# Patient Record
Sex: Female | Born: 1985 | Race: Black or African American | Hispanic: No | Marital: Single | Smoking: Current every day smoker
Health system: Southern US, Community
[De-identification: ages and names within clinical notes are randomized; demographics above are authoritative.]

## PROBLEM LIST (undated history)

## (undated) VITALS — BP 90/50 | HR 80 | Temp 98.0°F | Resp 18 | Ht 68.0 in | Wt 178.0 lb

## (undated) DIAGNOSIS — F419 Anxiety disorder, unspecified: Secondary | ICD-10-CM

## (undated) DIAGNOSIS — M25579 Pain in unspecified ankle and joints of unspecified foot: Secondary | ICD-10-CM

## (undated) DIAGNOSIS — F32A Depression, unspecified: Secondary | ICD-10-CM

## (undated) DIAGNOSIS — K838 Other specified diseases of biliary tract: Secondary | ICD-10-CM

## (undated) DIAGNOSIS — F4310 Post-traumatic stress disorder, unspecified: Secondary | ICD-10-CM

## (undated) DIAGNOSIS — F418 Other specified anxiety disorders: Secondary | ICD-10-CM

## (undated) DIAGNOSIS — F329 Major depressive disorder, single episode, unspecified: Secondary | ICD-10-CM

## (undated) DIAGNOSIS — R768 Other specified abnormal immunological findings in serum: Secondary | ICD-10-CM

## (undated) DIAGNOSIS — F1910 Other psychoactive substance abuse, uncomplicated: Secondary | ICD-10-CM

## (undated) DIAGNOSIS — G8929 Other chronic pain: Secondary | ICD-10-CM

## (undated) HISTORY — PX: APPENDECTOMY: SHX54

## (undated) HISTORY — PX: TUBAL LIGATION: SHX77

## (undated) HISTORY — PX: ANKLE SURGERY: SHX546

---
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
## 2004-01-05 ENCOUNTER — Inpatient Hospital Stay (HOSPITAL_COMMUNITY): Admission: AD | Admit: 2004-01-05 | Discharge: 2004-01-05 | Payer: Self-pay | Admitting: Obstetrics

## 2004-02-18 ENCOUNTER — Inpatient Hospital Stay (HOSPITAL_COMMUNITY): Admission: AD | Admit: 2004-02-18 | Discharge: 2004-02-19 | Payer: Self-pay | Admitting: Obstetrics

## 2004-03-14 ENCOUNTER — Inpatient Hospital Stay (HOSPITAL_COMMUNITY): Admission: AD | Admit: 2004-03-14 | Discharge: 2004-03-16 | Payer: Self-pay | Admitting: Obstetrics

## 2004-05-18 ENCOUNTER — Emergency Department (HOSPITAL_COMMUNITY): Admission: EM | Admit: 2004-05-18 | Discharge: 2004-05-18 | Payer: Self-pay | Admitting: Emergency Medicine

## 2004-07-01 ENCOUNTER — Encounter (INDEPENDENT_AMBULATORY_CARE_PROVIDER_SITE_OTHER): Payer: Self-pay | Admitting: *Deleted

## 2004-07-11 ENCOUNTER — Encounter: Admission: RE | Admit: 2004-07-11 | Discharge: 2004-07-11 | Payer: Self-pay | Admitting: Family Medicine

## 2004-07-22 ENCOUNTER — Encounter: Admission: RE | Admit: 2004-07-22 | Discharge: 2004-07-22 | Payer: Self-pay | Admitting: Family Medicine

## 2004-07-22 ENCOUNTER — Other Ambulatory Visit: Admission: RE | Admit: 2004-07-22 | Discharge: 2004-07-22 | Payer: Self-pay | Admitting: Family Medicine

## 2004-07-31 ENCOUNTER — Encounter: Admission: RE | Admit: 2004-07-31 | Discharge: 2004-07-31 | Payer: Self-pay | Admitting: Family Medicine

## 2004-08-12 ENCOUNTER — Ambulatory Visit: Payer: Self-pay | Admitting: Family Medicine

## 2004-09-03 ENCOUNTER — Ambulatory Visit: Payer: Self-pay | Admitting: Family Medicine

## 2004-10-21 ENCOUNTER — Ambulatory Visit: Payer: Self-pay | Admitting: Family Medicine

## 2004-10-23 ENCOUNTER — Emergency Department (HOSPITAL_COMMUNITY): Admission: EM | Admit: 2004-10-23 | Discharge: 2004-10-23 | Payer: Self-pay | Admitting: Emergency Medicine

## 2004-11-13 ENCOUNTER — Ambulatory Visit: Payer: Self-pay | Admitting: Family Medicine

## 2007-01-28 DIAGNOSIS — F329 Major depressive disorder, single episode, unspecified: Secondary | ICD-10-CM

## 2007-01-28 DIAGNOSIS — F17200 Nicotine dependence, unspecified, uncomplicated: Secondary | ICD-10-CM

## 2007-01-29 ENCOUNTER — Encounter (INDEPENDENT_AMBULATORY_CARE_PROVIDER_SITE_OTHER): Payer: Self-pay | Admitting: *Deleted

## 2007-05-12 ENCOUNTER — Inpatient Hospital Stay (HOSPITAL_COMMUNITY): Admission: AD | Admit: 2007-05-12 | Discharge: 2007-05-12 | Payer: Self-pay | Admitting: Obstetrics and Gynecology

## 2010-11-08 ENCOUNTER — Emergency Department (HOSPITAL_COMMUNITY)
Admission: EM | Admit: 2010-11-08 | Discharge: 2010-11-08 | Payer: Self-pay | Source: Home / Self Care | Admitting: Emergency Medicine

## 2011-01-17 ENCOUNTER — Emergency Department (HOSPITAL_COMMUNITY)
Admission: EM | Admit: 2011-01-17 | Discharge: 2011-01-17 | Disposition: A | Discharge status: Home / Self Care | Payer: Self-pay | Attending: Emergency Medicine | Admitting: Emergency Medicine

## 2011-01-17 DIAGNOSIS — M7989 Other specified soft tissue disorders: Secondary | ICD-10-CM | POA: Insufficient documentation

## 2011-01-17 DIAGNOSIS — IMO0002 Reserved for concepts with insufficient information to code with codable children: Secondary | ICD-10-CM | POA: Insufficient documentation

## 2011-01-17 DIAGNOSIS — L299 Pruritus, unspecified: Secondary | ICD-10-CM | POA: Insufficient documentation

## 2011-01-17 DIAGNOSIS — M79609 Pain in unspecified limb: Secondary | ICD-10-CM | POA: Insufficient documentation

## 2011-01-20 ENCOUNTER — Inpatient Hospital Stay (HOSPITAL_COMMUNITY)
Admission: EM | Admit: 2011-01-20 | Discharge: 2011-01-24 | DRG: 603 | Disposition: A | Discharge status: Home / Self Care | Payer: Non-veteran care | Attending: Internal Medicine | Admitting: Internal Medicine

## 2011-01-20 ENCOUNTER — Emergency Department (HOSPITAL_COMMUNITY)
Admission: EM | Admit: 2011-01-20 | Discharge: 2011-01-20 | Discharge status: Against Medical Advice | Payer: Non-veteran care | Attending: Emergency Medicine | Admitting: Emergency Medicine

## 2011-01-20 DIAGNOSIS — F17200 Nicotine dependence, unspecified, uncomplicated: Secondary | ICD-10-CM | POA: Diagnosis present

## 2011-01-20 DIAGNOSIS — E876 Hypokalemia: Secondary | ICD-10-CM | POA: Diagnosis present

## 2011-01-20 DIAGNOSIS — D649 Anemia, unspecified: Secondary | ICD-10-CM | POA: Diagnosis present

## 2011-01-20 DIAGNOSIS — D473 Essential (hemorrhagic) thrombocythemia: Secondary | ICD-10-CM | POA: Diagnosis present

## 2011-01-20 DIAGNOSIS — Z880 Allergy status to penicillin: Secondary | ICD-10-CM

## 2011-01-20 DIAGNOSIS — F1010 Alcohol abuse, uncomplicated: Secondary | ICD-10-CM | POA: Diagnosis present

## 2011-01-20 DIAGNOSIS — IMO0002 Reserved for concepts with insufficient information to code with codable children: Principal | ICD-10-CM | POA: Diagnosis present

## 2011-01-20 DIAGNOSIS — F1410 Cocaine abuse, uncomplicated: Secondary | ICD-10-CM | POA: Diagnosis present

## 2011-01-21 ENCOUNTER — Encounter (HOSPITAL_COMMUNITY): Payer: Self-pay | Admitting: Radiology

## 2011-01-21 ENCOUNTER — Emergency Department (HOSPITAL_COMMUNITY): Payer: Non-veteran care

## 2011-01-21 LAB — MRSA PCR SCREENING: MRSA by PCR: NEGATIVE

## 2011-01-21 LAB — CBC
HCT: 38.8 % (ref 36.0–46.0)
Hemoglobin: 13.6 g/dL (ref 12.0–15.0)
MCH: 30.7 pg (ref 26.0–34.0)
MCHC: 35.1 g/dL (ref 30.0–36.0)
MCV: 87.6 fL (ref 78.0–100.0)
RDW: 12.4 % (ref 11.5–15.5)

## 2011-01-21 LAB — POCT I-STAT, CHEM 8
Calcium, Ion: 1.1 mmol/L — ABNORMAL LOW (ref 1.12–1.32)
Creatinine, Ser: 1 mg/dL (ref 0.4–1.2)
Glucose, Bld: 113 mg/dL — ABNORMAL HIGH (ref 70–99)
HCT: 40 % (ref 36.0–46.0)
Hemoglobin: 13.6 g/dL (ref 12.0–15.0)
Potassium: 2.7 mEq/L — CL (ref 3.5–5.1)
TCO2: 21 mmol/L (ref 0–100)

## 2011-01-21 LAB — RAPID URINE DRUG SCREEN, HOSP PERFORMED
Amphetamines: NOT DETECTED
Tetrahydrocannabinol: NOT DETECTED

## 2011-01-21 LAB — DIFFERENTIAL
Basophils Absolute: 0.1 10*3/uL (ref 0.0–0.1)
Eosinophils Relative: 0 % (ref 0–5)
Lymphocytes Relative: 21 % (ref 12–46)
Monocytes Absolute: 0.9 10*3/uL (ref 0.1–1.0)
Monocytes Relative: 5 % (ref 3–12)

## 2011-01-21 MED ORDER — IOHEXOL 300 MG/ML  SOLN
100.0000 mL | Freq: Once | INTRAMUSCULAR | Status: AC | PRN
  Administered 2011-01-21: 100 mL via INTRAVENOUS

## 2011-01-22 ENCOUNTER — Inpatient Hospital Stay (HOSPITAL_COMMUNITY): Payer: Non-veteran care

## 2011-01-22 DIAGNOSIS — IMO0002 Reserved for concepts with insufficient information to code with codable children: Secondary | ICD-10-CM

## 2011-01-22 LAB — CBC
HCT: 31.6 % — ABNORMAL LOW (ref 36.0–46.0)
Hemoglobin: 11.1 g/dL — ABNORMAL LOW (ref 12.0–15.0)
Hemoglobin: 10.6 g/dL — ABNORMAL LOW (ref 12.0–15.0)
MCH: 28.9 pg (ref 26.0–34.0)
MCV: 88.8 fL (ref 78.0–100.0)
Platelets: 420 10*3/uL — ABNORMAL HIGH (ref 150–400)
RBC: 3.84 MIL/uL — ABNORMAL LOW (ref 3.87–5.11)
WBC: 17 10*3/uL — ABNORMAL HIGH (ref 4.0–10.5)
WBC: 11.8 10*3/uL — ABNORMAL HIGH (ref 4.0–10.5)

## 2011-01-22 LAB — BASIC METABOLIC PANEL
CO2: 29 mEq/L (ref 19–32)
Chloride: 103 mEq/L (ref 96–112)
Creatinine, Ser: 0.95 mg/dL (ref 0.4–1.2)
GFR calc Af Amer: >60 mL/min (ref >60)
Sodium: 143 mEq/L (ref 135–145)

## 2011-01-22 LAB — HIV ANTIBODY (ROUTINE TESTING W REFLEX): HIV: NONREACTIVE

## 2011-01-23 LAB — CBC
HCT: 30.5 % — ABNORMAL LOW (ref 36.0–46.0)
Hemoglobin: 10.1 g/dL — ABNORMAL LOW (ref 12.0–15.0)
MCH: 29.4 pg (ref 26.0–34.0)
MCHC: 33.1 g/dL (ref 30.0–36.0)
MCV: 88.9 fL (ref 78.0–100.0)
RBC: 3.43 MIL/uL — ABNORMAL LOW (ref 3.87–5.11)

## 2011-01-23 LAB — COMPREHENSIVE METABOLIC PANEL
AST: 24 U/L (ref 0–37)
BUN: 5 mg/dL — ABNORMAL LOW (ref 6–23)
CO2: 29 mEq/L (ref 19–32)
Chloride: 104 mEq/L (ref 96–112)
Creatinine, Ser: 0.79 mg/dL (ref 0.4–1.2)
GFR calc Af Amer: >60 mL/min (ref >60)
GFR calc non Af Amer: >60 mL/min (ref >60)
Glucose, Bld: 129 mg/dL — ABNORMAL HIGH (ref 70–99)
Total Bilirubin: 0.4 mg/dL (ref 0.3–1.2)

## 2011-01-23 LAB — MAGNESIUM: Magnesium: 2.3 mg/dL (ref 1.5–2.5)

## 2011-01-24 LAB — COMPREHENSIVE METABOLIC PANEL
AST: 26 U/L (ref 0–37)
Albumin: 3.1 g/dL — ABNORMAL LOW (ref 3.5–5.2)
Calcium: 9.1 mg/dL (ref 8.4–10.5)
Chloride: 102 mEq/L (ref 96–112)
Creatinine, Ser: 0.75 mg/dL (ref 0.4–1.2)
GFR calc Af Amer: >60 mL/min (ref >60)
Total Protein: 7.1 g/dL (ref 6.0–8.3)

## 2011-01-24 LAB — CBC
MCH: 29.5 pg (ref 26.0–34.0)
MCHC: 33.6 g/dL (ref 30.0–36.0)
Platelets: 481 10*3/uL — ABNORMAL HIGH (ref 150–400)
RBC: 3.76 MIL/uL — ABNORMAL LOW (ref 3.87–5.11)

## 2011-01-24 LAB — DIFFERENTIAL
Basophils Absolute: 0 10*3/uL (ref 0.0–0.1)
Basophils Relative: 0 % (ref 0–1)
Eosinophils Absolute: 0 10*3/uL (ref 0.0–0.7)
Monocytes Relative: 4 % (ref 3–12)
Neutrophils Relative %: 79 % — ABNORMAL HIGH (ref 43–77)

## 2011-01-24 NOTE — Op Note (Signed)
[name], [name]                  ACCOUNT NO.:  [id number]  MEDICAL RECORD NO.:  [id number]           PATIENT TYPE:  I  LOCATION:  5019                         FACILITY:  [facility]  PHYSICIAN:  Vanita Panda. Magnus Ivan, M.D.DATE OF BIRTH:  Mar 23, 1986  DATE OF PROCEDURE:  01/21/2011 DATE OF DISCHARGE:                              OPERATIVE REPORT   PREOPERATIVE DIAGNOSIS:  Right arm antecubital fossa abscess.  POSTOPERATIVE DIAGNOSIS:  Right arm antecubital fossa abscess.  PROCEDURE:  Irrigation and debridement of right arm antecubital fossa abscess with exploration of surrounding structures.  SURGEON:  Vanita Panda. Magnus Ivan, MD  ANESTHESIA:  General.  ESTIMATED BLOOD LOSS:  Less than 50 mL.  TOURNIQUET TIME:  30 minutes.  COMPLICATIONS:  None.  INDICATIONS:  [name] is a 25 year old female with the history of polysubstance abuse.  She had been injecting narcotics in to her right arm at the antecubital fossa for a while now and has developed an abscess in this area.  A CT scan showed large soft tissue abscess as well.  She had severe pain and white blood cell count of 18,000 peripherally.  It was recommended that she undergo urgent irrigation and debridement of the antecubital fossa.  I explained the risks and benefits of this to her in detail, and she understood the reason behind proceeding with surgery and understood this was from infection from her IV drugs.  She is on the hospitalist service and already receiving IV antibiotics.  PROCEDURE DESCRIPTION:  After informed consent was obtained, appropriate right arm was marked.  She was brought to the operating room, placed supine on the operating table.  General anesthesia was then obtained. The right arm was placed on the arm table.  Her right arm was prepped and draped with DuraPrep and sterile drapes.  A sterile tourniquet was also applied.  A time-out was called and she identified the correct patient and correct  right arm.  I had the tourniquet elevated to 250 mmHg.  I then made incision directly over the antecubital fossa in the transverse direction and carried this approximately medial and distal lateral.  A large soft tissue abscess was encountered and cultures were obtained.  I then further divided the tissue to expose the biceps tendon, which was intact.  I did not find any necrotic muscle yet and did find a large area of abscess.  The median nerve was intact as well as the brachial artery.  Using pulsatile lavage, I then thoroughly lavaged the entire antecubital fossa with the pulsatile lavage including the skin superficial and deep tissues.  I then used further irrigation with bulb syringe so at least 4 L of solution was irrigated through this area.  I then loosely reapproximated the skin.  Xeroform followed well- padded sterile dressing was applied.  Of note, she did show any evidence of compartment syndrome preoperatively.  She was awakened, extubated, and taken to the recovery room in stable condition.  Postoperatively, she remained on IV antibiotics with close observation and potentially return trip to the OR in 24-48 hours if this is worsening in any way.  Vanita Panda. Magnus Ivan, M.D.     CYB/MEDQ  D:  01/21/2011  T:  01/22/2011  Job:  [id number]  Electronically Signed by Doneen Poisson M.D. on 01/24/2011 08:15:54 PM

## 2011-01-24 NOTE — Op Note (Signed)
  [name], [name]                  ACCOUNT NO.:  [id number]  MEDICAL RECORD NO.:  [id number]           PATIENT TYPE:  I  LOCATION:  5019                         FACILITY:  [facility]  PHYSICIAN:  Vanita Panda. Magnus Ivan, M.D.DATE OF BIRTH:  [birth year]  DATE OF PROCEDURE:  01/23/2011 DATE OF DISCHARGE:                              OPERATIVE REPORT   PREOPERATIVE DIAGNOSIS:  Right arm antecubital fossa abscess, status post irrigation and debridement x1.  POSTOPERATIVE DIAGNOSIS:  Right arm antecubital fossa abscess, status post irrigation and debridement x1.  PROCEDURE:  Repeat irrigation and debridement of right arm wound and abscess in the antecubital fossa.  FINDINGS:  Minimal amount of purulent material in antecubital fossa, much improved from previous I and Ds.  SURGEON:  Vanita Panda. Magnus Ivan, MD  ANESTHESIA:  General.  ESTIMATED BLOOD LOSS:  Minimal.  COMPLICATIONS:  None.  INDICATIONS:  [name] is a 25 year old patient who taken to the operating room on January 21, 2011, after being found to have abscess in antecubital fossa secondary to IV drug abuse.  She has now been in the hospital for few days on IV antibiotics as this did grew out gram positive cocci.  We returned to the OR today and her white blood cell count is improved dramatically.  She is still having pain this area and we need to look at this one more time due to the gross infection that we found the first time.  PROCEDURE DESCRIPTION:  After informed consent was obtained, appropriate right arm was marked.  She was brought to the operating room, placed supine on the operating table.  The right arm was placed on the arm table.  General anesthesia was then obtained.  Her right arm was prepped and draped with DuraPrep and sterile drapes.  A time-out was called and she identified the correct patient and correct right arm.  I removed the sutures in the antecubital fossa and opened up wound  extensively. Again, I did not find significant necrosis, but I still found some slight milky material suggesting continued infection.  Using pulsatile lavage, I lavaged 3 L of normal saline solution through the wound followed by bulb syringe irrigation of 500 mL of bacitracin solution.  I then loosely reapproximated the skin again with interrupted 2-0 nylon suture. Xeroform followed well-padded sterile dressing was applied.  The patient was awakened, extubated, and taken to the recovery room in stable condition.  All final counts were correct and there were no complications noted.     Vanita Panda. Magnus Ivan, M.D.     CYB/MEDQ  D:  01/23/2011  T:  01/24/2011  Job:  [id number]  Electronically Signed by Doneen Poisson M.D. on 01/24/2011 08:15:56 PM

## 2011-01-25 LAB — CULTURE, ROUTINE-ABSCESS

## 2011-01-25 NOTE — Discharge Summary (Signed)
NAMEJARED, [name]                  ACCOUNT NO.:  [id number]  MEDICAL RECORD NO.:  [id number]           PATIENT TYPE:  I  LOCATION:  5019                         FACILITY:  [facility]  PHYSICIAN:  Talmage Nap, MD  DATE OF BIRTH:  Mar 15, 1986  DATE OF ADMISSION:  01/20/2011 DATE OF DISCHARGE:  01/24/2011                        DISCHARGE SUMMARY - REFERRING   PRIMARY CARE PHYSICIAN:  Unassigned.  CONSULTANT:  Involving the case is Orthopedic Surgery, Vanita Panda. [name], M.D.  DISCHARGE DIAGNOSES: 1. Right antecubital fossa abscess and cellulitis, status post     irrigation and debridement with exploration of surrounding tissue     x2. 2. Polysubstance abuse. 3. History of posttraumatic stress disorder. 4. Anemia. 5. Thrombocytosis most likely secondary to anemia.  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  This was said to be associated with fever and chills.  Symptoms were said to be getting progressively worse, hence the patient presented to the emergency room to be evaluated.  Her preadmission med include doxycycline.  ALLERGIES:  PENICILLIN.  PAST SURGICAL HISTORY:  Right ankle surgery.  SOCIAL HISTORY:  The patient smokes about a pack of cigarettes per day and does street drugs, which includes cocaine.  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  FAMILY HISTORY:  Negative for any systemic illness.  REVIEW OF SYSTEMS:  Essentially documented in the initial history and physical.  PHYSICAL EXAMINATION:  At time the patient was seen by the admitting physician, VITAL SIGNS:  Temperature was 99.1, blood pressure 148/82, pulse rate 106, respiratory rate 18, and saturating 98% on room air. HEENT:  Pupils were reactive to light and extraocular muscles intact and she had mild  pallor. NECK:  No jugular venous distention.  No carotid bruit.  No lymphadenopathy. CHEST:  Clear to auscultation. HEART:  Sounds are one and two. ABDOMEN:  Soft, nontender.  Liver, spleen, and kidney not palpable. Bowel sounds are positive. EXTREMITIES:  No pedal edema. NEUROLOGIC:  Nonfocal. MUSCULOSKELETAL:  Swelling in the right antecubital fossa with a lot of induration and tenderness with decreased range of movement. NEUROPSYCHIATRIC:  Unremarkable.  LABORATORY DATA:  Initial complete blood count and differential showed WBC of 18.2, hemoglobin of 13.6, hematocrit of 38.8, MCV 87.6 with a platelet count of 457.  Chem-8 stat showed sodium of 138, potassium of 2.7, chloride of 101, glucose is 113, BUN is 3, and creatinine is 1.0. Urine drug screen was positive for cocaine and opiates.  Routine MRSA screening negative.  Complete blood count with differential done on January 22, 2011, showed WBC of 17.0, hemoglobin of 11.1, hematocrit of 33.7, MCV 87.8 with a platelet count of 420 and a basic metabolic panel showed sodium of 143, potassium of 3.8, chloride of 103 with a bicarb of 29, glucose is 99, BUN is 3, creatinine is 0.95.  Blood culture no growth x2.  Culture from the right elbow abscess grew gram-positive cocci in pairs.  No anaerobe seen.  HIV test, nonreactive.  RPR nonreactive.  A repeat complete blood count with differential done on January 24, 2011, showed WBC of 13.4, hemoglobin 11.1, hematocrit 33.0, MCV 87.8 with a platelet count of 481.  A comprehensive metabolic panel showed sodium of 138, potassium of 4.1, chloride of 102 with a bicarb of 27, glucose is 147, BUN is 7, creatinine 0.75, and magnesium level is 2.2.  IMAGING STUDIES:  Done include CT of the right elbow, which showed abscess involving the subcutaneous tissue as well as the brachioradialis muscles and the cephalic vein with extensive adjacent cellulitis.  Chest x-ray showed PICC-line over cavoatrial  junction.  HOSPITAL COURSE:  The patient was admitted to a general medical floor. She was started on IV antibiotics, IV vancomycin, cefepime as well as clindamycin and dosing was done by pharmacy.  Pain control was done with morphine 2-4 mg IV q.4 h. p.r.n.  Orthopedic surgeon was consulted, Dr. Doneen Poisson evaluated the patient and subsequently the patient had incision and drainage done x2.  Postoperatively, the patient had thin bandage applied to the left arm and this was elevated.  She was followed and evaluated on daily basis and makes remarkable progress i.e. swelling was reduced and WBC count showed down trend.  So far, the patient has remained clinically stable.  She was reevaluated by the orthopedic surgeon who recommended that the patient should be discharged.  The patient was also seen by me today.  Denies any complaint.  Swelling in the right elbow is reduced.  Vital signs, stable i.e. temperature is 98.9, pulse is 113, respiratory 18, blood pressure is 153/74 repeat was 138/74, medically stable.  Plan is for the patient to be discharged home today on activity as tolerated.  Low-sodium, low- cholesterol diet.  Alcohol, smoking and street drug use cessation. She will be followed up by Dr. Doneen Poisson of [facility] on January 09, 2011.  She will call for appointment, phone number is [phone].  Dressing would be removed in 2 days and then a large bandage with gauze will be placed over the incision.  DISCHARGE MEDICATIONS:  Medication to be taken at home will include, 1. Doxycycline 100 mg one p.o. b.i.d. given by orthopedic surgeon. 2. Percocet 5/325 one to two tablets p.o. q.4-6 h. p.r.n. given by     orthopedic surgeon. 3. Motrin 800 mg one p.o. b.i.d./t.i.d. with meals also given by     orthopedic surgery. 4. However, the patient will also be given Bactrim double strength 1     p.o. b.i.d. for the next 14 days.  A copy of this discharge  summary will be made available to the patient's primary care physician.    Talmage Nap, MD    CN/MEDQ  D:  01/24/2011  T:  01/24/2011  Job:  [id number]  Electronically Signed by Talmage Nap  on 01/25/2011 06:32:41 AM

## 2011-01-26 LAB — ANAEROBIC CULTURE

## 2011-01-27 LAB — CULTURE, BLOOD (ROUTINE X 2)
Culture  Setup Time: 201202211056
Culture: NO GROWTH

## 2011-02-06 NOTE — H&P (Signed)
[name], [name]                  ACCOUNT NO.:  [id number]  MEDICAL RECORD NO.:  [id number]           PATIENT TYPE:  LOCATION:                                 FACILITY:  PHYSICIAN:  Lonia Blood, M.D.      DATE OF BIRTH:  [birth year]  DATE OF ADMISSION:  01/21/2011 DATE OF DISCHARGE:                             HISTORY & PHYSICAL   PRIMARY CARE PHYSICIAN:  She is unassigned to [place], she goes to [place].  PRESENTING COMPLAINT:  Right elbow pain and swelling.  HISTORY OF PRESENT ILLNESS:  The patient is a 25 year old veteran with history of polysubstance abuse that presented with swelling, tenderness, and fever.  The swelling and tenderness involve her right elbow.  She has agreed to using a cane.  Also other IV drugs.  She noted the swelling started over 2 days ago, has progressively gotten worse, pain is 10/10, worsen with any movement, not relieved by anything.  She has also had fevers and chills.  She denied any prior history of similar findings.  PAST MEDICAL HISTORY:  Significant for: 1. Post-traumatic stress disorder. 2. History of right ankle surgery.  ALLERGIES:  PENICILLIN.  CURRENT MEDICATIONS:  She was started on doxycycline as an outpatient which does not seem to be helping her.  SOCIAL HISTORY:  The patient is an [place] [employer], clinical.  She smokes about a half pack per day, uses cocaine as well as other alcohol.  She also uses IV drugs.  She is an occasional alcohol drinker.  FAMILY HISTORY:  Denied any family history for substance abuse.  REVIEW OF SYSTEMS:  All systems reviewed are currently negative except per HPI.  PHYSICAL EXAMINATION:  VITAL SIGNS:  Temperature is 99.1, blood pressure 148/82, pulse 116, respiratory rate 18, her sats 98% on room air. GENERAL:  She is awake, alert, in obvious distress due to pain. HEENT:  PERRL, EOMI.  No pallor.  No jaundice.  No rhinorrhea. NECK:  Supple.  No JVD.  No lymphadenopathy. RESPIRATORY:  She has good air entry  bilaterally.  No wheezes, no rales, no crackles. CARDIOVASCULAR SYSTEM:  She has S1, S2.  No murmur. ABDOMEN:  Soft full, nontender with positive bowel sounds. EXTREMITIES:  No edema, cyanosis, or clubbing.  Her right elbow is markedly swollen, tender with an indurated area anterolaterally, tender to touch, red and warm.  Not easily fluctuant. SKIN:  Otherwise no rashes or ulcers.  LABORATORY DATA:  Her white count is 18.2 with a left shift ANC of 13.4, hemoglobin 13.6, platelet 457.  Calcium is 1.1.  Hemoglobin 13.6. Sodium 138, potassium 2.7, chloride 101, CO2 of 21, glucose 113, BUN 3, and creatinine 1.0.  Urine drug screen is positive for cocaine and opiates.  CT of the right elbow now showed 20 x 28-mm peripheral enhancing fluid collection within the antecubital fossa consistent with early abscess, also evidence of cellulitis.  ASSESSMENT:  This a 25 year old female with history of drug abuse, presenting with right elbow cellulitis with abscess as well as hypokalemia while positive for cocaine and opiates.  PLAN: 1. Right elbow cellulitis abscess.  We  will consult Orthopedics for     possible I and D.  In the meantime, I will start her on vancomycin     and also add cefepime since she is PENICILLIN allergic.  Pain     control, we will elevate the arm and probably warm compress. 2. Hypokalemia.  We will replete her potassium appropriately. 3. History of PTSD.  She seems stable not on any medication at this     point. 4. Polysubstance abuse.  The patient in tears and says that she will     require help and she will need help, she will quit use.  We will     continue with cessation counseling.     Lonia Blood, M.D.     Verlin Grills  D:  01/21/2011  T:  01/21/2011  Job:  [id number]  Electronically Signed by Lonia Blood M.D. on 02/06/2011 06:30:37 AM

## 2011-02-11 LAB — URINALYSIS, ROUTINE W REFLEX MICROSCOPIC
Glucose, UA: NEGATIVE mg/dL
Hgb urine dipstick: NEGATIVE
Specific Gravity, Urine: 1.016 (ref 1.005–1.030)
pH: 6 (ref 5.0–8.0)

## 2011-02-11 LAB — URINE MICROSCOPIC-ADD ON

## 2011-02-11 LAB — WET PREP, GENITAL: Yeast Wet Prep HPF POC: NONE SEEN

## 2011-02-11 LAB — GC/CHLAMYDIA PROBE AMP, GENITAL: Chlamydia, DNA Probe: POSITIVE — AB

## 2011-04-18 NOTE — Op Note (Signed)
[place]  Patient:    [name], [name]                    MRN: [id number] Proc. Date: 09/11/00 Adm. Date:  [id number] Disc. Date: [id number] Attending:  [place] Pho Damodar CC:         Dr. Tresa Garter  Dr. Judeen Hammans at [facility]  Dr. Rudene Re at Radiology Department   Operative Report  PREOPERATIVE DIAGNOSIS:  Acute abdomen, possible appendicitis.  POSTOPERATIVE DIAGNOSIS: 1.  Early appendicitis of distal end of appendix. 2.  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 3.  Few ccs of free blood in the pelvic cavity.  OPERATION PERFORMED:  Laparoscopic appendectomy.  SURGEON:  Dr. Levie Heritage.  ASSISTANT:  Dr. Leeanne Mannan.  ANESTHESIA:  General endotracheal.  OPERATIVE INDICATION:  This 25 year old girl was admitted with about 12 hours history of persistent bilious vomiting, abdominal pain, and few fainting spells.  There was no history of URI, no dysuria, no vaginal discharge, and no abdominal trauma.  Her last period started today.  Abdominal examination showed lower abdominal tenderness without any guarding or rigidity.  Her white count was 19,400 with shift to the left.  Urinalysis showed a few WBCs and RBCs.  CT scan of the abdomen revealed possible appendicitis with fluid in the pelvic cavity.  Diagnosis of possible early appendicitis was made and the patient was taken to the operating room.  OPERATIVE FINDINGS:  Laparoscopic exploration reveals small quantity of bloody fluid in the pelvic cavity.  The uterus and the tubes appear normal.  The right ovary was normal.  The left ovary showed a small indentation in the capsule with reddish floored abscess consistent with recently ruptured graafian follicle.  There was no evidence of gross inflammation in relation to the tubes and ovaries.  Examination of the appendix area showed distal one inch of the appendix edematous, somewhat turbid and congested consistent with early  appendicitis of the distal end.  The remainder of the appendix, cecum, distal ileum were unremarkable.  Examination of the distal ileum showed no evidence of Meckels diverticulum.  OPERATIVE PROCEDURE:  Under satisfactory general endotracheal anesthesia, the patient was in the supine position, abdomen was prepped and draped in the usual manner.  Insular umbilical horizontal incision was made and carried through the left of abdominal wall and peritoneal cavity was entered with some difficulty.  Hasson laparoscopic port was introduced through this incision and telescope was inserted.  Careful examination showed the findings as described above.  At this time, a 5 mm port was placed through the right midabdominal area through which a grasper was passed and the appendix was held under tension and a 10 mm port was placed through the left lower quadrant area through which another dissector was passed and appendical mesentery was dissected to free the base of the appendix from the mesentery.  There were a few other adhesions in connection with the appendix and the parietal peritoneum which were lysed.  Now the appendical base was clamped and cut with stapler.  The appendix was held once again under traction and the appendical mesentery likewise was cut with stapler; hemostasis was satisfactory.  The old blood the pelvic cavity was aspirated and collected for culture examination.  The area was irrigated and checked for hemostasis.  Examination of the distal ileum as well as once again the pelvic cavity was carried out and no other pathological lesions were noted.  All of the free  fluid was aspirated and the ports were removed.  All the wounds were closed in layers, the deep layers with 2-0 Vicryl and interrupted sutures, subcutaneous tissues closed with 3-0 Vicryl and the skin was closed with staples, and appropriate dressings were applied.  Throughout the procedure, the patients vital  signs remained stable.  The patient tolerated the procedure well and was transferred to the recovery room in satisfactory general condition. DD:  09/11/00 TD:  09/13/00 Job: [id number]

## 2011-09-18 LAB — URINALYSIS, ROUTINE W REFLEX MICROSCOPIC
Glucose, UA: NEGATIVE
Ketones, ur: NEGATIVE
Nitrite: NEGATIVE
Protein, ur: NEGATIVE
pH: 5.5

## 2011-12-15 ENCOUNTER — Encounter (HOSPITAL_COMMUNITY): Payer: Self-pay | Admitting: *Deleted

## 2011-12-15 ENCOUNTER — Emergency Department (HOSPITAL_COMMUNITY)
Admission: EM | Admit: 2011-12-15 | Discharge: 2011-12-16 | Disposition: A | Discharge status: Home / Self Care | Payer: Non-veteran care | Attending: Emergency Medicine | Admitting: Emergency Medicine

## 2011-12-15 DIAGNOSIS — Z202 Contact with and (suspected) exposure to infections with a predominantly sexual mode of transmission: Secondary | ICD-10-CM

## 2011-12-15 DIAGNOSIS — Z9189 Other specified personal risk factors, not elsewhere classified: Secondary | ICD-10-CM | POA: Insufficient documentation

## 2011-12-15 DIAGNOSIS — A499 Bacterial infection, unspecified: Secondary | ICD-10-CM | POA: Insufficient documentation

## 2011-12-15 DIAGNOSIS — L02414 Cutaneous abscess of left upper limb: Secondary | ICD-10-CM

## 2011-12-15 DIAGNOSIS — IMO0002 Reserved for concepts with insufficient information to code with codable children: Secondary | ICD-10-CM | POA: Insufficient documentation

## 2011-12-15 DIAGNOSIS — B9689 Other specified bacterial agents as the cause of diseases classified elsewhere: Secondary | ICD-10-CM | POA: Insufficient documentation

## 2011-12-15 DIAGNOSIS — N760 Acute vaginitis: Secondary | ICD-10-CM | POA: Insufficient documentation

## 2011-12-15 DIAGNOSIS — M7989 Other specified soft tissue disorders: Secondary | ICD-10-CM | POA: Insufficient documentation

## 2011-12-15 LAB — URINALYSIS, ROUTINE W REFLEX MICROSCOPIC
Ketones, ur: 15 mg/dL — AB
Nitrite: NEGATIVE
Specific Gravity, Urine: 1.021 (ref 1.005–1.030)
Urobilinogen, UA: 1 mg/dL (ref 0.0–1.0)
pH: 5.5 (ref 5.0–8.0)

## 2011-12-15 LAB — URINE MICROSCOPIC-ADD ON

## 2011-12-15 NOTE — ED Notes (Signed)
The pt has multiple symptoms.  She has an infection in her lt forearm red draining..  C/o chest pain  Migraine headache and she thinsk she may hjave a vaginal infection.  Symptoms for 2 weeks intermittently

## 2011-12-16 LAB — WET PREP, GENITAL: Yeast Wet Prep HPF POC: NONE SEEN

## 2011-12-16 LAB — URINE CULTURE
Colony Count: 80000
Culture  Setup Time: 201301150449

## 2011-12-16 MED ORDER — DOXYCYCLINE HYCLATE 100 MG PO CAPS: 100.0000 mg | ORAL_CAPSULE | Freq: Two times a day (BID) | ORAL | Status: AC

## 2011-12-16 MED ORDER — LIDOCAINE HCL 2 % IJ SOLN
10.0000 mL | Freq: Once | INTRAMUSCULAR | Status: AC
  Administered 2011-12-16: 200 mg via INTRADERMAL
  Filled 2011-12-16: qty 1

## 2011-12-16 MED ORDER — OXYCODONE-ACETAMINOPHEN 5-325 MG PO TABS
1.0000 | ORAL_TABLET | Freq: Once | ORAL | Status: AC
  Administered 2011-12-16: 1 via ORAL
  Filled 2011-12-16: qty 1

## 2011-12-16 MED ORDER — OXYCODONE-ACETAMINOPHEN 5-325 MG PO TABS: 2.0000 | ORAL_TABLET | ORAL | Status: AC | PRN

## 2011-12-16 MED ORDER — CEFTRIAXONE SODIUM 250 MG IJ SOLR
250.0000 mg | Freq: Once | INTRAMUSCULAR | Status: AC
  Administered 2011-12-16: 250 mg via INTRAMUSCULAR
  Filled 2011-12-16: qty 250

## 2011-12-16 MED ORDER — DOXYCYCLINE HYCLATE 100 MG PO TABS
100.0000 mg | ORAL_TABLET | Freq: Once | ORAL | Status: AC
  Administered 2011-12-16: 100 mg via ORAL
  Filled 2011-12-16: qty 1

## 2011-12-16 MED ORDER — OXYCODONE-ACETAMINOPHEN 5-325 MG PO TABS
2.0000 | ORAL_TABLET | Freq: Once | ORAL | Status: AC
  Administered 2011-12-16: 2 via ORAL
  Filled 2011-12-16: qty 2

## 2011-12-16 MED ORDER — METRONIDAZOLE 500 MG PO TABS: 500.0000 mg | ORAL_TABLET | Freq: Two times a day (BID) | ORAL | Status: AC

## 2011-12-16 MED ORDER — LIDOCAINE HCL (PF) 1 % IJ SOLN
2.0000 mL | Freq: Once | INTRAMUSCULAR | Status: AC
  Administered 2011-12-16: 2 mL
  Filled 2011-12-16: qty 5
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
## 2011-12-16 NOTE — ED Notes (Signed)
Received pt. From triage pt. Alert and oriented, NAD noted

## 2011-12-16 NOTE — ED Notes (Signed)
Pt. Discharged to home, pt. Alert and oriented, NAD noted 

## 2012-02-29 ENCOUNTER — Encounter (HOSPITAL_COMMUNITY): Payer: Self-pay

## 2012-02-29 ENCOUNTER — Emergency Department (HOSPITAL_COMMUNITY)
Admission: EM | Admit: 2012-02-29 | Discharge: 2012-02-29 | Disposition: A | Discharge status: Home / Self Care | Payer: Non-veteran care | Attending: Emergency Medicine | Admitting: Emergency Medicine

## 2012-02-29 DIAGNOSIS — S0083XA Contusion of other part of head, initial encounter: Secondary | ICD-10-CM

## 2012-02-29 DIAGNOSIS — R51 Headache: Secondary | ICD-10-CM | POA: Insufficient documentation

## 2012-02-29 DIAGNOSIS — S1093XA Contusion of unspecified part of neck, initial encounter: Secondary | ICD-10-CM | POA: Insufficient documentation

## 2012-02-29 DIAGNOSIS — F17200 Nicotine dependence, unspecified, uncomplicated: Secondary | ICD-10-CM | POA: Insufficient documentation

## 2012-02-29 DIAGNOSIS — S0003XA Contusion of scalp, initial encounter: Secondary | ICD-10-CM | POA: Insufficient documentation

## 2012-02-29 MED ORDER — HYDROCODONE-ACETAMINOPHEN 5-325 MG PO TABS: ORAL_TABLET | ORAL | Status: DC

## 2012-02-29 MED ORDER — HYDROCODONE-ACETAMINOPHEN 5-325 MG PO TABS
1.0000 | ORAL_TABLET | Freq: Once | ORAL | Status: AC
  Administered 2012-02-29: 1 via ORAL
  Filled 2012-02-29: qty 1
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
## 2012-03-14 ENCOUNTER — Emergency Department (HOSPITAL_COMMUNITY)
Admission: EM | Admit: 2012-03-14 | Discharge: 2012-03-14 | Disposition: A | Discharge status: Home / Self Care | Payer: Non-veteran care | Attending: Emergency Medicine | Admitting: Emergency Medicine

## 2012-03-14 ENCOUNTER — Emergency Department (HOSPITAL_COMMUNITY): Payer: Non-veteran care

## 2012-03-14 ENCOUNTER — Encounter (HOSPITAL_COMMUNITY): Payer: Self-pay | Admitting: *Deleted

## 2012-03-14 DIAGNOSIS — S0083XA Contusion of other part of head, initial encounter: Secondary | ICD-10-CM | POA: Insufficient documentation

## 2012-03-14 DIAGNOSIS — S0003XA Contusion of scalp, initial encounter: Secondary | ICD-10-CM | POA: Insufficient documentation

## 2012-03-14 DIAGNOSIS — F341 Dysthymic disorder: Secondary | ICD-10-CM | POA: Insufficient documentation

## 2012-03-14 DIAGNOSIS — R110 Nausea: Secondary | ICD-10-CM | POA: Insufficient documentation

## 2012-03-14 HISTORY — DX: Major depressive disorder, single episode, unspecified: F32.9

## 2012-03-14 HISTORY — DX: Anxiety disorder, unspecified: F41.9

## 2012-03-14 HISTORY — DX: Depression, unspecified: F32.A

## 2012-03-14 HISTORY — DX: Post-traumatic stress disorder, unspecified: F43.10

## 2012-03-14 HISTORY — DX: Other chronic pain: G89.29

## 2012-03-14 HISTORY — DX: Pain in unspecified ankle and joints of unspecified foot: M25.579

## 2012-03-14 MED ORDER — IBUPROFEN 800 MG PO TABS: 800.0000 mg | ORAL_TABLET | Freq: Three times a day (TID) | ORAL | Status: AC | PRN

## 2012-03-14 MED ORDER — HYDROCODONE-ACETAMINOPHEN 5-325 MG PO TABS
1.0000 | ORAL_TABLET | Freq: Once | ORAL | Status: AC
  Administered 2012-03-14: 1 via ORAL
  Filled 2012-03-14: qty 1

## 2012-03-14 NOTE — Discharge Instructions (Signed)
Continue to use cold compresses on your face while you heal.  Take the prescribed antiinflammatories as needed for pain.  You may return to the ER at any time for worsening condition or any new symptoms that concern you.  Contusion A contusion is a deep bruise. Contusions happen when an injury causes bleeding under the skin. Signs of bruising include pain, puffiness (swelling), and discolored skin. The contusion may turn blue, purple, or yellow. HOME CARE   Put ice on the injured area.   Put ice in a plastic bag.   Place a towel between your skin and the bag.   Leave the ice on for 15 to 20 minutes, 3 to 4 times a day.   Only take medicine as told by your doctor.   Rest the injured area.   If possible, raise (elevate) the injured area to lessen puffiness.  GET HELP RIGHT AWAY IF:   You have more bruising or puffiness.   You have pain that is getting worse.   Your puffiness or pain is not helped by medicine.  MAKE SURE YOU:   Understand these instructions.   Will watch your condition.   Will get help right away if you are not doing well or get worse.  Document Released: 05/05/2008 Document Revised: 11/06/2011 Document Reviewed: 09/22/2011 [facility] Patient Information 2012 [place], [place].
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
## 2012-03-16 NOTE — ED Provider Notes (Signed)
Medical screening examination/treatment/procedure(s) were performed by non-physician practitioner and as supervising physician I was immediately available for consultation/collaboration.  Kalissa Grays, MD 03/16/12 0847 

## 2012-03-19 ENCOUNTER — Emergency Department (HOSPITAL_COMMUNITY)
Admission: EM | Admit: 2012-03-19 | Discharge: 2012-03-19 | Disposition: A | Discharge status: Home / Self Care | Payer: Non-veteran care | Attending: Emergency Medicine | Admitting: Emergency Medicine

## 2012-03-19 ENCOUNTER — Encounter (HOSPITAL_COMMUNITY): Payer: Self-pay | Admitting: Emergency Medicine

## 2012-03-19 DIAGNOSIS — H60399 Other infective otitis externa, unspecified ear: Secondary | ICD-10-CM | POA: Insufficient documentation

## 2012-03-19 DIAGNOSIS — H9210 Otorrhea, unspecified ear: Secondary | ICD-10-CM | POA: Insufficient documentation

## 2012-03-19 DIAGNOSIS — F341 Dysthymic disorder: Secondary | ICD-10-CM | POA: Insufficient documentation

## 2012-03-19 DIAGNOSIS — R51 Headache: Secondary | ICD-10-CM | POA: Insufficient documentation

## 2012-03-19 DIAGNOSIS — R221 Localized swelling, mass and lump, neck: Secondary | ICD-10-CM | POA: Insufficient documentation

## 2012-03-19 DIAGNOSIS — H6010 Cellulitis of external ear, unspecified ear: Secondary | ICD-10-CM

## 2012-03-19 DIAGNOSIS — F17200 Nicotine dependence, unspecified, uncomplicated: Secondary | ICD-10-CM | POA: Insufficient documentation

## 2012-03-19 DIAGNOSIS — R110 Nausea: Secondary | ICD-10-CM | POA: Insufficient documentation

## 2012-03-19 DIAGNOSIS — R220 Localized swelling, mass and lump, head: Secondary | ICD-10-CM | POA: Insufficient documentation

## 2012-03-19 DIAGNOSIS — H9209 Otalgia, unspecified ear: Secondary | ICD-10-CM | POA: Insufficient documentation

## 2012-03-19 MED ORDER — HYDROCODONE-ACETAMINOPHEN 5-325 MG PO TABS: 1.0000 | ORAL_TABLET | ORAL | Status: AC | PRN

## 2012-03-19 MED ORDER — CEPHALEXIN 500 MG PO CAPS: 500.0000 mg | ORAL_CAPSULE | Freq: Four times a day (QID) | ORAL | Status: AC

## 2012-03-19 MED ORDER — DIPHENHYDRAMINE HCL 50 MG/ML IJ SOLN
25.0000 mg | Freq: Once | INTRAMUSCULAR | Status: AC
  Administered 2012-03-19: 25 mg via INTRAVENOUS
  Filled 2012-03-19: qty 1

## 2012-03-19 MED ORDER — SODIUM CHLORIDE 0.9 % IV BOLUS (SEPSIS)
1000.0000 mL | Freq: Once | INTRAVENOUS | Status: AC
  Administered 2012-03-19: 1000 mL via INTRAVENOUS

## 2012-03-19 MED ORDER — METOCLOPRAMIDE HCL 5 MG/ML IJ SOLN
10.0000 mg | Freq: Once | INTRAMUSCULAR | Status: AC
  Administered 2012-03-19: 10 mg via INTRAVENOUS
  Filled 2012-03-19: qty 2

## 2012-03-19 MED ORDER — PROMETHAZINE HCL 25 MG PO TABS: 25.0000 mg | ORAL_TABLET | Freq: Four times a day (QID) | ORAL | Status: DC | PRN

## 2012-03-19 MED ORDER — DEXAMETHASONE SODIUM PHOSPHATE 10 MG/ML IJ SOLN
10.0000 mg | Freq: Once | INTRAMUSCULAR | Status: AC
  Administered 2012-03-19: 10 mg via INTRAVENOUS
  Filled 2012-03-19: qty 1

## 2012-03-19 MED ORDER — KETOROLAC TROMETHAMINE 30 MG/ML IJ SOLN
30.0000 mg | Freq: Once | INTRAMUSCULAR | Status: AC
  Administered 2012-03-19: 30 mg via INTRAVENOUS
  Filled 2012-03-19: qty 1

## 2012-03-19 MED ORDER — CEPHALEXIN 500 MG PO CAPS
500.0000 mg | ORAL_CAPSULE | Freq: Once | ORAL | Status: AC
  Administered 2012-03-19: 500 mg via ORAL
  Filled 2012-03-19: qty 1

## 2012-03-19 NOTE — ED Notes (Signed)
Pt presented to the ER with c/o right ear pain,statesnoted bloody and puss drainage from the same, pt also reports HA- migraine, 9/10 at this time. Pt reports that sx started about 2-3 days and noted drainage today.
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
## 2012-03-21 NOTE — ED Provider Notes (Signed)
Medical screening examination/treatment/procedure(s) were performed by non-physician practitioner and as supervising physician I was immediately available for consultation/collaboration.  Dionis Autry M Adeola Dennen, MD 03/21/12 2136 

## 2012-04-16 ENCOUNTER — Encounter (HOSPITAL_COMMUNITY): Payer: Self-pay | Admitting: Emergency Medicine

## 2012-04-16 ENCOUNTER — Emergency Department (HOSPITAL_COMMUNITY)
Admission: EM | Admit: 2012-04-16 | Discharge: 2012-04-16 | Disposition: A | Discharge status: Home / Self Care | Payer: Non-veteran care | Attending: Emergency Medicine | Admitting: Emergency Medicine

## 2012-04-16 DIAGNOSIS — H00019 Hordeolum externum unspecified eye, unspecified eyelid: Secondary | ICD-10-CM | POA: Insufficient documentation

## 2012-04-16 DIAGNOSIS — R112 Nausea with vomiting, unspecified: Secondary | ICD-10-CM | POA: Insufficient documentation

## 2012-04-16 DIAGNOSIS — H5710 Ocular pain, unspecified eye: Secondary | ICD-10-CM | POA: Insufficient documentation

## 2012-04-16 DIAGNOSIS — R51 Headache: Secondary | ICD-10-CM | POA: Insufficient documentation

## 2012-04-16 DIAGNOSIS — F17200 Nicotine dependence, unspecified, uncomplicated: Secondary | ICD-10-CM | POA: Insufficient documentation

## 2012-04-16 DIAGNOSIS — F341 Dysthymic disorder: Secondary | ICD-10-CM | POA: Insufficient documentation

## 2012-04-16 DIAGNOSIS — H5789 Other specified disorders of eye and adnexa: Secondary | ICD-10-CM | POA: Insufficient documentation

## 2012-04-16 DIAGNOSIS — Z79899 Other long term (current) drug therapy: Secondary | ICD-10-CM | POA: Insufficient documentation

## 2012-04-16 DIAGNOSIS — R6883 Chills (without fever): Secondary | ICD-10-CM | POA: Insufficient documentation

## 2012-04-16 DIAGNOSIS — H00016 Hordeolum externum left eye, unspecified eyelid: Secondary | ICD-10-CM

## 2012-04-16 MED ORDER — SULFAMETHOXAZOLE-TRIMETHOPRIM 800-160 MG PO TABS: 1.0000 | ORAL_TABLET | Freq: Two times a day (BID) | ORAL | Status: AC

## 2012-04-16 MED ORDER — HYDROCODONE-ACETAMINOPHEN 5-325 MG PO TABS: 1.0000 | ORAL_TABLET | ORAL | Status: AC | PRN

## 2012-04-16 MED ORDER — HYDROCODONE-ACETAMINOPHEN 5-325 MG PO TABS
1.0000 | ORAL_TABLET | Freq: Once | ORAL | Status: AC
  Administered 2012-04-16: 1 via ORAL
  Filled 2012-04-16: qty 1

## 2012-04-16 NOTE — ED Provider Notes (Signed)
History  This chart was scribed for [name], MD by Bennett Scrape. This patient was seen in room STRE4/STRE4 and the patient's care was started at 6:08PM.  CSN: [id number]  Arrival date & time 04/16/12  1624   First MD Initiated Contact with Patient 04/16/12 1808      Chief Complaint  Patient presents with  . Eye Injury    The history is provided by the patient. No language interpreter was used.    [name] is a 26 y.o. female who presents to the Emergency Department complaining of one week of a sudden onset, gradually worsening, constant possible spider bite under her left eye with associated emesis, chills, nausea and HA. She states that 2 days ago the area under the left lower lid became red and swollen. She states that she has had a small amount of pus drain since the onset of the swelling. She denies blurred vision. She reports that she has been using warm compresses with mild improvement. She has not taken any OTC medications to improve the pain. She denies dysuria, abdominal pain, chest pain and diarrhea as associated symptoms. She has a h/o PTSD, depression and chronic ankle pain. She is a current everyday smoker and occasional alcohol user.   Past Medical History  Diagnosis Date  . PTSD (post-traumatic stress disorder)   . Depression   . Anxiety   . Ankle pain, chronic     Past Surgical History  Procedure Date  . Appendectomy   . Tubal ligation     2009  . Ankle surgery     History reviewed. No pertinent family history.  History  Substance Use Topics  . Smoking status: Current Everyday Smoker -- 0.5 packs/day  . Smokeless tobacco: Not on file  . Alcohol Use: Yes     occasionally     Review of Systems  A complete 10 system review of systems was obtained and all systems are negative except as noted in the HPI and PMH.   Allergies  Penicillins cross reactors  Home Medications   Current Outpatient Rx  Name Route Sig Dispense Refill  .  ACETAMINOPHEN 500 MG PO TABS Oral Take 1,000 mg by mouth every 6 (six) hours as needed. pain    . PRENATAL 27-0.8 MG PO TABS Oral Take 1 tablet by mouth daily.    Marland Kitchen HYDROCODONE-ACETAMINOPHEN 5-325 MG PO TABS Oral Take 1 tablet by mouth every 4 (four) hours as needed for pain. 15 tablet 0  . PROMETHAZINE HCL 25 MG PO TABS Oral Take 1 tablet (25 mg total) by mouth every 6 (six) hours as needed for nausea. 10 tablet 0  . SULFAMETHOXAZOLE-TRIMETHOPRIM 800-160 MG PO TABS Oral Take 1 tablet by mouth 2 (two) times daily. One po bid x 7 days 14 tablet 0    Triage Vitals: BP 140/78  Pulse 91  Temp(Src) 98.3 F (36.8 C) (Oral)  Resp 16  SpO2 100%  LMP 03/14/2012  Physical Exam  Nursing note and vitals reviewed. Constitutional: She is oriented to person, place, and time. She appears well-developed and well-nourished.  HENT:  Head: Normocephalic and atraumatic.  Eyes: Conjunctivae and EOM are normal. Pupils are equal, round, and reactive to light.       Tenderness, redness and swelling off the lower left lid, the is pointing on the lid   Neck: Normal range of motion and phonation normal. Neck supple.  Cardiovascular: Normal rate, regular rhythm and intact distal pulses.   Pulmonary/Chest: Effort  normal and breath sounds normal. She exhibits no tenderness.  Abdominal: Soft. She exhibits no distension. There is no tenderness. There is no guarding.  Musculoskeletal: Normal range of motion.  Neurological: She is alert and oriented to person, place, and time. She has normal strength. She exhibits normal muscle tone.  Skin: Skin is warm and dry.  Psychiatric: She has a normal mood and affect. Her behavior is normal. Judgment and thought content normal.    ED Course  Procedures (including critical care time)  DIAGNOSTIC STUDIES: Oxygen Saturation is 100% on room air, normal by my interpretation.    COORDINATION OF CARE: 6:27PM-Discussed antibiotic and pain medications with pt and pt agreed. Pt  requested pain medications before discharge. I will oblige because she states that she is not driving herself home. Advised pt to use warm compresses every hour to help the area drain.   Labs Reviewed - No data to display No results found.   1. Stye, left       MDM  Lower Eye-Lid infection, likely, stye. No evidence for conjunctivitis, facial abscess or cellulitis. She is stable for discharge      I personally performed the services described in this documentation, which was scribed in my presence. The recorded information has been reviewed and considered.    Plan: Home Medications- Norco, Septra; Home Treatments- Heat Applications; Recommended follow up- PCP prn   [name], MD 04/16/12 2037

## 2012-04-16 NOTE — ED Notes (Addendum)
Reports thinks got bite under L eye- started a week ago, fevers, chills, n/v, migraines since; pt has swelling to L eye, with some pus from swelling; denies blurred vision

## 2012-04-16 NOTE — Discharge Instructions (Signed)
  Use a warm, moist compress on the sore area 3 or 4 times a day. See the Dr. of your choice if not better in 3 or 4 days.       Sty A sty (hordeolum) is an infection of a gland in the eyelid located at the base of the eyelash. A sty may develop a white or yellow head of pus. It can be puffy (swollen). Usually, the sty will burst and pus will come out on its own. They do not leave lumps in the eyelid once they drain. A sty is often confused with another form of cyst of the eyelid called a chalazion. Chalazions occur within the eyelid and not on the edge where the bases of the eyelashes are. They often are red, sore and then form firm lumps in the eyelid. CAUSES   Germs (bacteria).   Lasting (chronic) eyelid inflammation.  SYMPTOMS   Tenderness, redness and swelling along the edge of the eyelid at the base of the eyelashes.   Sometimes, there is a white or yellow head of pus. It may or may not drain.  DIAGNOSIS  An ophthalmologist will be able to distinguish between a sty and a chalazion and treat the condition appropriately.  TREATMENT   Styes are typically treated with warm packs (compresses) until drainage occurs.   In rare cases, medicines that kill germs (antibiotics) may be prescribed. These antibiotics may be in the form of drops, cream or pills.   If a hard lump has formed, it is generally necessary to do a small incision and remove the hardened contents of the cyst in a minor surgical procedure done in the office.   In suspicious cases, your caregiver may send the contents of the cyst to the lab to be certain that it is not a rare, but dangerous form of cancer of the glands of the eyelid.  HOME CARE INSTRUCTIONS   Wash your hands often and dry them with a clean towel. Avoid touching your eyelid. This may spread the infection to other parts of the eye.   Apply heat to your eyelid for 10 to 20 minutes, several times a day, to ease pain and help to heal it faster.   Do not  squeeze the sty. Allow it to drain on its own. Wash your eyelid carefully 3 to 4 times per day to remove any pus.  SEEK IMMEDIATE MEDICAL CARE IF:   Your eye becomes painful or puffy (swollen).   Your vision changes.   Your sty does not drain by itself within 3 days.   Your sty comes back within a short period of time, even with treatment.   You have redness (inflammation) around the eye.   You have a fever.  Document Released: 08/27/2005 Document Revised: 11/06/2011 Document Reviewed: 05/01/2009 [facility] Patient Information 2012 [place], [place].

## 2012-04-27 ENCOUNTER — Encounter: Payer: Non-veteran care | Admitting: *Deleted

## 2012-04-27 ENCOUNTER — Encounter: Payer: Self-pay | Admitting: *Deleted

## 2012-04-27 NOTE — Progress Notes (Signed)
Patient left AMA without been seen. This encounter was created in error - please disregard. Patient left before being seen and against medical advice.

## 2012-04-30 ENCOUNTER — Encounter: Payer: Self-pay | Admitting: *Deleted

## 2012-06-20 ENCOUNTER — Emergency Department (HOSPITAL_COMMUNITY)
Admission: EM | Admit: 2012-06-20 | Discharge: 2012-06-22 | Disposition: A | Discharge status: Other Acute Inpatient Hospital | Payer: Non-veteran care | Attending: Emergency Medicine | Admitting: Emergency Medicine

## 2012-06-20 DIAGNOSIS — F1110 Opioid abuse, uncomplicated: Secondary | ICD-10-CM | POA: Insufficient documentation

## 2012-06-21 DIAGNOSIS — F1994 Other psychoactive substance use, unspecified with psychoactive substance-induced mood disorder: Secondary | ICD-10-CM

## 2012-06-21 DIAGNOSIS — F1120 Opioid dependence, uncomplicated: Secondary | ICD-10-CM

## 2012-06-21 DIAGNOSIS — F1420 Cocaine dependence, uncomplicated: Secondary | ICD-10-CM

## 2012-06-21 LAB — DIFFERENTIAL
Basophils Relative: 1 % (ref 0–1)
Lymphs Abs: 2.7 10*3/uL (ref 0.7–4.0)
Monocytes Absolute: 0.7 10*3/uL (ref 0.1–1.0)
Monocytes Relative: 9 % (ref 3–12)
Neutro Abs: 3.9 10*3/uL (ref 1.7–7.7)
Neutrophils Relative %: 52 % (ref 43–77)

## 2012-06-21 LAB — RAPID URINE DRUG SCREEN, HOSP PERFORMED
Amphetamines: NOT DETECTED
Barbiturates: NOT DETECTED
Benzodiazepines: NOT DETECTED
Cocaine: POSITIVE — AB
Tetrahydrocannabinol: NOT DETECTED

## 2012-06-21 LAB — CBC
Hemoglobin: 11.8 g/dL — ABNORMAL LOW (ref 12.0–15.0)
MCHC: 34 g/dL (ref 30.0–36.0)
RBC: 4.02 MIL/uL (ref 3.87–5.11)
WBC: 7.5 10*3/uL (ref 4.0–10.5)

## 2012-06-21 LAB — BASIC METABOLIC PANEL
CO2: 23 mEq/L (ref 19–32)
Calcium: 9.2 mg/dL (ref 8.4–10.5)
GFR calc non Af Amer: >90 mL/min (ref >90)
Glucose, Bld: 109 mg/dL — ABNORMAL HIGH (ref 70–99)
Potassium: 4 mEq/L (ref 3.5–5.1)
Sodium: 137 mEq/L (ref 135–145)

## 2012-06-21 LAB — ETHANOL: Alcohol, Ethyl (B): <11 mg/dL (ref 0–11)

## 2012-06-21 LAB — PREGNANCY, URINE: Preg Test, Ur: NEGATIVE

## 2012-06-21 MED ORDER — IBUPROFEN 600 MG PO TABS
600.0000 mg | ORAL_TABLET | Freq: Three times a day (TID) | ORAL | Status: DC | PRN
  Administered 2012-06-21: 600 mg via ORAL
  Filled 2012-06-21: qty 1

## 2012-06-21 MED ORDER — ACETAMINOPHEN 325 MG PO TABS: 650.0000 mg | ORAL_TABLET | ORAL | Status: DC | PRN

## 2012-06-21 MED ORDER — NICOTINE 21 MG/24HR TD PT24
21.0000 mg | MEDICATED_PATCH | Freq: Every day | TRANSDERMAL | Status: DC
  Administered 2012-06-21: 21 mg via TRANSDERMAL
  Filled 2012-06-21: qty 1

## 2012-06-21 MED ORDER — ONDANSETRON HCL 4 MG PO TABS
4.0000 mg | ORAL_TABLET | Freq: Three times a day (TID) | ORAL | Status: DC | PRN
  Administered 2012-06-21: 4 mg via ORAL
  Filled 2012-06-21: qty 1

## 2012-06-21 MED ORDER — LORAZEPAM 1 MG PO TABS
1.0000 mg | ORAL_TABLET | Freq: Three times a day (TID) | ORAL | Status: DC | PRN
  Administered 2012-06-21: 1 mg via ORAL
  Filled 2012-06-21: qty 1

## 2012-06-21 NOTE — Consult Note (Signed)
Reason for Consult: Patient requesting detox treatment for heroin and cocaine Referring Physician: Dr. Nelida Meuse is an 26 y.o. female.  HPI: Patient was seen and chart reviewed. This is a first emergency department visit for requesting detox treatment for heroin and cocaine. Patient was brought in by her cousin and requesting treatment voluntarily Patient reports she started using ecstasy, which later drawn into cocaine and heroin. The heroin abuse started and regular basis since age 61. She uses 2 bags of heroin per day; last use was 06/20/2012. She injects heroin in both arms. Patient also using cocaine 1-2x's per week since age 52. She last used 06/20/12. She reports several withdrawal symptoms including: nausea, hot/cold flashes, sweating, and body cramps but none loose motions. She has received outpatient services from the [employer] for posttraumatic stress disorder before 2011. Reportedly she was discharged general from [employer]. Patient reports previously serving in the [employer] from 2005 - 2011; she has been out of their system since 2011. She went to the [facility] in [place] last week for a intake appointment and hoping to obtain primary care physician soon. She states frustrated about receiving services and benefits from the [place] system. Patient claims being emotionally and physically stressed since discharged from [employer]. She has been depressed stating that her marriage has ended and her kids were taken away. Patient wants to be clean and get her kids back. She also reported her grandmother who raised her was passed away years ago. She has no contact with her biological parents.   Patient has been tearful, dysphoric, but denies being suicidal or homicidal ideations, intentions and plans She has no previous history of self harm. Patient has no auditory or visual hallucinations, delusions, or paranoia.   Past Medical History  Diagnosis Date  . PTSD (post-traumatic stress disorder)   .  Depression   . Anxiety   . Ankle pain, chronic     Past Surgical History  Procedure Date  . Appendectomy   . Tubal ligation     2009  . Ankle surgery     No family history on file.  Social History:  reports that she has been smoking.  She does not have any smokeless tobacco history on file. She reports that she drinks alcohol. She reports that she does not use illicit drugs.  Allergies:  Allergies  Allergen Reactions  . Penicillins Cross Reactors Hives    Medications: I have reviewed the patient's current medications.  No results found for this or any previous visit (from the past 48 hour(s)).  No results found.  No psychosis and Positive for anxiety, bad mood, depression, illegal drug usage, tobacco use and cannabis abuse. Blood pressure 115/71, pulse 75, temperature 98 F (36.7 C), temperature source Oral, resp. rate 18, SpO2 99.00%.   Assessment/Plan: Opiate dependence and opiate withdrawal. Cocaine dependence. Substance-induced mood disorder.  Recommendation: Patient needed acute psychiatric hospitalization for detox treatment and the possibility of rehabilitation services after discharge.  Larwence Tu,JANARDHAHA R. 06/21/2012, 5:08 PM

## 2012-06-21 NOTE — ED Notes (Signed)
Report received-airway intact-no s/s's of distress-resting quietly, will continue to monitor 

## 2012-06-21 NOTE — BH Assessment (Signed)
Assessment Note   [name] is an 26 y.o. female. Pt requesting detox from heroin, also states suicidal thoughts, no plan. Patient reports heroin use daily since age 65. She uses 2 bags of heroin per day; last use was 06/20/2012. She injects heroin in both arms. Patient also using cocaine 1-2x's per week since age 45. She last used 06/20/12.  She reports several withdrawal symptoms including: nausea, hot/cold flashes, sweating, and body cramps. She has received outpatient services from the [employer]. Patient reports previously serving in [employer]; she has been out of their system since 2011. She went to the [facility] in [place] last week for a intake appointment. Sts that she has felt depressed stating that her marriage has ended and her kids were taken away. Patient would not provide further details about this matter. She also has suicidal ideations; no specific plan or intent. During the assessment she no longer has suicidal thoughts and feels that she is able to contract for safety. She has no previous history of self harm.  Patient asked if she has homicidal ideations and she replies, "Well not now". Patient admits to having homicidal ideations upon arrival, however; will not disclose details about this subject.   Pt referred to [facility] for a inpatient admission. Patient stating that she is no longer suicidal. However, continues to request detox and substance abuse treatment.    Axis I: Depressive Disorder NOS; Opioid Dependence; Cocaine Dependence Axis II: Diagnosis Deferred Axis III:  Past Medical History  Diagnosis Date  . PTSD (post-traumatic stress disorder)   . Depression   . Anxiety   . Ankle pain, chronic   Axis IV: limited primary support and psychosocial stressors Axis V: 38   Past Medical History:  Past Medical History  Diagnosis Date  . PTSD (post-traumatic stress disorder)   . Depression   . Anxiety   . Ankle pain, chronic     Past Surgical History  Procedure  Date  . Appendectomy   . Tubal ligation     2009  . Ankle surgery     Family History: No family history on file.  Social History:  reports that she has been smoking.  She does not have any smokeless tobacco history on file. She reports that she drinks alcohol. She reports that she does not use illicit drugs.  Additional Social History:  Alcohol / Drug Use Pain Medications: SEE MAR Prescriptions: SEE MAR Over the Counter: SEE MAR History of alcohol / drug use?: Yes Substance #1 Name of Substance 1: Heroin 1 - Age of First Use: 26 yrs old 1 - Amount (size/oz): 2 bags per day 1 - Frequency: daily 1 - Duration: daily since age 28 1 - Last Use / Amount: 06/20/2012; pt reports using 2 bags of heroin Substance #2 Name of Substance 2: Cocaine 2 - Age of First Use: 22 2 - Amount (size/oz): "20-40" 2 - Frequency: 1-2x's per week 2 - Duration: on-going 1-2x's per week 2 - Last Use / Amount: 06/20/2012; $40 worth  CIWA: CIWA-Ar BP: 115/71 mmHg Pulse Rate: 75  COWS:    Allergies:  Allergies  Allergen Reactions  . Penicillins Cross Reactors Hives    Home Medications:  (Not in a hospital admission)  OB/GYN Status:  No LMP recorded.  General Assessment Data Location of Assessment: [facility] ED Living Arrangements: Other (Comment) (pt lives with her granfather) Can pt return to current living arrangement?: Yes Admission Status: Voluntary Is patient capable of signing voluntary  admission?: Yes Transfer from: Acute Hospital Referral Source: Self/Family/Friend     Risk to self Suicidal Ideation: Yes-Currently Present Suicidal Intent: No Is patient at risk for suicide?: No Suicidal Plan?: No Access to Means: No What has been your use of drugs/alcohol within the last 12 months?:  (Heroin and Crack Cocaine) Previous Attempts/Gestures: No How many times?:  (0) Other Self Harm Risks:  (n/a) Triggers for Past Attempts:  (no previous triggers or past attempts) Intentional Self  Injurious Behavior: None Family Suicide History: No Recent stressful life event(s): Other (Comment) (seperation/divorce from spouse; children not in custody) Persecutory voices/beliefs?: No Depression: Yes Depression Symptoms: Feeling worthless/self pity;Loss of interest in usual pleasures;Isolating;Feeling angry/irritable Substance abuse history and/or treatment for substance abuse?: Yes (pt reports receiving substance abuse treatment in the [employer]) Suicide prevention information given to non-admitted patients: Not applicable  Risk to Others Homicidal Ideation: No Thoughts of Harm to Others: No Current Homicidal Intent: No Current Homicidal Plan: No Access to Homicidal Means: No Identified Victim:  (n/a) History of harm to others?: No Assessment of Violence: None Noted Violent Behavior Description:  (patient has remained calm and cooperative) Does patient have access to weapons?: No Criminal Charges Pending?: No Does patient have a court date: No  Psychosis Hallucinations: None noted Delusions: None noted  Mental Status Report Appear/Hygiene: Disheveled Eye Contact: Fair Motor Activity: Freedom of movement Speech: Logical/coherent Level of Consciousness: Alert Mood: Depressed Affect: Appropriate to circumstance Anxiety Level: None Thought Processes: Coherent Judgement: Unimpaired Orientation: Person;Place;Situation;Time Obsessive Compulsive Thoughts/Behaviors: None  Cognitive Functioning Concentration: Decreased Memory: Recent Intact;Remote Intact IQ: Average Insight: Fair Impulse Control: Fair Appetite: Fair Weight Loss:  (no wt. loss reported) Weight Gain:  (no wt. gain reported) Sleep: No Change Total Hours of Sleep:  (varies; pt would not give a specific number) Vegetative Symptoms: None  ADLScreening [facility] Services) Patient's cognitive ability adequate to safely complete daily activities?: Yes Patient able to express need for assistance with  ADLs?: Yes Independently performs ADLs?: Yes  Abuse/Neglect [facility]) Physical Abuse: Denies Verbal Abuse: Denies Sexual Abuse: Denies  Prior Inpatient Therapy Prior Inpatient Therapy: No Prior Therapy Dates:  (n/a) Prior Therapy Facilty/Provider(s):  (n/a) Reason for Treatment:  (n/a)  Prior Outpatient Therapy Prior Outpatient Therapy: Yes Prior Therapy Dates:  (visited the [facility] in [place] for a intake appt only)  ADL Screening (condition at time of admission) Patient's cognitive ability adequate to safely complete daily activities?: Yes Patient able to express need for assistance with ADLs?: Yes Independently performs ADLs?: Yes Weakness of Legs: None Weakness of Arms/Hands: None  Home Assistive Devices/Equipment Home Assistive Devices/Equipment: None    Abuse/Neglect Assessment (Assessment to be complete while patient is alone) Physical Abuse: Denies Verbal Abuse: Denies Sexual Abuse: Denies Exploitation of patient/patient's resources: Denies Self-Neglect: Denies Values / Beliefs Cultural Requests During Hospitalization: None Spiritual Requests During Hospitalization: None   Advance Directives (For Healthcare) Advance Directive: Patient does not have advance directive Nutrition Screen Diet: Regular Unintentional weight loss greater than 10lbs within the last month: No Problems chewing or swallowing foods and/or liquids: No Home Tube Feeding or Total Parenteral Nutrition (TPN): No Patient appears severely malnourished: No Pregnant or Lactating: No  Additional Information 1:1 In Past 12 Months?: No CIRT Risk: No Elopement Risk: No Does patient have medical clearance?: Yes     Disposition:  Disposition Disposition of Patient: Inpatient treatment program;Referred to [facility] (please run for a inpatient admission)) Type of inpatient treatment program: Adult Patient referred to: Other (Comment) (  [facility])  On Site Evaluation by:   Reviewed with Physician:      Melynda Ripple [facility] 06/21/2012 4:02 PM

## 2012-06-21 NOTE — ED Provider Notes (Signed)
Pt alert. Content. Nad. Awaiting act eval/placement.  Suzi Roots, MD 06/21/12 [phone]

## 2012-06-21 NOTE — ED Provider Notes (Signed)
History     CSN: [id number]  Arrival date & time 06/20/12  1912   First MD Initiated Contact with Patient 06/21/12 [phone]      No chief complaint on file.   (Consider location/radiation/quality/duration/timing/severity/associated sxs/prior treatment) HPI Hx per PT. Requesting detox from heroin, also states suicidal thoughts, no plan. No self injury. Injects in both arms, no F/C. No n/v/d. No withdrawal symptoms at this time. Mod in severity. Past Medical History  Diagnosis Date  . PTSD (post-traumatic stress disorder)   . Depression   . Anxiety   . Ankle pain, chronic     Past Surgical History  Procedure Date  . Appendectomy   . Tubal ligation     2009  . Ankle surgery     No family history on file.  History  Substance Use Topics  . Smoking status: Current Everyday Smoker -- 0.5 packs/day  . Smokeless tobacco: Not on file  . Alcohol Use: Yes     occasionally    OB History    Grav Para Term Preterm Abortions TAB SAB Ect Mult Living                  Review of Systems  Constitutional: Negative for fever and chills.  HENT: Negative for neck pain and neck stiffness.   Eyes: Negative for pain.  Respiratory: Negative for shortness of breath.   Cardiovascular: Negative for chest pain.  Gastrointestinal: Negative for abdominal pain.  Genitourinary: Negative for dysuria.  Musculoskeletal: Negative for back pain.  Skin: Negative for rash.  Neurological: Negative for headaches.  All other systems reviewed and are negative.    Allergies  Penicillins cross reactors  Home Medications   Current Outpatient Rx  Name Route Sig Dispense Refill  . ACETAMINOPHEN 500 MG PO TABS Oral Take 1,000 mg by mouth every 6 (six) hours as needed. pain    . PRENATAL 27-0.8 MG PO TABS Oral Take 1 tablet by mouth daily.    Marland Kitchen PROMETHAZINE HCL 25 MG PO TABS Oral Take 1 tablet (25 mg total) by mouth every 6 (six) hours as needed for nausea. 10 tablet 0    There were no vitals taken  for this visit.  Physical Exam  Constitutional: She is oriented to person, place, and time. She appears well-developed and well-nourished.  HENT:  Head: Normocephalic and atraumatic.  Eyes: Conjunctivae and EOM are normal. Pupils are equal, round, and reactive to light.  Neck: Trachea normal. Neck supple. No thyromegaly present.  Cardiovascular: Normal rate, regular rhythm, S1 normal, S2 normal and normal pulses.     No systolic murmur is present   No diastolic murmur is present  Pulses:      Radial pulses are 2+ on the right side, and 2+ on the left side.  Pulmonary/Chest: Effort normal and breath sounds normal. She has no wheezes. She has no rhonchi. She has no rales. She exhibits no tenderness.  Abdominal: Soft. Normal appearance and bowel sounds are normal. There is no tenderness. There is no CVA tenderness and negative Murphy's sign.  Musculoskeletal:       BLE:s Calves nontender, no cords or erythema, negative Homans sign  Neurological: She is alert and oriented to person, place, and time. She has normal strength. No cranial nerve deficit or sensory deficit. GCS eye subscore is 4. GCS verbal subscore is 5. GCS motor subscore is 6.  Skin: Skin is warm and dry. No rash noted. She is not diaphoretic.  Psychiatric: Her speech is  normal.       Cooperative and appropriate    ED Course  Procedures (including critical care time)  Psych holding orders and psych labs ordered.   ACT consult and eval.  No active SI or indication for IVC at this time.   Plan: ACT to evaluate and if no detox available, OK for discharge home with outpatient referrals  MDM   Nursing notes and VS reviewed.        Sunnie Nielsen, MD 06/21/12 [phone]

## 2012-06-21 NOTE — ED Notes (Signed)
Gave patient a stick of cheese and a cup of sprite

## 2012-06-21 NOTE — ED Notes (Signed)
Patient was upset with what MD had told her-stated he told her that we were unable to help her and she would end up using again-patient tearful, stated it was a big step for to seek help, wants to get clean and sober-encouraged patient to continue to seek help-offered reassurance-will continue to monitor

## 2012-06-22 ENCOUNTER — Inpatient Hospital Stay (HOSPITAL_COMMUNITY)
Admission: EM | Admit: 2012-06-22 | Discharge: 2012-06-24 | DRG: 897 | Disposition: A | Discharge status: Home / Self Care | Payer: Federal, State, Local not specified - Other | Source: Other Acute Inpatient Hospital | Attending: Psychiatry | Admitting: Psychiatry

## 2012-06-22 ENCOUNTER — Encounter (HOSPITAL_COMMUNITY): Payer: Self-pay | Admitting: *Deleted

## 2012-06-22 DIAGNOSIS — F19939 Other psychoactive substance use, unspecified with withdrawal, unspecified: Principal | ICD-10-CM

## 2012-06-22 DIAGNOSIS — F329 Major depressive disorder, single episode, unspecified: Secondary | ICD-10-CM | POA: Diagnosis present

## 2012-06-22 DIAGNOSIS — M25579 Pain in unspecified ankle and joints of unspecified foot: Secondary | ICD-10-CM | POA: Diagnosis present

## 2012-06-22 DIAGNOSIS — G8929 Other chronic pain: Secondary | ICD-10-CM | POA: Diagnosis present

## 2012-06-22 DIAGNOSIS — F1920 Other psychoactive substance dependence, uncomplicated: Secondary | ICD-10-CM

## 2012-06-22 DIAGNOSIS — F3289 Other specified depressive episodes: Secondary | ICD-10-CM

## 2012-06-22 DIAGNOSIS — F1120 Opioid dependence, uncomplicated: Secondary | ICD-10-CM

## 2012-06-22 DIAGNOSIS — F1994 Other psychoactive substance use, unspecified with psychoactive substance-induced mood disorder: Secondary | ICD-10-CM

## 2012-06-22 DIAGNOSIS — F4310 Post-traumatic stress disorder, unspecified: Secondary | ICD-10-CM

## 2012-06-22 DIAGNOSIS — F17200 Nicotine dependence, unspecified, uncomplicated: Secondary | ICD-10-CM

## 2012-06-22 MED ORDER — DICYCLOMINE HCL 20 MG PO TABS: 20.0000 mg | ORAL_TABLET | Freq: Four times a day (QID) | ORAL | Status: DC | PRN

## 2012-06-22 MED ORDER — PRAZOSIN HCL 1 MG PO CAPS
1.0000 mg | ORAL_CAPSULE | Freq: Every evening | ORAL | Status: DC | PRN
  Administered 2012-06-22 – 2012-06-23 (×3): 1 mg via ORAL
  Filled 2012-06-22 (×6): qty 1

## 2012-06-22 MED ORDER — LOPERAMIDE HCL 2 MG PO CAPS
2.0000 mg | ORAL_CAPSULE | ORAL | Status: DC | PRN
  Administered 2012-06-22 – 2012-06-23 (×2): 4 mg via ORAL

## 2012-06-22 MED ORDER — TRAZODONE HCL 100 MG PO TABS
100.0000 mg | ORAL_TABLET | Freq: Every evening | ORAL | Status: DC | PRN
  Filled 2012-06-22: qty 28

## 2012-06-22 MED ORDER — NAPROXEN 500 MG PO TABS
500.0000 mg | ORAL_TABLET | Freq: Two times a day (BID) | ORAL | Status: DC | PRN
  Administered 2012-06-22: 500 mg via ORAL
  Filled 2012-06-22: qty 1

## 2012-06-22 MED ORDER — CLONIDINE HCL 0.1 MG PO TABS
0.1000 mg | ORAL_TABLET | Freq: Four times a day (QID) | ORAL | Status: DC
  Administered 2012-06-22 (×2): 0.1 mg via ORAL
  Filled 2012-06-22 (×9): qty 1

## 2012-06-22 MED ORDER — GRX ANALGESIC BALM EX OINT
1.0000 "application " | TOPICAL_OINTMENT | Freq: Four times a day (QID) | CUTANEOUS | Status: DC | PRN
  Filled 2012-06-22: qty 28

## 2012-06-22 MED ORDER — NICOTINE 21 MG/24HR TD PT24
21.0000 mg | MEDICATED_PATCH | Freq: Every day | TRANSDERMAL | Status: DC
  Administered 2012-06-22 – 2012-06-23 (×2): 21 mg via TRANSDERMAL
  Filled 2012-06-22 (×3): qty 1

## 2012-06-22 MED ORDER — HYDROXYZINE HCL 25 MG PO TABS
25.0000 mg | ORAL_TABLET | Freq: Four times a day (QID) | ORAL | Status: DC | PRN
  Administered 2012-06-22 – 2012-06-23 (×2): 25 mg via ORAL

## 2012-06-22 MED ORDER — ACETAMINOPHEN 325 MG PO TABS: 650.0000 mg | ORAL_TABLET | Freq: Four times a day (QID) | ORAL | Status: DC | PRN

## 2012-06-22 MED ORDER — ONDANSETRON 4 MG PO TBDP
4.0000 mg | ORAL_TABLET | Freq: Four times a day (QID) | ORAL | Status: DC | PRN
  Administered 2012-06-22: 4 mg via ORAL

## 2012-06-22 MED ORDER — CLONIDINE HCL 0.1 MG PO TABS: 0.1000 mg | ORAL_TABLET | ORAL | Status: DC

## 2012-06-22 MED ORDER — CLONIDINE HCL 0.2 MG/24HR TD PTWK
0.2000 mg | MEDICATED_PATCH | TRANSDERMAL | Status: DC
Start: 2012-06-22 — End: 2012-06-24
  Administered 2012-06-22: 0.2 mg via TRANSDERMAL
  Filled 2012-06-22: qty 1

## 2012-06-22 MED ORDER — PANTOPRAZOLE SODIUM 20 MG PO TBEC
20.0000 mg | DELAYED_RELEASE_TABLET | Freq: Two times a day (BID) | ORAL | Status: DC
  Administered 2012-06-22 – 2012-06-24 (×4): 20 mg via ORAL
  Filled 2012-06-22 (×6): qty 1

## 2012-06-22 MED ORDER — MAGNESIUM HYDROXIDE 400 MG/5ML PO SUSP: 30.0000 mL | Freq: Every day | ORAL | Status: DC | PRN

## 2012-06-22 MED ORDER — AMITRIPTYLINE HCL 25 MG PO TABS
25.0000 mg | ORAL_TABLET | Freq: Every day | ORAL | Status: DC
  Administered 2012-06-22 – 2012-06-23 (×2): 25 mg via ORAL
  Filled 2012-06-22 (×3): qty 1

## 2012-06-22 MED ORDER — MELOXICAM 7.5 MG PO TABS
7.5000 mg | ORAL_TABLET | Freq: Every day | ORAL | Status: DC
  Administered 2012-06-22 – 2012-06-23 (×2): 7.5 mg via ORAL
  Filled 2012-06-22: qty 14
  Filled 2012-06-22 (×4): qty 1

## 2012-06-22 MED ORDER — ALUM & MAG HYDROXIDE-SIMETH 200-200-20 MG/5ML PO SUSP: 30.0000 mL | ORAL | Status: DC | PRN

## 2012-06-22 MED ORDER — METHOCARBAMOL 500 MG PO TABS: 500.0000 mg | ORAL_TABLET | Freq: Three times a day (TID) | ORAL | Status: DC | PRN

## 2012-06-22 MED ORDER — CLONIDINE HCL 0.1 MG PO TABS: 0.1000 mg | ORAL_TABLET | Freq: Every day | ORAL | Status: DC

## 2012-06-22 NOTE — Progress Notes (Signed)
Pt did not attend d/c planning group on this date.  SW found pt resting all day and hard to wake to talk to.  SW met with pt when dr was seeing pt.  Pt states that she has been using heroin and cocaine for the past 2 years.  Pt states that this is the first time she came to get help.  Pt states that she was in the [employer] and has pain in her foot where she has metal rods in her foot.  Pt is open to long term treatment.  SW will make appropriate referrals.  No further needs voiced by pt at this time.    [name], [place] 06/22/2012  2:37 PM

## 2012-06-22 NOTE — Progress Notes (Signed)
D:  Patient stayed in her room most of the day.  States she was not able to attend groups this morning as she was too tired.  Tolerating medications well.   A:  Medications given as ordered.  Encouraged fluids.  Encouraged patient to be up and about during the day.  R:  Pleasant and cooperative with staff and peers.
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
## 2012-06-22 NOTE — Progress Notes (Signed)
[facility] Group Notes:  (Counselor/Nursing/MHT/Case Management/Adjunct)  06/22/2012 5:52 PM  Type of Therapy:  Group Therapy from 1:15 to 2:30  Participation Level:  Did Not Attend  Clide Dales 06/22/2012 5:52 PM

## 2012-06-22 NOTE — Tx Team (Signed)
Initial Interdisciplinary Treatment Plan  PATIENT STRENGTHS: (choose at least two) Ability for insight Average or above average intelligence Communication skills General fund of knowledge Religious Affiliation Supportive family/friends  PATIENT STRESSORS: Substance abuse   PROBLEM LIST: Problem List/Patient Goals Date to be addressed Date deferred Reason deferred Estimated date of resolution  Substance Abuse 06-22-12     Depression 06-22-12                                                DISCHARGE CRITERIA:  Improved stabilization in mood, thinking, and/or behavior Verbal commitment to aftercare and medication compliance Withdrawal symptoms are absent or subacute and managed without 24-hour nursing intervention  PRELIMINARY DISCHARGE PLAN: Attend 12-step recovery group Outpatient therapy Participate in family therapy  PATIENT/FAMIILY INVOLVEMENT: This treatment plan has been presented to and reviewed with the patient, [name], and/or family member.  The patient and family have been given the opportunity to ask questions and make suggestions.  [name] 06/22/2012, 3:54 AM
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
## 2012-06-22 NOTE — Progress Notes (Signed)
[facility] Group Notes:  (Counselor/Nursing/MHT/Case Management/Adjunct)  06/22/2012 12:10 PM  Type of Therapy:  Psychoeducational Skills  Participation Level:  Did Not Attend   Summary of Progress/Problems:  [name] did not attend Psychoeducational group that focused on using quality time with support systems/individuals to engage in health coping skills d/t feeling ill.   Wandra Scot 06/22/2012, 12:10 PM

## 2012-06-22 NOTE — Progress Notes (Signed)
D: Pt in bed resting with eyes closed. Respirations even and unlabored. Pt appears to be in no signs of distress at this time. A: Q15min checks remains for this pt. R: Pt remains safe at this time.   

## 2012-06-22 NOTE — Progress Notes (Signed)
Psychoeducational Group Note  Date:  06/22/2012 Time:  1100  Group Topic/Focus:  Recovery Goals:   The focus of this group is to identify appropriate goals for recovery and establish a plan to achieve them.  Participation Level: Did Not Attend  Participation Quality:  Not Applicable  Affect:  Not Applicable  Cognitive:  Not Applicable  Insight:  Not Applicable  Engagement in Group: Not Applicable  Additional Comments:  Pt stated that she did not feel well enough to attend the Group.  Christ Kick 06/22/2012, 3:31 PM
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
## 2012-06-22 NOTE — Progress Notes (Signed)
[facility] MD Progress Note  06/22/2012 2:45 PM  Diagnosis:   Axis I: Substance Induced Mood Disorder and Polysubstance Dependence Axis II: Deferred Axis III:  Past Medical History  Diagnosis Date  . PTSD (post-traumatic stress disorder)   . Depression   . Anxiety   . Ankle pain, chronic    Axis IV: other psychosocial or environmental problems and problems related to social environment Axis V: 31-40 impairment in reality testing  ADL's:  Impaired  Sleep: Poor  Appetite:  Poor  Suicidal Ideation:  Plan:  No Intent:  No Means:  No Homicidal Ideation:  Plan:  No Intent:  No Means:  No  AEB (as evidenced by):  Mental Status Examination/Evaluation: Objective:  Appearance: Disheveled  Eye Contact::  Fair  Speech:  Clear and Coherent  Volume:  Normal  Mood:  Anxious, Depressed, Hopeless, Irritable and Worthless  Affect:  Congruent  Thought Process:  Coherent  Orientation:  Other:  To place, but had to be told the date  Thought Content:  WDL  Suicidal Thoughts:  No  Homicidal Thoughts:  No  Memory:  Immediate;   Fair Recent;   Fair Remote;   Fair  Judgement:  Impaired  Insight:  Fair  Psychomotor Activity:  Normal  Concentration:  Fair  Recall:  Fair  Akathisia:  No  Handed:  Right  AIMS (if indicated):     Assets:  Communication Skills Desire for Improvement  Sleep:      Vital Signs:Blood pressure 107/74, pulse 114, temperature 98.1 F (36.7 C), temperature source Oral, resp. rate 16, height 5\' 8"  (1.727 m), weight 80.74 kg (178 lb), last menstrual period 06/07/2012. Current Medications: Current Facility-Administered Medications  Medication Dose Route Frequency Provider Last Rate Last Dose  . acetaminophen (TYLENOL) tablet 650 mg  650 mg Oral Q6H PRN Sanjuana Kava, NP      . alum & mag hydroxide-simeth (MAALOX/MYLANTA) 200-200-20 MG/5ML suspension 30 mL  30 mL Oral Q4H PRN Sanjuana Kava, NP      . amitriptyline (ELAVIL) tablet 25 mg  25 mg Oral QHS Mike Craze, MD      . cloNIDine (CATAPRES - Dosed in mg/24 hr) patch 0.2 mg  0.2 mg Transdermal Weekly Mike Craze, MD      . dicyclomine (BENTYL) tablet 20 mg  20 mg Oral Q6H PRN Sanjuana Kava, NP      . GRX ANALGESIC BALM OINT 1 application  1 application Apply externally Q6H PRN Mike Craze, MD      . hydrOXYzine (ATARAX/VISTARIL) tablet 25 mg  25 mg Oral Q6H PRN Sanjuana Kava, NP   25 mg at 06/22/12 0405  . loperamide (IMODIUM) capsule 2-4 mg  2-4 mg Oral PRN Sanjuana Kava, NP   4 mg at 06/22/12 1207  . magnesium hydroxide (MILK OF MAGNESIA) suspension 30 mL  30 mL Oral Daily PRN Sanjuana Kava, NP      . meloxicam (MOBIC) tablet 7.5 mg  7.5 mg Oral Daily Mike Craze, MD      . methocarbamol (ROBAXIN) tablet 500 mg  500 mg Oral Q8H PRN Sanjuana Kava, NP      . naproxen (NAPROSYN) tablet 500 mg  500 mg Oral BID PRN Sanjuana Kava, NP   500 mg at 06/22/12 0405  . ondansetron (ZOFRAN-ODT) disintegrating tablet 4 mg  4 mg Oral Q6H PRN Sanjuana Kava, NP   4 mg at 06/22/12 0405  . pantoprazole (  PROTONIX) EC tablet 20 mg  20 mg Oral BID AC Mike Craze, MD      . prazosin (MINIPRESS) capsule 1 mg  1 mg Oral QHS,MR X 1 Mike Craze, MD      . traZODone (DESYREL) tablet 100 mg  100 mg Oral QHS PRN,MR X 1 Sanjuana Kava, NP      . DISCONTD: cloNIDine (CATAPRES) tablet 0.1 mg  0.1 mg Oral QID Sanjuana Kava, NP   0.1 mg at 06/22/12 1207  . DISCONTD: cloNIDine (CATAPRES) tablet 0.1 mg  0.1 mg Oral BH-qamhs Sanjuana Kava, NP      . DISCONTD: cloNIDine (CATAPRES) tablet 0.1 mg  0.1 mg Oral QAC breakfast Sanjuana Kava, NP       Facility-Administered Medications Ordered in Other Encounters  Medication Dose Route Frequency Provider Last Rate Last Dose  . DISCONTD: acetaminophen (TYLENOL) tablet 650 mg  650 mg Oral Q4H PRN Suzi Roots, MD      . DISCONTD: ibuprofen (ADVIL,MOTRIN) tablet 600 mg  600 mg Oral Q8H PRN Suzi Roots, MD   600 mg at 06/21/12 2015  . DISCONTD: LORazepam (ATIVAN) tablet 1  mg  1 mg Oral Q8H PRN Suzi Roots, MD   1 mg at 06/21/12 1731  . DISCONTD: nicotine (NICODERM CQ - dosed in mg/24 hours) patch 21 mg  21 mg Transdermal Daily Suzi Roots, MD   21 mg at 06/21/12 1731  . DISCONTD: ondansetron (ZOFRAN) tablet 4 mg  4 mg Oral Q8H PRN Suzi Roots, MD   4 mg at 06/21/12 1556    Lab Results:  Results for orders placed during the hospital encounter of 06/21/12 (from the past 48 hour(s))  ETHANOL     Status: Normal   Collection Time   06/20/12  8:15 PM      Component Value Range Comment   Alcohol, Ethyl (B) <11  0 - 11 mg/dL   BASIC METABOLIC PANEL     Status: Abnormal   Collection Time   06/20/12  8:15 PM      Component Value Range Comment   Sodium 137  135 - 145 mEq/L    Potassium 4.0  3.5 - 5.1 mEq/L    Chloride 101  96 - 112 mEq/L    CO2 23  19 - 32 mEq/L    Glucose, Bld 109 (*) 70 - 99 mg/dL    BUN 7  6 - 23 mg/dL    Creatinine, Ser 1.61  0.50 - 1.10 mg/dL    Calcium 9.2  8.4 - 09.6 mg/dL    GFR calc non Af Amer >90  >90 mL/min    GFR calc Af Amer >90  >90 mL/min   CBC     Status: Abnormal   Collection Time   06/20/12  8:15 PM      Component Value Range Comment   WBC 7.5  4.0 - 10.5 K/uL    RBC 4.02  3.87 - 5.11 MIL/uL    Hemoglobin 11.8 (*) 12.0 - 15.0 g/dL    HCT 04.5 (*) 40.9 - 46.0 %    MCV 86.3  78.0 - 100.0 fL    MCHC 34.0  30.0 - 36.0 g/dL    RDW 81.1  91.4 - 78.2 %    Platelets 371  150 - 400 K/uL   DIFFERENTIAL     Status: Normal   Collection Time   06/20/12  8:15 PM  Component Value Range Comment   Neutrophils Relative 52  43 - 77 %    Neutro Abs 3.9  1.7 - 7.7 K/uL    Lymphocytes Relative 36  12 - 46 %    Lymphs Abs 2.7  0.7 - 4.0 K/uL    Monocytes Relative 9  3 - 12 %    Monocytes Absolute 0.7  0.1 - 1.0 K/uL    Eosinophils Relative 3  0 - 5 %    Eosinophils Absolute 0.2  0.0 - 0.7 K/uL    Basophils Relative 1  0 - 1 %    Basophils Absolute 0  0.0 - 0.1 K/uL   URINE RAPID DRUG SCREEN (HOSP PERFORMED)     Status:  Abnormal   Collection Time   06/21/12  8:01 PM      Component Value Range Comment   Opiates POSITIVE (*) NONE DETECTED    Cocaine POSITIVE (*) NONE DETECTED    Benzodiazepines NONE DETECTED  NONE DETECTED    Amphetamines NONE DETECTED  NONE DETECTED    Tetrahydrocannabinol NONE DETECTED  NONE DETECTED    Barbiturates NONE DETECTED  NONE DETECTED   PREGNANCY, URINE     Status: Normal   Collection Time   06/21/12  8:01 PM      Component Value Range Comment   Preg Test, Ur NEGATIVE  NEGATIVE     Physical Findings: AIMS: Facial and Oral Movements Muscles of Facial Expression: None, normal Lips and Perioral Area: None, normal Jaw: None, normal Tongue: None, normal,Extremity Movements Lower (legs, knees, ankles, toes): None, normal, Trunk Movements Neck, shoulders, hips: None, normal, Overall Severity Severity of abnormal movements (highest score from questions above): None, normal Incapacitation due to abnormal movements: None, normal Patient's awareness of abnormal movements (rate only patient's report): No Awareness, Dental Status Current problems with teeth and/or dentures?: No Does patient usually wear dentures?: No  CIWA:  CIWA-Ar Total: 1  COWS:  COWS Total Score: 1   Treatment Plan Summary: Daily contact with patient to assess and evaluate symptoms and progress in treatment Medication management No signs/symptoms of withdrawal from abusable substances.  Plan: Admit, switch to clonidine patch, use opiate protocol, use Elavil, Mobic, (Protonix), and Analgesic balm for pain management, as well as Minipress for nightmares.  Discussed the risks, benefits, and probable clinical course with and without treatment.  Pt is agreeable to the current course of treatment.  Raaga Maeder 06/22/2012, 2:45 PM

## 2012-06-22 NOTE — Progress Notes (Signed)
06/22/2012      Time: 1500      Group Topic/Focus: The focus of this group is on discussing various styles of communication and communicating assertively using 'I' (feeling) statements.  Participation Level: Did not attend  Participation Quality: Not Applicable  Affect: Not Applicable  Cognitive: Not Applicable   Additional Comments: Patient not feeling well.    Michell Giuliano 06/22/2012 3:53 PM

## 2012-06-23 NOTE — Progress Notes (Signed)
[facility] Group Notes:  (Counselor/Nursing/MHT/Case Management/Adjunct)  06/23/2012 4:03 PM  Type of Therapy:  Group Therapy at 1:15 to 2:30  Participation Level:  Active  Participation Quality:  Appropriate  Affect:  Appropriate  Cognitive:  Alert, Appropriate and Oriented  Insight:  Good  Engagement in Group:  Good  Engagement in Therapy:  Good  Modes of Intervention:  Clarification, Socialization and Support  Summary of Progress/Problems: The focus of this group session was to process how we deal with difficult emotions and share with others the patterns that play out when we are reacting to the emotion verses the situation. [name] shared that one of the most difficult emotions she deals with is regret that I ever started to use, but that can be a trap for me to use again like "oh well, you already messed up everything you may as well go on and use yourself up girl." [name] also shared how guilt can do the same "but we can rise above."  Patient was helpful in deciphering what other patients were sharing due to their use of slang or [place] accent   [name] 06/23/2012, 4:10 PM

## 2012-06-23 NOTE — Progress Notes (Signed)
D: Pt mood is depressed and affect is flat. Pt interaction with peers is minimal tonight. Pt seems withdrawn and eye contact is minimal. Pt rates her depression is a 5/10.  A: Support given. Verbalization encouraged. Encouraged pt to continue to attend groups and increase interaction.  R: Pt is receptive. No complaints of pain or discomfort at this time. Will continue to monitor.

## 2012-06-23 NOTE — Progress Notes (Signed)
Pt did not attend d/c planning group on this date.  SW met with pt individually at this time.  Pt was resting in bed.  Pt states that she is not doing well today.  Pt states that she has been up all night sick, with diarrhea.  Pt states that she continues to have withdrawal symptoms.  Pt states that she is interested in [facility] but is also considering moving out of state after d/c.  SW provided pt with [facility] information.  SW will continue to monitor appropriate referrals as pt becomes more stable.  No further needs voiced by pt at this time.    Reyes Ivan, LCSWA 06/23/2012  9:18 AM

## 2012-06-23 NOTE — Tx Team (Signed)
Interdisciplinary Treatment Plan Update (Adult)  Date:  06/23/2012  Time Reviewed:  9:55 AM   Progress in Treatment: Attending groups: Yes Participating in groups:  Yes Taking medication as prescribed: Yes Tolerating medication:  Yes Family/Significant othe contact made:  Counselor assessing for appropriate contact Patient understands diagnosis:  Yes Discussing patient identified problems/goals with staff:  Yes Medical problems stabilized or resolved:  Yes Denies suicidal/homicidal ideation: Yes Issues/concerns per patient self-inventory:  None identified Other: N/A  New problem(s) identified: None Identified  Reason for Continuation of Hospitalization: Anxiety Delusions  Medication stabilization Withdrawal symptoms  Interventions implemented related to continuation of hospitalization: mood stabilization, medication monitoring and adjustment, group therapy and psycho education, safety checks q 15 mins  Additional comments: N/A  Estimated length of stay: 3-5 days  Discharge Plan: Pt open to long term treatment.  SW will assess for appropriate referrals.    New goal(s): N/A  Review of initial/current patient goals per problem list:    1.  Goal(s): Address substance use  Met:  No  Target date: by discharge  As evidenced by: completing detox protocol and refer to appropriate treatment  2.  Goal (s): Reduce depressive symptoms  Met:  No  Target date: by discharge  As evidenced by: Reducing depression from a 10 to a 3 as reported by pt.     Attendees: Patient:     Family:     Physician:  Lupe Carney, DO 06/23/2012 9:55 AM   Nursing: Chinita Greenland, RN 06/23/2012 9:55 AM   Case Manager:  Reyes Ivan, LCSWA 06/23/2012  9:55 AM   Counselor:  Ronda Fairly, LCSWA 06/23/2012  9:55 AM   Other:  Richelle Ito, LCSW 06/23/2012 9:55 AM   Other:  Roswell Miners, RN 06/23/2012 9:57 AM   Other:  Felix Pacini D intern 06/23/2012 9:57 AM   Other:      Scribe for  Treatment Team:   Reyes Ivan 06/23/2012 9:55 AM

## 2012-06-23 NOTE — Progress Notes (Signed)
Patient ID: [name], female   DOB: 1986/08/15, 26 y.o.   MRN: [id number] [name]  25 y.o.  [id number] 09/01/1986  06/23/2012   Diagnosis: Opiate dependence  Subjective: Met with the patient and discussed her symptoms. She did report some coryza, yawning, and loose stools.  Otherwise [name] felt that she was ready for discharge as she had minimal symptoms.  The patient tells me she was planning to move out of [place] on the first of the month. She notes that she slept ok, appetite was ok, no depression, SI/HI, denies AH/VH.  Vital Signs:Blood pressure 123/80, pulse 86, temperature 98.6 F (37 C), temperature source Oral, resp. rate 16, height 5\' 8"  (1.727 m), weight 80.74 kg (178 lb), last menstrual period 06/07/2012, SpO2 100.00%.   Objective: Pt. Is alert and oriented x3. Speech is clear and goal directed. Mood is euthymic and affect is congruent.  Sx as above.  No evidence of acute withdrawal. Poor insight, poor judgement.  Assessment: Opiate withdrawal.  Medications Scheduled:  . amitriptyline  25 mg Oral QHS  . cloNIDine  0.2 mg Transdermal Weekly  . meloxicam  7.5 mg Oral Daily  . nicotine  21 mg Transdermal Daily  . pantoprazole  20 mg Oral BID AC  . prazosin  1 mg Oral QHS,MR X 1   PRN Meds acetaminophen, alum & mag hydroxide-simeth, dicyclomine, GRX ANALGESIC BALM, hydrOXYzine, loperamide, magnesium hydroxide, methocarbamol, naproxen, ondansetron, traZODone  Plan: Continue current plan of care with no changes at this time. Patient is encouraged to take more time and avoid a "flight into health." Encourage a strong plan for treatment upon discharge including residential treatment as MD suggested on admission. If patient continues to do well could discharge Friday or Saturday.  [name] 06/23/2012 11:59 PM

## 2012-06-23 NOTE — Progress Notes (Signed)
Patient ID: [name], female   DOB: 1986-07-18, 26 y.o.   MRN: [id number]   D: Patient lying in bed on approach. Stated that she didn't sleep well last night even after the minipress and the elavil. Reports this am having some diarrhea and "the sniffles" as withdrawal symptoms. Reports depression and hopelessness feelings at a "5" on scales. Currently denies any SI or HI. No psychosis noted since admission. Clonidine patch ordered for opiate withdrawals. A: Staff will monitor and encourage group attendance. Given imodium for diarrhea  R: Patient cooperative with staff at this time. Lying down some prior to groups.

## 2012-06-24 LAB — HEPATITIS PANEL, ACUTE
HCV Ab: REACTIVE — AB
Hep A IgM: NEGATIVE
Hep B C IgM: NEGATIVE
Hepatitis B Surface Ag: NEGATIVE

## 2012-06-24 LAB — HIV ANTIBODY (ROUTINE TESTING W REFLEX): HIV: NONREACTIVE

## 2012-06-24 LAB — RPR: RPR Ser Ql: NONREACTIVE

## 2012-06-24 MED ORDER — MELOXICAM 7.5 MG PO TABS: 7.5000 mg | ORAL_TABLET | Freq: Every day | ORAL | Status: DC

## 2012-06-24 MED ORDER — AMITRIPTYLINE HCL 25 MG PO TABS: 25.0000 mg | ORAL_TABLET | Freq: Every day | ORAL | Status: DC

## 2012-06-24 MED ORDER — PRAZOSIN HCL 1 MG PO CAPS: 1.0000 mg | ORAL_CAPSULE | Freq: Every evening | ORAL | Status: DC | PRN

## 2012-06-24 MED ORDER — TRAZODONE HCL 100 MG PO TABS: 100.0000 mg | ORAL_TABLET | Freq: Every evening | ORAL | Status: DC | PRN

## 2012-06-24 NOTE — Tx Team (Signed)
Date: 06/24/2012  Time Reviewed: 9:55 AM  Progress in Treatment:  Attending groups: Yes  Participating in groups: Yes  Taking medication as prescribed: Yes  Tolerating medication: Yes  Family/Significant othe contact made: Counselor to call friend prior to discharge Patient understands diagnosis: Yes  Discussing patient identified problems/goals with staff: Yes  Medical problems stabilized or resolved: Yes  Denies suicidal/homicidal ideation: Yes  Issues/concerns per patient self-inventory: None identified  Other: N/A  New problem(s) identified: None Identified    Additional comments: N/A  Estimated length of stay: Discharge Today Discharge Plan: Pt open to long term treatment. SW will assess for appropriate referrals.  New goal(s): N/A  Review of initial/current patient goals per problem list:  1. Goal(s): Address substance use  Met: Yes Target date: by discharge  As evidenced by: completing detox protocol and [facility] 2. Goal (s): Reduce depressive symptoms  Met: Yet  As evidenced by: Reducing depression from a 10 to a 3 as reported by pt.  Attendees:  Patient: [name] 06/24/2012 9:55 AM   Family:    Physician: Lupe Carney, DO  06/24/2012 9:55 AM   Nursing: Izola Price , RN  06/24/2012 9:55 AM   Case Manager: Barrie Folk RN MS EdS 06/24/2012 9:55 AM   Counselor: Ronda Fairly, LCSWA  06/24/2012 9:55 AM   Other: Abbie Sons RN 06/24/2012 9:55 AM   Other: Roswell Miners, RN  06/24/2012 9:57 AM   Other: Omelia Blackwater RN 06/24/2012 9:57 AM   Other:    Scribe for Treatment Team:  Barrie Folk RN MSEdS 06/24/2012 9:55 AM

## 2012-06-24 NOTE — Care Management (Signed)
Case Management Discharge Left message with [facility] requesting a hospital follow-up apt for patient. CM left patient's cell phone message along with [facility] phone number requesting a call back with apt time and date prior to patient's discharge later today. Writer let patient know that [facility] if [facility] do not return writer's call. Writer also gave patient information on services available at [facility] in [place] and patient instructed to present at their Walk-in clinic if she feels that she would benefit from their services or dual diagnosis group offered there. Patient signed consents for both [place] and [facility]. NA group schedule placed in chart to be given to patient by nursing staff at discharge. Patient states that her ride will be at [facility] to pick her up at 1 pm. Patient will leave with a supply of medications. No further case management needs voiced. Denis SI/HI. Rates depression,anxiety and hopelessness at 0. Joice Lofts RN MS EdS 06/24/2012  11:51 AM

## 2012-06-24 NOTE — Progress Notes (Signed)
Writer made multiple attempts to obtain phone numbers for Janalyn Rouse without success; also left message for friend Marquis Lunch at [phone].  Writer ultimately provided suicide prevention education directly to patient; conversation included risk factors, warning signs and resources to contact for help. Mobile crisis services explained and contact card placed in chart for pt to receive at discharge.   Clide Dales 06/24/2012 12:12 PM
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
## 2012-06-24 NOTE — Care Management (Signed)
Met with patient in Aftercare Planning Group and provided today's workbook based on Leisure and Lifestyle changes. Patient was pleasant, smiling, and interacted well with group, hyper-verbal at times but redirectable. During Aftercare Planning Group, Case Manager provided psychoeducation on "Suicide Prevention Information." This included descriptions of risk factors for suicide, warning signs that an individual is in crisis and thinking of suicide, and what to do if this occurs. Pt indicated understanding of information provided, and will read brochure given upon discharge. Patient participated actively in the discussion and although her answers were not always pertaining to the subject, she tried very hard to contribute. Patient eager for discharge stating, "I came here to get off drugs safely and I have done that. I want out of here."  Patient brought in to tx team and states that she was open to NA groups and would like follow-up at [facility]. Patient has transportation, a home and access to medications through [place]. Denies SI/HI and Psychosis. Joice Lofts RN MS EdS 06/24/2012  10:37 AM

## 2012-06-24 NOTE — Discharge Summary (Signed)
Physician Discharge Summary Note  Patient:  [name] is an 26 y.o., female MRN:  [id number] DOB:  1986/03/20 Patient phone:  [phone] (home)  Patient address:   [address]  [place] [address]   Date of Admission:  06/22/2012 Date of Discharge: 06/24/2012  Discharge Diagnoses: Principal Problem:  *Polysubstance dependence including opioid type drug, continuous use Active Problems:  Substance induced mood disorder  Axis Diagnosis:  Opiate dependence  Level of Care:  OP  Hospital Course:      Kazzandra Was admitted for detox from opiates and crisis management.  She was treated with the standard Clonodine protocol.  Medical problems were identified and treated.  Home medication was restarted as appropriate. 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  Labs are available.     Improvement was monitored by COWS scores and patient's daily report of withdrawal symptom reduction. Emotional and mental status was monitored by daily self inventory reports completed by the patient and clinical staff.      The patient was evaluated by the treatment team for stability and plans for continued recovery upon discharge. She was offered further treatment options upon discharge including Residential, IOP, and Outpatient treatment.  The patient's motivation was an integral factor for scheduling further treatment.  Employment, transportation, bed availability, health status, family support, and any pending legal issues were also considered.    Upon completion of detox the patient was both mentally and medically stable for discharge.   Follow-up Information    Follow up with [facility]. (July 01, 2012 9:30 AM with Dr. Katrinka Blazing)    Contact information:   [facility] [address]  [place], [place] [address]  [phone]       Follow up with [facility]. [facility] M-F 8:30 -3 PM  to be seen for  Mental Health Follow-up)    Contact  information:   [address] [place] [address] [phone]       Follow up with 90 NA meetings in 90 days. (NA schedule provided at discharge)          Upon discharge, patient adamantly denies suicidal, homicidal ideations, auditory, visual hallucinations and or delusional thinking. They left [facility] with all personal belongings via private transportation in no apparent distress.  Consults:  None  Significant Diagnostic Studies:  labs: HIV, RPR, GC/CZ, Acute Hepatitis panel.  Discharge Vitals:   Blood pressure 90/50, pulse 80, temperature 98 F (36.7 C), temperature source Oral, resp. rate 18, height 5\' 8"  (1.727 m), weight 80.74 kg (178 lb), last menstrual period 06/07/2012, SpO2 100.00%..  Mental Status Exam: See Mental Status Examination and Suicide Risk Assessment completed by Attending Physician prior to discharge.  Discharge destination:  Home  Is patient on multiple antipsychotic therapies at discharge:  No  Has Patient had three or more failed trials of antipsychotic monotherapy by history: N/A Recommended Plan for Multiple Antipsychotic Therapies: N/A Discharge Orders    Future Orders Please Complete By Expires   Diet - low sodium heart healthy      Increase activity slowly      Discharge instructions      Comments:   Take all of your medications as prescribed.  Be sure to keep ALL follow up appointments as scheduled. This is to ensure getting your refills on time to avoid any interruption in your medication.  If you find that you can not keep your appointment,  call the clinic and reschedule. Be sure to tell the nurse if you will need a refill before your appointment.     Medication List  As of 06/24/2012  2:25 PM   STOP taking these medications         acetaminophen 500 MG tablet         TAKE these medications      Indication    meloxicam 7.5 MG tablet   Commonly known as: MOBIC   Take 1 tablet (7.5 mg total) by mouth daily. For pain.    Indication:  Joint Damage causing Pain and Loss of Function      traZODone 100 MG tablet   Commonly known as: DESYREL   Take 1 tablet (100 mg total) by mouth at bedtime as needed and may repeat dose one time if needed for sleep.    Indication: Trouble Sleeping           Follow-up Information    Follow up with [facility]. (July 01, 2012 9:30 AM with Dr. Katrinka Blazing)    Contact information:   [facility] [address]  [place], [place] [address]  [phone]     Be sure to discuss the results of your lab work with your provider as further testing may be needed.   Follow up with [facility]. [facility] M-F 8:30 -3 PM  to be seen for  Mental Health Follow-up)    Contact information:   [address] [place] [address] [phone]       Follow up with 90 NA meetings in 90 days. (NA schedule provided at discharge)         Follow-up recommendations:   Activities: Resume typical activities Diet: Resume typical diet Other: Follow up with outpatient provider and report any side effects to out patient prescriber.  Comments:  Take all your medications as prescribed by your mental healthcare provider. Report any adverse effects and or reactions from your medicines to your outpatient provider promptly. Patient is instructed and cautioned to not engage in alcohol and or illegal drug use while on prescription medicines. In the event of worsening symptoms, patient is instructed to call the crisis hotline, 911 and or go to the nearest ED for appropriate evaluation and treatment of symptoms.  Signed: Rona Ravens. Eupha Lobb PAC For Dr. Lupe Carney 06/24/2012 2:25 PM

## 2012-06-24 NOTE — Progress Notes (Signed)
Psychoeducational Group Note  Date:  06/24/2012 Time:  1100  Group Topic/Focus:  Overcoming Stress:   The focus of this group is to define stress and help patients assess their triggers.  Participation Level:  Active  Participation Quality:  Appropriate and Sharing  Affect:  Appropriate  Cognitive:  Appropriate  Insight:  Good  Engagement in Group:  Good  Additional Comments:  none  Kylar Speelman M 06/24/2012, 1:14 PM

## 2012-06-24 NOTE — Progress Notes (Signed)
D:  Patient discharged to home today.  States she will be staying with her grandfather for a while and will follow up with the [place].  Also plans on attending NA meetings.  Schedule provided.  All belongings retrieved from room and from the locker out front.  Reviewed all discharge instructions, medications, and follow up care.  Two week supply of medications given from hospital pharmacy.  Patient left the unit ambulatory and was escorted to the front lobby where her ride was waiting for her.  A:  Reviewed all instructions with patient and offered her the opportunity to ask questions.   R:  Patient states she is feeling more hopeful and will be staying in sober living when she leaves.  States she feels ready for discharge.

## 2012-06-24 NOTE — Care Management (Signed)
Patient scheduled for [facility] Follow-up appointment on July 01, 2012 @ 9:30 Am with Dr. Katrinka Blazing. Joice Lofts RN MS EdS 06/24/2012  12:52 PM

## 2012-06-25 NOTE — Progress Notes (Signed)
Patient Discharge Instructions:  After Visit Summary (AVS):   Faxed to:  06/24/2012 Psychiatric Admission Assessment Note:   Faxed to:  06/24/2012 Suicide Risk Assessment - Discharge Assessment:   Faxed to:  06/24/2012 Faxed/Sent to the Next Level Care provider:  06/24/2012  Faxed to [facility] - Dr. Katrinka Blazing @ [phone] And to [facility] @ [phone]  Wandra Scot, 06/25/2012, 3:25 PM

## 2012-07-21 ENCOUNTER — Encounter (HOSPITAL_COMMUNITY): Payer: Self-pay | Admitting: *Deleted

## 2012-07-21 ENCOUNTER — Emergency Department (HOSPITAL_COMMUNITY)
Admission: EM | Admit: 2012-07-21 | Discharge: 2012-07-23 | Disposition: A | Discharge status: Home / Self Care | Payer: Non-veteran care | Attending: Emergency Medicine | Admitting: Emergency Medicine

## 2012-07-21 DIAGNOSIS — Z9089 Acquired absence of other organs: Secondary | ICD-10-CM | POA: Insufficient documentation

## 2012-07-21 DIAGNOSIS — F341 Dysthymic disorder: Secondary | ICD-10-CM | POA: Insufficient documentation

## 2012-07-21 DIAGNOSIS — F1120 Opioid dependence, uncomplicated: Secondary | ICD-10-CM | POA: Insufficient documentation

## 2012-07-21 DIAGNOSIS — F17200 Nicotine dependence, unspecified, uncomplicated: Secondary | ICD-10-CM | POA: Insufficient documentation

## 2012-07-21 DIAGNOSIS — F1920 Other psychoactive substance dependence, uncomplicated: Secondary | ICD-10-CM | POA: Insufficient documentation

## 2012-07-21 DIAGNOSIS — G8929 Other chronic pain: Secondary | ICD-10-CM | POA: Insufficient documentation

## 2012-07-21 DIAGNOSIS — M25579 Pain in unspecified ankle and joints of unspecified foot: Secondary | ICD-10-CM | POA: Insufficient documentation

## 2012-07-21 LAB — CBC
HCT: 36.7 % (ref 36.0–46.0)
MCH: 29.1 pg (ref 26.0–34.0)
MCV: 84.2 fL (ref 78.0–100.0)
Platelets: 354 10*3/uL (ref 150–400)
RDW: 13.3 % (ref 11.5–15.5)

## 2012-07-21 LAB — COMPREHENSIVE METABOLIC PANEL
AST: 53 U/L — ABNORMAL HIGH (ref 0–37)
Albumin: 3.7 g/dL (ref 3.5–5.2)
BUN: 5 mg/dL — ABNORMAL LOW (ref 6–23)
Calcium: 9.2 mg/dL (ref 8.4–10.5)
Creatinine, Ser: 0.88 mg/dL (ref 0.50–1.10)
GFR calc non Af Amer: 89 mL/min — ABNORMAL LOW (ref >90)
Total Bilirubin: 0.4 mg/dL (ref 0.3–1.2)

## 2012-07-21 LAB — ETHANOL: Alcohol, Ethyl (B): <11 mg/dL (ref 0–11)

## 2012-07-21 LAB — RAPID URINE DRUG SCREEN, HOSP PERFORMED
Amphetamines: NOT DETECTED
Cocaine: POSITIVE — AB
Opiates: POSITIVE — AB
Tetrahydrocannabinol: NOT DETECTED

## 2012-07-21 MED ORDER — ACETAMINOPHEN 325 MG PO TABS: 650.0000 mg | ORAL_TABLET | ORAL | Status: DC | PRN

## 2012-07-21 MED ORDER — IBUPROFEN 600 MG PO TABS: 600.0000 mg | ORAL_TABLET | Freq: Three times a day (TID) | ORAL | Status: DC | PRN

## 2012-07-21 MED ORDER — CLONIDINE HCL 0.1 MG PO TABS: 0.1000 mg | ORAL_TABLET | ORAL | Status: DC

## 2012-07-21 MED ORDER — LORAZEPAM 1 MG PO TABS
1.0000 mg | ORAL_TABLET | Freq: Three times a day (TID) | ORAL | Status: DC | PRN
  Administered 2012-07-21 – 2012-07-22 (×2): 1 mg via ORAL
  Filled 2012-07-21 (×3): qty 1

## 2012-07-21 MED ORDER — CLONIDINE HCL 0.1 MG PO TABS
0.1000 mg | ORAL_TABLET | Freq: Four times a day (QID) | ORAL | Status: DC
  Administered 2012-07-21 – 2012-07-22 (×6): 0.1 mg via ORAL
  Filled 2012-07-21 (×6): qty 1

## 2012-07-21 MED ORDER — ALUM & MAG HYDROXIDE-SIMETH 200-200-20 MG/5ML PO SUSP: 30.0000 mL | ORAL | Status: DC | PRN

## 2012-07-21 MED ORDER — METHOCARBAMOL 500 MG PO TABS: 500.0000 mg | ORAL_TABLET | Freq: Three times a day (TID) | ORAL | Status: DC | PRN

## 2012-07-21 MED ORDER — NICOTINE 21 MG/24HR TD PT24
21.0000 mg | MEDICATED_PATCH | Freq: Every day | TRANSDERMAL | Status: DC
  Administered 2012-07-21 – 2012-07-22 (×2): 21 mg via TRANSDERMAL
  Filled 2012-07-21 (×2): qty 1

## 2012-07-21 MED ORDER — ONDANSETRON HCL 4 MG PO TABS
4.0000 mg | ORAL_TABLET | Freq: Three times a day (TID) | ORAL | Status: DC | PRN
  Administered 2012-07-21 – 2012-07-22 (×3): 4 mg via ORAL
  Filled 2012-07-21 (×3): qty 1

## 2012-07-21 MED ORDER — DICYCLOMINE HCL 20 MG PO TABS
20.0000 mg | ORAL_TABLET | Freq: Four times a day (QID) | ORAL | Status: DC | PRN
  Administered 2012-07-22: 20 mg via ORAL
  Filled 2012-07-21: qty 1

## 2012-07-21 MED ORDER — LOPERAMIDE HCL 2 MG PO CAPS
2.0000 mg | ORAL_CAPSULE | ORAL | Status: DC | PRN
  Administered 2012-07-22: 2 mg via ORAL
  Filled 2012-07-21: qty 1

## 2012-07-21 MED ORDER — HYDROXYZINE HCL 25 MG PO TABS: 25.0000 mg | ORAL_TABLET | Freq: Four times a day (QID) | ORAL | Status: DC | PRN

## 2012-07-21 MED ORDER — CLONIDINE HCL 0.1 MG PO TABS: 0.1000 mg | ORAL_TABLET | Freq: Every day | ORAL | Status: DC

## 2012-07-21 NOTE — ED Provider Notes (Signed)
History     CSN: [id number]  Arrival date & time 07/21/12  1423   First MD Initiated Contact with Patient 07/21/12 1514      No chief complaint on file. CC: Heroin use  (Consider location/radiation/quality/duration/timing/severity/associated sxs/prior treatment) HPI Comments: Patient presents with request of detox from heroin. Patient last used heroin approximately one hour prior to arrival. Patient was in behavioral health for several days in July 2013 for detox. She currently denies any associated symptoms. She denies thoughts of hurting herself or others. Onset insidious course is constant.  Patient is a 26 y.o. female presenting with drug problem. The history is provided by the patient.  Drug Problem This is a recurrent problem. The current episode started more than 1 month ago. The problem occurs constantly. The problem has been unchanged. Pertinent negatives include no abdominal pain, chest pain, coughing, fever, headaches, myalgias, nausea, rash, sore throat or vomiting. Nothing aggravates the symptoms. She has tried nothing for the symptoms.    Past Medical History  Diagnosis Date  . PTSD (post-traumatic stress disorder)   . Depression   . Anxiety   . Ankle pain, chronic     Past Surgical History  Procedure Date  . Appendectomy   . Tubal ligation     2009  . Ankle surgery     No family history on file.  History  Substance Use Topics  . Smoking status: Current Everyday Smoker -- 0.5 packs/day    Types: Cigarettes  . Smokeless tobacco: Not on file  . Alcohol Use: No     occasionally    OB History    Grav Para Term Preterm Abortions TAB SAB Ect Mult Living                  Review of Systems  Constitutional: Negative for fever.  HENT: Negative for sore throat and rhinorrhea.   Eyes: Negative for redness.  Respiratory: Negative for cough.   Cardiovascular: Negative for chest pain.  Gastrointestinal: Negative for nausea, vomiting, abdominal pain and  diarrhea.  Genitourinary: Negative for dysuria.  Musculoskeletal: Negative for myalgias.  Skin: Negative for rash.  Neurological: Negative for headaches.    Allergies  Penicillins cross reactors  Home Medications  No current outpatient prescriptions on file.  BP 140/71  Pulse 92  Temp 98.3 F (36.8 C) (Oral)  Resp 14  SpO2 100%  LMP 07/18/2012  Physical Exam  Nursing note and vitals reviewed. Constitutional: She appears well-developed and well-nourished.  HENT:  Head: Normocephalic and atraumatic.  Eyes: Conjunctivae are normal. Right eye exhibits no discharge. Left eye exhibits no discharge.  Neck: Normal range of motion. Neck supple.  Cardiovascular: Normal rate, regular rhythm and normal heart sounds.   Pulmonary/Chest: Effort normal and breath sounds normal.  Abdominal: Soft. There is no tenderness.  Neurological: She is alert.  Skin: Skin is warm and dry.  Psychiatric: She has a normal mood and affect.    ED Course  Procedures (including critical care time)  Labs Reviewed  COMPREHENSIVE METABOLIC PANEL - Abnormal; Notable for the following:    Potassium 3.4 (*)     Glucose, Bld 141 (*)     BUN 5 (*)     AST 53 (*)     ALT 73 (*)     GFR calc non Af Amer 89 (*)     All other components within normal limits  URINE RAPID DRUG SCREEN (HOSP PERFORMED) - Abnormal; Notable for the following:    Opiates  POSITIVE (*)     Cocaine POSITIVE (*)     All other components within normal limits  CBC  ETHANOL  POCT PREGNANCY, URINE   No results found.   1. Polysubstance dependence including opioid type drug, continuous use     3:23 PM Patient seen and examined. Work-up initiated. Holding orders completed.    Vital signs reviewed and are as follows: Filed Vitals:   07/21/12 1436  BP: 140/71  Pulse: 92  Temp: 98.3 F (36.8 C)  Resp: 14   5:54 PM Spoke with ACT who will see. Patient is medically cleared.    MDM  Pending ACT eval. Patient is voluntary.           Renne Crigler, [name] 07/21/12 1755

## 2012-07-21 NOTE — ED Provider Notes (Signed)
Medical screening examination/treatment/procedure(s) were performed by non-physician practitioner and as supervising physician I was immediately available for consultation/collaboration.  [name], MD 07/21/12 [phone]

## 2012-07-21 NOTE — ED Notes (Signed)
Pt voluntarily wanting detox from heroin. Last use approx 1 hour ago. States here in July for same. Denies SI/HI, no hallucinations, no withdrawal symptoms. Pt placed at [facility] in July.
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
## 2012-07-22 MED ORDER — POTASSIUM CHLORIDE CRYS ER 20 MEQ PO TBCR
40.0000 meq | EXTENDED_RELEASE_TABLET | Freq: Once | ORAL | Status: AC
  Administered 2012-07-22: 40 meq via ORAL
  Filled 2012-07-22: qty 2

## 2012-07-22 NOTE — ED Provider Notes (Signed)
Filed Vitals:   07/22/12 1351  BP: 105/69  Pulse: 84  Temp:   Resp: 16   Continues to await placement for detox.  Cyndra Numbers, MD 07/22/12 [phone]

## 2013-03-28 ENCOUNTER — Emergency Department (HOSPITAL_COMMUNITY): Payer: Non-veteran care

## 2013-03-28 ENCOUNTER — Encounter (HOSPITAL_COMMUNITY): Payer: Self-pay | Admitting: *Deleted

## 2013-03-28 ENCOUNTER — Inpatient Hospital Stay (HOSPITAL_COMMUNITY)
Admission: EM | Admit: 2013-03-28 | Discharge: 2013-03-30 | DRG: 690 | Disposition: A | Discharge status: Home / Self Care | Payer: Non-veteran care | Attending: Internal Medicine | Admitting: Internal Medicine

## 2013-03-28 DIAGNOSIS — F1920 Other psychoactive substance dependence, uncomplicated: Secondary | ICD-10-CM

## 2013-03-28 DIAGNOSIS — Z880 Allergy status to penicillin: Secondary | ICD-10-CM

## 2013-03-28 DIAGNOSIS — F1120 Opioid dependence, uncomplicated: Secondary | ICD-10-CM

## 2013-03-28 DIAGNOSIS — F17200 Nicotine dependence, unspecified, uncomplicated: Secondary | ICD-10-CM | POA: Diagnosis present

## 2013-03-28 DIAGNOSIS — A498 Other bacterial infections of unspecified site: Secondary | ICD-10-CM | POA: Diagnosis present

## 2013-03-28 DIAGNOSIS — F3289 Other specified depressive episodes: Secondary | ICD-10-CM

## 2013-03-28 DIAGNOSIS — F1994 Other psychoactive substance use, unspecified with psychoactive substance-induced mood disorder: Secondary | ICD-10-CM

## 2013-03-28 DIAGNOSIS — N10 Acute tubulo-interstitial nephritis: Principal | ICD-10-CM | POA: Diagnosis present

## 2013-03-28 DIAGNOSIS — N12 Tubulo-interstitial nephritis, not specified as acute or chronic: Secondary | ICD-10-CM | POA: Diagnosis present

## 2013-03-28 DIAGNOSIS — F1910 Other psychoactive substance abuse, uncomplicated: Secondary | ICD-10-CM | POA: Diagnosis present

## 2013-03-28 DIAGNOSIS — A499 Bacterial infection, unspecified: Secondary | ICD-10-CM | POA: Diagnosis present

## 2013-03-28 DIAGNOSIS — F4310 Post-traumatic stress disorder, unspecified: Secondary | ICD-10-CM | POA: Diagnosis present

## 2013-03-28 DIAGNOSIS — N760 Acute vaginitis: Secondary | ICD-10-CM | POA: Diagnosis present

## 2013-03-28 DIAGNOSIS — B182 Chronic viral hepatitis C: Secondary | ICD-10-CM | POA: Diagnosis present

## 2013-03-28 DIAGNOSIS — E871 Hypo-osmolality and hyponatremia: Secondary | ICD-10-CM | POA: Diagnosis present

## 2013-03-28 DIAGNOSIS — F329 Major depressive disorder, single episode, unspecified: Secondary | ICD-10-CM

## 2013-03-28 DIAGNOSIS — D892 Hypergammaglobulinemia, unspecified: Secondary | ICD-10-CM | POA: Diagnosis present

## 2013-03-28 DIAGNOSIS — B9689 Other specified bacterial agents as the cause of diseases classified elsewhere: Secondary | ICD-10-CM | POA: Diagnosis present

## 2013-03-28 DIAGNOSIS — Z9089 Acquired absence of other organs: Secondary | ICD-10-CM

## 2013-03-28 LAB — URINALYSIS, ROUTINE W REFLEX MICROSCOPIC
Nitrite: POSITIVE — AB
Specific Gravity, Urine: 1.017 (ref 1.005–1.030)
Urobilinogen, UA: 0.2 mg/dL (ref 0.0–1.0)
pH: 5.5 (ref 5.0–8.0)

## 2013-03-28 LAB — HIV ANTIBODY (ROUTINE TESTING W REFLEX): HIV: NONREACTIVE

## 2013-03-28 LAB — URINE MICROSCOPIC-ADD ON

## 2013-03-28 LAB — LIPASE, BLOOD: Lipase: 11 U/L (ref 11–59)

## 2013-03-28 LAB — CBC WITH DIFFERENTIAL/PLATELET
Basophils Absolute: 0 10*3/uL (ref 0.0–0.1)
Eosinophils Absolute: 0 10*3/uL (ref 0.0–0.7)
HCT: 35.5 % — ABNORMAL LOW (ref 36.0–46.0)
Lymphocytes Relative: 12 % (ref 12–46)
MCH: 28.4 pg (ref 26.0–34.0)
MCHC: 35.2 g/dL (ref 30.0–36.0)
Monocytes Absolute: 1.5 10*3/uL — ABNORMAL HIGH (ref 0.1–1.0)
Neutro Abs: 20.5 10*3/uL — ABNORMAL HIGH (ref 1.7–7.7)
RDW: 13 % (ref 11.5–15.5)

## 2013-03-28 LAB — COMPREHENSIVE METABOLIC PANEL
Albumin: 3.2 g/dL — ABNORMAL LOW (ref 3.5–5.2)
BUN: 13 mg/dL (ref 6–23)
Calcium: 9.6 mg/dL (ref 8.4–10.5)
Creatinine, Ser: 0.9 mg/dL (ref 0.50–1.10)
GFR calc Af Amer: >90 mL/min (ref >90)
Glucose, Bld: 126 mg/dL — ABNORMAL HIGH (ref 70–99)
Potassium: 4.4 mEq/L (ref 3.5–5.1)
Total Protein: 8.9 g/dL — ABNORMAL HIGH (ref 6.0–8.3)

## 2013-03-28 LAB — ETHANOL: Alcohol, Ethyl (B): <11 mg/dL (ref 0–11)

## 2013-03-28 LAB — RPR: RPR Ser Ql: NONREACTIVE

## 2013-03-28 LAB — RAPID URINE DRUG SCREEN, HOSP PERFORMED: Amphetamines: NOT DETECTED

## 2013-03-28 MED ORDER — CIPROFLOXACIN IN D5W 400 MG/200ML IV SOLN
400.0000 mg | Freq: Two times a day (BID) | INTRAVENOUS | Status: DC
  Administered 2013-03-28 – 2013-03-30 (×4): 400 mg via INTRAVENOUS
  Filled 2013-03-28 (×6): qty 200

## 2013-03-28 MED ORDER — ENOXAPARIN SODIUM 40 MG/0.4ML ~~LOC~~ SOLN
40.0000 mg | SUBCUTANEOUS | Status: DC
  Administered 2013-03-28 – 2013-03-29 (×2): 40 mg via SUBCUTANEOUS
  Filled 2013-03-28 (×3): qty 0.4

## 2013-03-28 MED ORDER — ONDANSETRON HCL 4 MG/2ML IJ SOLN
4.0000 mg | INTRAMUSCULAR | Status: AC
  Administered 2013-03-28: 4 mg via INTRAVENOUS
  Filled 2013-03-28: qty 2

## 2013-03-28 MED ORDER — MORPHINE SULFATE 4 MG/ML IJ SOLN
2.0000 mg | Freq: Once | INTRAMUSCULAR | Status: AC
  Administered 2013-03-28: 2 mg via INTRAVENOUS
  Filled 2013-03-28: qty 1

## 2013-03-28 MED ORDER — MORPHINE SULFATE 2 MG/ML IJ SOLN
2.0000 mg | INTRAMUSCULAR | Status: DC | PRN
  Administered 2013-03-28 – 2013-03-29 (×4): 2 mg via INTRAVENOUS
  Filled 2013-03-28 (×4): qty 1

## 2013-03-28 MED ORDER — ONDANSETRON HCL 4 MG/2ML IJ SOLN
4.0000 mg | Freq: Four times a day (QID) | INTRAMUSCULAR | Status: DC | PRN
  Administered 2013-03-28 – 2013-03-29 (×2): 4 mg via INTRAVENOUS
  Filled 2013-03-28 (×2): qty 2

## 2013-03-28 MED ORDER — ACETAMINOPHEN 325 MG PO TABS
650.0000 mg | ORAL_TABLET | Freq: Four times a day (QID) | ORAL | Status: DC | PRN
  Administered 2013-03-28: 650 mg via ORAL

## 2013-03-28 MED ORDER — SODIUM CHLORIDE 0.9 % IV SOLN
INTRAVENOUS | Status: DC
  Administered 2013-03-28 – 2013-03-30 (×5): via INTRAVENOUS

## 2013-03-28 MED ORDER — DIPHENHYDRAMINE HCL 50 MG/ML IJ SOLN
25.0000 mg | Freq: Once | INTRAMUSCULAR | Status: AC
  Administered 2013-03-29: 25 mg via INTRAVENOUS
  Filled 2013-03-28: qty 1

## 2013-03-28 MED ORDER — IOHEXOL 300 MG/ML  SOLN
100.0000 mL | Freq: Once | INTRAMUSCULAR | Status: AC | PRN
  Administered 2013-03-28: 80 mL via INTRAVENOUS

## 2013-03-28 MED ORDER — SODIUM CHLORIDE 0.9 % IV BOLUS (SEPSIS)
500.0000 mL | Freq: Once | INTRAVENOUS | Status: AC
  Administered 2013-03-28: 500 mL via INTRAVENOUS

## 2013-03-28 MED ORDER — ACETAMINOPHEN 650 MG RE SUPP: 650.0000 mg | Freq: Four times a day (QID) | RECTAL | Status: DC | PRN

## 2013-03-28 MED ORDER — IOHEXOL 300 MG/ML  SOLN
25.0000 mL | INTRAMUSCULAR | Status: AC
  Administered 2013-03-28 (×2): 25 mL via ORAL

## 2013-03-28 MED ORDER — SODIUM CHLORIDE 0.9 % IV SOLN
Freq: Once | INTRAVENOUS | Status: AC
  Administered 2013-03-28: 13:00:00 via INTRAVENOUS

## 2013-03-28 MED ORDER — ONDANSETRON HCL 4 MG PO TABS: 4.0000 mg | ORAL_TABLET | Freq: Four times a day (QID) | ORAL | Status: DC | PRN

## 2013-03-28 NOTE — ED Provider Notes (Signed)
History     CSN: [id number]  Arrival date & time 03/28/13  0913   First MD Initiated Contact with Patient 03/28/13 1112      Chief Complaint  Patient presents with  . Abdominal Pain  . Emesis    (Consider location/radiation/quality/duration/timing/severity/associated sxs/prior treatment) HPI  The patient is a 27 year old female with a past medical history significant for s/p appendectomy and tubal ligation presenting for four days of constant dull 8/10 LLQ pain w/ radiation to back. No alleviating or aggravating factors. Associated subjective fevers and chills and nausea. Denies vomiting, diarrhea, recent IVDA. LMP unknown.   Past Medical History  Diagnosis Date  . PTSD (post-traumatic stress disorder)   . Depression   . Anxiety   . Ankle pain, chronic     Past Surgical History  Procedure Laterality Date  . Appendectomy    . Tubal ligation      2009  . Ankle surgery      No family history on file.  History  Substance Use Topics  . Smoking status: Current Every Day Smoker -- 0.50 packs/day    Types: Cigarettes  . Smokeless tobacco: Not on file  . Alcohol Use: No     Comment: occasionally    OB History   Grav Para Term Preterm Abortions TAB SAB Ect Mult Living                  Review of Systems  Constitutional: Positive for fever and chills.  Eyes: Negative for visual disturbance.  Respiratory: Negative for shortness of breath.   Cardiovascular: Negative for chest pain.  Gastrointestinal: Positive for nausea and abdominal pain. Negative for vomiting, diarrhea and constipation.  Genitourinary: Positive for urgency, frequency, flank pain and vaginal discharge. Negative for hematuria and vaginal bleeding.  Musculoskeletal: Negative.   Skin: Negative.   Neurological: Positive for light-headedness and headaches.    Allergies  Penicillins cross reactors  Home Medications  No current outpatient prescriptions on file.  BP 104/80  Pulse 117  Temp(Src)  99.3 F (37.4 C) (Oral)  Resp 20  SpO2 97%  Physical Exam  Constitutional: She is oriented to person, place, and time. She appears well-developed and well-nourished.  HENT:  Head: Normocephalic and atraumatic.  Mouth/Throat: No oropharyngeal exudate.  Eyes: Conjunctivae and EOM are normal. Pupils are equal, round, and reactive to light.  Neck: Neck supple.  Cardiovascular: Normal rate, regular rhythm and normal heart sounds.   Pulmonary/Chest: Effort normal and breath sounds normal.  Abdominal: Soft. Bowel sounds are normal. There is tenderness in the suprapubic area and left lower quadrant. There is guarding.  Neurological: She is alert and oriented to person, place, and time.  Skin: Skin is warm and dry.  Fresh track marks noted on left hand and forearm w/o drainage  Psychiatric: She has a normal mood and affect.    ED Course  Procedures (including critical care time)  Labs Reviewed  CBC WITH DIFFERENTIAL - Abnormal; Notable for the following:    WBC 25.0 (*)    HCT 35.5 (*)    Platelets 411 (*)    Neutrophils Relative 82 (*)    Neutro Abs 20.5 (*)    Monocytes Absolute 1.5 (*)    All other components within normal limits  COMPREHENSIVE METABOLIC PANEL - Abnormal; Notable for the following:    Sodium 133 (*)    Glucose, Bld 126 (*)    Total Protein 8.9 (*)    Albumin 3.2 (*)    GFR  calc non Af Amer 87 (*)    All other components within normal limits  URINALYSIS, ROUTINE W REFLEX MICROSCOPIC - Abnormal; Notable for the following:    APPearance CLOUDY (*)    Hgb urine dipstick MODERATE (*)    Ketones, ur 40 (*)    Protein, ur 100 (*)    Nitrite POSITIVE (*)    Leukocytes, UA MODERATE (*)    All other components within normal limits  URINE MICROSCOPIC-ADD ON - Abnormal; Notable for the following:    Bacteria, UA MANY (*)    Casts GRANULAR CAST (*)    All other components within normal limits  URINE CULTURE  LIPASE, BLOOD  ETHANOL  URINE RAPID DRUG SCREEN (HOSP  PERFORMED)  POCT PREGNANCY, URINE   Ct Abdomen Pelvis W Contrast  03/28/2013  *RADIOLOGY REPORT*  Clinical Data: Left lower quadrant pain, nausea and vomiting. Elevated white blood cell count at 25,000.  History of appendectomy.  CT ABDOMEN AND PELVIS WITH CONTRAST  Technique:  Multidetector CT imaging of the abdomen and pelvis was performed following the standard protocol during bolus administration of intravenous contrast.  Contrast: 80mL OMNIPAQUE IOHEXOL 300 MG/ML  SOLN  Comparison: None.  Findings: The lung bases are clear.  No pleural or pericardial effusion.  The left kidney appears markedly swollen with extensive stranding about it.  The left renal pelvis and proximal ureter demonstrate post contrast enhancement.  There is a tiny low attenuating focus in the lower pole of the left kidney measuring 0.7 cm in diameter. No renal or ureteral stones are seen on the right or the left and there is no hydronephrosis.  The colon, stomach and small bowel all appear normal.  The spleen, gallbladder, liver, adrenal glands and pancreas appear normal. There are some small retroperitoneal lymph nodes on the left such as a periaortic node measuring 1 cm short axis dimension on image 40.  No fluid collection is identified.  Uterus and adnexa appear normal.  No focal bony abnormality.  IMPRESSION:  1.  Abnormal appearance of the left kidney and proximal most left ureter most consistent with pyelonephritis.  Tiny low attenuating lesion in the left kidney cannot be definitively characterized but likely represents a cyst rather than an abscess. Negative for hydronephrosis or urinary tract stone.  2.  Small retroperitoneal lymph nodes on the left are likely reactive.   Original Report Authenticated By: Holley Dexter, M.D.    Patient declined to have pelvic done in ED stated she would rather wait until tomorrow as promised by hospitalist.   1. Pyelonephritis       MDM  The patient appears reasonably stabilized for  admission considering the current resources, flow, and capabilities available in the ED at this time, and I doubt any other [facility] requiring further screening and/or treatment in the ED prior to admission. Patient d/w with Dr. Ignacia Palma, agrees with plan.           Jeannetta Ellis, PA-C 03/28/13 2105

## 2013-03-28 NOTE — ED Notes (Signed)
Pt c/o left lower abd pain that started a couple days ago. Pt c/o nausea and vomiting X 3 days ago. Pt c/o urinary frequency, denies burning sensation. Pt in nad, skin warm and dry, resp e/u.

## 2013-03-28 NOTE — Progress Notes (Signed)
27 yo woman with 4 day hx of left flank and LLQ pain, has wbc elevated >25,000, UA with WBC TNTC, and CT of abdomen/pelvis showing L pyelonephritis.  Plan to admit for IV antibiotics.

## 2013-03-28 NOTE — ED Notes (Signed)
Pt returned from radiology.

## 2013-03-28 NOTE — ED Notes (Signed)
Pt refused pelvic exam.

## 2013-03-28 NOTE — Progress Notes (Signed)
[name] [id number] Admission Data: 03/28/2013 6:53 PM Attending Provider: Jonah Blue, DO  [id number], Provider, MD Consults/ Treatment Team:    [name] is a 27 y.o. female patient admitted from ED awake, alert  & orientated  X 3,  Prior, VSS - Blood pressure 146/79, pulse 142, temperature 102 F (38.9 C), temperature source Oral, resp. rate 20, height 5\' 8"  (1.727 m), weight 79.606 kg (175 lb 8 oz), last menstrual period 02/02/2013, SpO2 97.00%., no c/o shortness of breath, no c/o chest pain, no distress noted. .   IV site WDL:  forearm left, condition patent and no redness with a transparent dsg that's clean dry and intact.  Allergies:   Allergies  Allergen Reactions  . Penicillins Cross Reactors Hives     Past Medical History  Diagnosis Date  . PTSD (post-traumatic stress disorder)   . Depression   . Anxiety   . Ankle pain, chronic     History:  obtained from the patient. Tobacco/alcohol: Smoked 1 packs per day for 11 years none  Pt orientation to unit, room and routine. Information packet given to patient/family and safety video watched.  Admission INP armband ID verified with patient/family, and in place. SR up x 2, fall risk assessment complete with Patient and family verbalizing understanding of risks associated with falls. Pt verbalizes an understanding of how to use the call bell and to call for help before getting out of bed.  Skin, clean-dry- intact without evidence of bruising, or skin tears.   No evidence of skin break down noted on exam. no rashes, no ecchymoses, no petechiae, no nodules    Will cont to monitor and assist as needed.  [name] Consuella Lose, RN 03/28/2013 6:53 PM

## 2013-03-28 NOTE — ED Notes (Signed)
Pt finished drinking 1st cup of oral contrast, CT notified. 

## 2013-03-28 NOTE — ED Notes (Signed)
Pt is here with LLQ abdominal pain and vomiting.  No diarrhea or constipation.  Pt reports vaginal discharge that is white.  LMP;  unknown

## 2013-03-28 NOTE — H&P (Signed)
Hospital Admission Note Date: 03/28/2013  Patient name: [name] Medical record number: [id number] Date of birth: 23-Feb-1986 Age: 27 y.o. Gender: female PCP: Default, Provider, MD   Service:  Internal Medicine Teaching Service  Attending Physician:  Dr. Kem Kays   First Contact:  Dr. Earlene Plater   Pager:  [phone] Second Contact:  Dr. Manson Passey  Pager: [phone]     After 5PM, weekends, and holidays: First Contact:              Pager: [phone] Second Contact:         Pager: [phone]    Chief Complaint:  Nausea, flank pain    History of Present Illness:  27 year old woman with no past medical history presenting with 4 days of nausea and left-sided flank pain. Onset was 4 days ago. No provoking incident identified by the patient. Pain in the left side of her abdomen wrapping around the left flank. Rated as severe. Pain has been constant and worsening since onset. Associated with a feeling of urgency immediately after micturition but patient denies dysuria per se. No diarrhea or constipation. No hematuria or hematochezia. On review of systems, patient does report white vaginal discharge with itching. Patient thinks this is BV.  She has had 4 sexual partners over the last couple of years and uses condoms inconsistently.    Review of Systems:   Constitutional: Positive for fever, chills and malaise/fatigue.  HENT: Negative for congestion.   Eyes: Negative for blurred vision.  Respiratory: Negative for cough, sputum production and shortness of breath.   Cardiovascular: Negative for chest pain.  Gastrointestinal: Positive for nausea, vomiting and abdominal pain. Negative for diarrhea, constipation and blood in stool.  Genitourinary: Positive for urgency and flank pain. Negative for dysuria and hematuria.  Skin: Negative for rash.  Neurological: Positive for dizziness. Negative for headaches.     Medical History: Past Medical History  Diagnosis Date  . PTSD (post-traumatic stress disorder)   .  Depression   . Anxiety   . Ankle pain, chronic     Surgical History: Past Surgical History  Procedure Laterality Date  . Appendectomy    . Tubal ligation      2009  . Ankle surgery      Home Medications: No current outpatient prescriptions on file.   Allergies: Allergies as of 03/28/2013 - Review Complete 03/28/2013  Allergen Reaction Noted  . Penicillins cross reactors Hives 12/15/2011    Family History: No family history on file.   Social History: History   Social History  . Marital Status: Single    Spouse Name: N/A    Number of Children: N/A  . Years of Education: N/A   Occupational History  . Not on file.   Social History Main Topics  . Smoking status: Current Every Day Smoker -- 0.50 packs/day    Types: Cigarettes  . Smokeless tobacco: Not on file  . Alcohol Use: No     Comment: occasionally  . Drug Use: Yes    Special: Cocaine     Comment: heroin  . Sexually Active: Yes    Birth Control/ Protection: Surgical   Other Topics Concern  . Not on file   Social History Narrative  . No narrative on file     Physical exam:  VITALS:  BP 104/80, HR 117, RR 20, temp 99.23F, SpO2 97% on room air GENERAL: well developed, well nourished; no acute distress HEAD: atraumatic, normocephalic EYES: pupils equal, round and reactive; sclera anicteric; normal conjunctiva NOSE/THROAT:  oropharynx clear, moist mucous membranes, pink gums, normal dentition NECK: supple, thyroid normal in size and without palpable nodules LYMPH: no cervical or supraclavicular lymphadenopathy LUNGS: clear to auscultation bilaterally, normal work of breathing HEART: normal rate and regular rhythm; normal S1 and S2 without S3 or S4; no murmurs, rubs, or clicks PULSES: radial 2+ and symmetric ABDOMEN: soft, voluntary guarding, no involuntary guarding, generalized tenderness with palpation (worse in the left lower quadrant), no rebound tenderness SKIN: warm, dry, intact, normal turgor, no  rashes EXTREMITIES: no peripheral edema, clubbing, or cyanosis      Lab results: CMP  Component Value Date/Time   NA 133* 03/28/2013 1007   K 4.4 03/28/2013 1007   CL 97 03/28/2013 1007   CO2 26 03/28/2013 1007   GLUCOSE 126* 03/28/2013 1007   BUN 13 03/28/2013 1007   CREATININE 0.90 03/28/2013 1007   CALCIUM 9.6 03/28/2013 1007   PROT 8.9* 03/28/2013 1007   ALBUMIN 3.2* 03/28/2013 1007   AST 16 03/28/2013 1007   ALT 10 03/28/2013 1007   ALKPHOS 53 03/28/2013 1007   BILITOT 0.7 03/28/2013 1007   GFRNONAA 87* 03/28/2013 1007   GFRAA >90 03/28/2013 1007    Lipase   03/28/13 1007  LIPASE 11    CBC Component Value Date/Time   WBC 25.0* 03/28/2013 1007   RBC 4.40 03/28/2013 1007   HGB 12.5 03/28/2013 1007   HCT 35.5* 03/28/2013 1007   PLT 411* 03/28/2013 1007   MCV 80.7 03/28/2013 1007   MCH 28.4 03/28/2013 1007   MCHC 35.2 03/28/2013 1007   RDW 13.0 03/28/2013 1007   LYMPHSABS 3.0 03/28/2013 1007   MONOABS 1.5* 03/28/2013 1007   EOSABS 0.0 03/28/2013 1007   BASOSABS 0.0 03/28/2013 1007    Urine Drug Screen: Component Value Date/Time   OPIATES POSITIVE* 03/28/2013 0952   COCAINE POSITIVE* 03/28/2013 0952   BENZO NONE DETECTED 03/28/2013 0952   AMPHETMU NONE DETECTED 03/28/2013 0952   THCU NONE DETECTED 03/28/2013 0952   BARBITURATE NONE DETECTED 03/28/2013 0952    Alcohol Level:  03/28/13 1410  ETH <11    Urinalysis:  03/28/13 0952  COLORURINE YELLOW  LABSPEC 1.017  PHURINE 5.5  GLUCOSEU NEGATIVE  HGBUR MODERATE*  BILIRUBINUR NEGATIVE  KETONESUR 40*  PROTEINUR 100*  UROBILINOGEN 0.2  NITRITE POSITIVE*  LEUKOCYTESUR MODERATE*  WBC TOO NUM*  RBC 3-6  SQUAM CELLS RARE  BACTERIA MANY*  CASTS GRANULAR*    Misc. Labs: UPT  NEGATIVE   Imaging results: Ct Abdomen Pelvis W Contrast 03/28/2013 FINDINGS: The lung bases are clear.  No pleural or pericardial effusion.  The left kidney appears markedly swollen with extensive stranding about it.  The left renal pelvis and  proximal ureter demonstrate post contrast enhancement.  There is a tiny low attenuating focus in the lower pole of the left kidney measuring 0.7 cm in diameter. No renal or ureteral stones are seen on the right or the left and there is no hydronephrosis.  The colon, stomach and small bowel all appear normal.  The spleen, gallbladder, liver, adrenal glands and pancreas appear normal. There are some small retroperitoneal lymph nodes on the left such as a periaortic node measuring 1 cm short axis dimension on image 40.  No fluid collection is identified.  Uterus and adnexa appear normal.  No focal bony abnormality.   IMPRESSION:  1.  Abnormal appearance of the left kidney and proximal most left ureter most consistent with pyelonephritis.  Tiny low attenuating lesion in the left  kidney cannot be definitively characterized but likely represents a cyst rather than an abscess. Negative for hydronephrosis or urinary tract stone.  2.  Small retroperitoneal lymph nodes on the left are likely reactive.    Assessment and Plan:  1.   Acute pyelonephritis:  Leukocytosis, flank pain, and urinalysis consistent with pyelonephritis. Patient is allergic to penicillin, so we will treat with IV ciprofloxacin.pelvic exam is underway by the emergency department PA. Wet prep, GC, and Chlamydia swabs will be taken. I do not suspect pelvic inflammatory disease, but the pelvic exam can effectively rule this out. - Urine culture pending - IV ciprofloxacin - Pelvic exam by ED PA - Wet prep and GC/chlamydia DNA probe - Morphine 2 mg every 4 hours when necessary for pain - Ondansetron 4 mg every 6 hours when necessary for nausea  2.   At risk sexual behavior:  Multiple partners with inconsistent condom use.  According to the records, patient also has a history of heroine use; serum drug panel positive for opiates and cocaine.  Screening for HIV, syphilis, gonorrhea, Chlamydia, hepatitis B, and [name]. - Hepatitis panel - HIV  antibody - RPR - GC/Chlamydia DNA probe  3.   Polysubstance abuse:  Patient was admitted to behavioral health in July 2013 for polysubstance abuse and substance induced mood disorder. Patient was abusing heroine and cocaine at that time. Serum drug screen here is positive for opiates and cocaine.  4.   Hyponatremia:  Sodium 133 at admission. Most likely from increased insensible losses with infection and decreased oral intake. We are hydrating her with IV normal saline. We will recheck a chemistry panel in the morning. - AM BMP  5.   Paraproteinemia:  Albumin is 3.2, total protein is 8.9, the paraprotein gap is 5.7. Consistent with acute infection and immunoglobulin response. Recheck protein levels as an outpatient after this acute infection is resolved.  6.   Prophylaxis:  Enoxaparin 40 mg subcutaneous daily  7.   Disposition:  No PCP.  Expected length of stay less than 2 nights.    Signed by:  Dorthula Rue. Earlene Plater, MD PGY-I, Internal Medicine  03/28/2013, 4:26 PM

## 2013-03-29 LAB — BASIC METABOLIC PANEL
BUN: 9 mg/dL (ref 6–23)
CO2: 23 mEq/L (ref 19–32)
Chloride: 102 mEq/L (ref 96–112)
Creatinine, Ser: 0.81 mg/dL (ref 0.50–1.10)
Potassium: 3.7 mEq/L (ref 3.5–5.1)

## 2013-03-29 LAB — CBC
HCT: 32.6 % — ABNORMAL LOW (ref 36.0–46.0)
Hemoglobin: 11.6 g/dL — ABNORMAL LOW (ref 12.0–15.0)
MCHC: 35.6 g/dL (ref 30.0–36.0)
MCV: 81.1 fL (ref 78.0–100.0)
RDW: 13 % (ref 11.5–15.5)

## 2013-03-29 LAB — WET PREP, GENITAL: Trich, Wet Prep: NONE SEEN

## 2013-03-29 MED ORDER — ONDANSETRON 8 MG/NS 50 ML IVPB
8.0000 mg | Freq: Four times a day (QID) | INTRAVENOUS | Status: DC | PRN
  Filled 2013-03-29: qty 8

## 2013-03-29 MED ORDER — DIPHENHYDRAMINE HCL 25 MG PO CAPS
25.0000 mg | ORAL_CAPSULE | Freq: Four times a day (QID) | ORAL | Status: DC | PRN
  Administered 2013-03-29: 25 mg via ORAL
  Filled 2013-03-29: qty 1

## 2013-03-29 MED ORDER — MORPHINE SULFATE 4 MG/ML IJ SOLN
4.0000 mg | INTRAMUSCULAR | Status: DC | PRN
  Administered 2013-03-29 – 2013-03-30 (×6): 4 mg via INTRAVENOUS
  Filled 2013-03-29 (×6): qty 1

## 2013-03-29 MED ORDER — ONDANSETRON HCL 4 MG PO TABS
8.0000 mg | ORAL_TABLET | Freq: Four times a day (QID) | ORAL | Status: DC | PRN
  Administered 2013-03-29 – 2013-03-30 (×4): 8 mg via ORAL
  Filled 2013-03-29 (×5): qty 2

## 2013-03-29 MED ORDER — METRONIDAZOLE 500 MG PO TABS
500.0000 mg | ORAL_TABLET | Freq: Two times a day (BID) | ORAL | Status: DC
  Administered 2013-03-29 – 2013-03-30 (×3): 500 mg via ORAL
  Filled 2013-03-29 (×4): qty 1

## 2013-03-29 NOTE — Progress Notes (Signed)
Utilization review completed. Mila Pair, RN, BSN. 

## 2013-03-29 NOTE — Progress Notes (Addendum)
Subjective:    Interval Events:  Feels no better.  Continues to have LLQ pain and nausea.  Fevers/chills have improved though.    Objective:    Vital Signs:   Filed Vitals:   03/29/13 0511 03/29/13 0514 03/29/13 0516 03/29/13 0606  BP: 131/81 156/92 141/85 143/82  Pulse: 88 94 115 98  Temp: 99.5 F (37.5 C)   99.9 F (37.7 C)  TempSrc: Oral   Oral  Resp: 18 20 20 18   Height:      Weight:      SpO2: 99% 98% 98% 97%    Weights: [employer]   03/28/13 1842  Weight: 175 lb 8 oz (79.606 kg)    Intake/Output:   Gross per 24 hour  Intake 2392.5 ml  Output      0 ml  Net 2392.5 ml     Last BM Date: 03/28/13   Physical Exam: GENERAL: alert and oriented; resting comfortably in bed and in no distress LUNGS: clear to auscultation bilaterally, normal work of breathing HEART: normal rate; regular rhythm ABDOMEN: tender with palpation of the LLQ, + CVA tenderness, normal bowel sounds    Labs: Basic Metabolic Panel: Lab 03/28/13 1007 03/29/13 0500  NA 133* 136  K 4.4 3.7  CL 97 102  CO2 26 23  GLUCOSE 126* 92  BUN 13 9  CREATININE 0.90 0.81  CALCIUM 9.6 8.7    CBC: Lab 03/28/13 1007 03/29/13 0500  WBC 25.0* 20.2*  NEUTROABS 20.5*  --   HGB 12.5 11.6*  HCT 35.5* 32.6*  MCV 80.7 81.1  PLT 411* 416*    STD Testing Component Value   HIV NON REACTIVE   RPR NON REACTIVE   GC NEEDS COLLECTING   CHLAMYDIA NEEDS COLLECTING   HAV IgM PENDING   HBV SAg NEGATIVE   HBV CIgM PENDING   HCV Ab REACTIVE*       Medications:    Infusions: . sodium chloride 150 mL/hr at 03/29/13 0751     Scheduled Medications: . ciprofloxacin  400 mg Intravenous Q12H  . enoxaparin (LOVENOX) injection  40 mg Subcutaneous Q24H     PRN Medications: acetaminophen, acetaminophen, morphine injection, ondansetron (ZOFRAN) IV, ondansetron    Assessment/ Plan:    1.   Acute pyelonephritis:  Leukocytosis, flank pain, and urinalysis consistent with pyelonephritis.  Patient is allergic to penicillin, so we will treat with IV ciprofloxacin. I do not suspect pelvic inflammatory disease, but a pelvic exam today can effectively rule this out.  - Urine culture pending  - Blood cultures drawn - IV ciprofloxacin  - Pelvic exam today - Wet prep and GC/chlamydia DNA probe  - Morphine 4 mg every 4 hours when necessary for pain  - Ondansetron 8 mg every 6 hours when necessary for nausea   2.   At risk sexual behavior:  Multiple partners with inconsistent condom use. According to the records, patient also has a history of heroine use; serum drug panel positive for opiates and cocaine.  HIV and RPR non reactive.  HCV antibody positive; remainder of hepatitis panel pending.  GC and chlamydia screening as above. - Hepatitis panel pending - GC/Chlamydia DNA probe   3.   [name]:  Positive antibody.  Confirming with quantitative testing.  Will test for HAV and HBV immunity. - HCV DNA quant - HBV surface Ab - HAV total Ab  4.   Polysubstance abuse:  Patient was admitted to behavioral health in July 2013 for polysubstance abuse  and substance induced mood disorder. Patient was abusing heroine and cocaine at that time. Serum drug screen here is positive for opiates and cocaine. - Social work consult  5.   Hyponatremia:  Sodium 133 at admission. Most likely from increased insensible losses with infection and decreased oral intake. Now resolved with IV hydration.  6.   Paraproteinemia:  Albumin is 3.2, total protein is 8.9, the paraprotein gap is 5.7. Consistent with acute infection and immunoglobulin response. Recheck protein levels as an outpatient after this acute infection is resolved.  7.   Prophylaxis:  Enoxaparin 40 mg subcutaneous daily   8.   Disposition:  No PCP. Expected length of stay less than 2 nights.    Length of Stay: 1 days   Signed by:  Dorthula Rue. Earlene Plater, MD PGY-I, Internal Medicine Pager [phone]  03/29/2013, 11:29 AM     Addendum:      Pelvic exam performed.  Normal appearing external genitalia.  Normal appearing vaginal and cervical mucosa.  Scant, thin, white discharge in vaginal vault and in cervical os.  Swabs taken for GC/Chlamydia DNA amp testing and for wet-mount.    Signed by:  Dorthula Rue. Earlene Plater, MD PGY-I, Internal Medicine Pager [phone]  03/29/2013, 1:33 PM

## 2013-03-29 NOTE — Progress Notes (Signed)
Brief Nutrition Note:   RD pulled to pt for malnutrition screening tool report of poor oral intake and weight loss.  Pt states decreased intake for several days, 5 lb weight loss. Likely fluid related. Expect good return of appetite with pain control.   Wt Readings from Last 5 Encounters:  03/28/13 175 lb 8 oz (79.606 kg)  06/22/12 178 lb (80.74 kg)  04/27/12 188 lb 1.6 oz (85.322 kg)   Body mass index is 26.69 kg/(m^2). overweight.   Currently on a regular diet.   Chart reviewed, no nutrition interventions warranted at this time. Please consult as needed.   Clarene Duke RD, LDN Pager [phone] After Hours pager [phone]

## 2013-03-29 NOTE — ED Provider Notes (Signed)
Medical screening examination/treatment/procedure(s) were conducted as a shared visit with non-physician practitioner(s) and myself.  I personally evaluated the patient during the encounter 27 yo woman with 4 day hx of left flank and LLQ pain, has wbc elevated >25,000, UA with WBC TNTC, and CT of abdomen/pelvis showing L pyelonephritis. Plan to admit for IV antibiotics.       Carleene Cooper III, MD 03/29/13 1100

## 2013-03-29 NOTE — H&P (Signed)
INTERNAL MEDICINE TEACHING SERVICE Attending Admission Note  Date: 03/29/2013  Patient name: [name]  Medical record number: [id number]  Date of birth: [birth year]    I have seen and evaluated [name] and discussed their care with the Residency Team.  26 yr. Old AAF w/ reported hx of PTSD ([place] war veteran) presented w/ N/V and abdominal pain. The patient states she started with abdominal pain over the past few days, mostly over her LLQ and radiating to her left lower back. She admits to urinary urgency and vaginal discharge.  She admits to using condoms inconsistently and multiple sexual partners. She reports fever, chills, malaise as well. She admits to use of heroin (IV) and cocaine. She was noted to be tachy to 117 bpm on admission, febrile to 101 F, and w/ a WBC of 25k.  She meets sepsis criteria. This morning she feels better, but admits to LLQ abdominal pain.  A CT of Abd/pelvis shows evidence of L kidney pyelonephritis, a small lesion likely representing a cyst, and a small retroperitoneal lymph node.  Physical Exam: Blood pressure 143/82, pulse 98, temperature 99.9 F (37.7 C), temperature source Oral, resp. rate 18, height 5\' 8"  (1.727 m), weight 175 lb 8 oz (79.606 kg), last menstrual period 02/02/2013, SpO2 97.00%.  General: Vital signs reviewed and noted. Well-developed, well-nourished, in no acute distress; alert, appropriate and cooperative throughout examination.  Head: Normocephalic, atraumatic.  Eyes: PERRL, EOMI, No signs of anemia or jaundince.  Nose: Mucous membranes moist, not inflammed, nonerythematous.  Throat: Oropharynx nonerythematous, no exudate appreciated.   Neck: No deformities, masses, or tenderness noted.Supple, No carotid Bruits, no JVD.  Lungs:  Normal respiratory effort. Clear to auscultation BL without crackles or wheezes.  Heart: RRR. S1 and S2 normal without gallop, murmur, or rubs.  Abdomen:  +LLQ tenderness, no guarding. + L CVA tenderness. +BS.  No distention.  Extremities: No pretibial edema.  Neurologic: A&O X3, CN II - XII are grossly intact.   Skin: She has track marks on her LUE/RUE, none of which appear infected.    Lab results: Results for orders placed during the hospital encounter of 03/28/13 (from the past 24 hour(s))  POCT PREGNANCY, URINE     Status: None   Collection Time    03/28/13 11:06 AM      Result Value Range   Preg Test, Ur NEGATIVE  NEGATIVE  ETHANOL     Status: None   Collection Time    03/28/13  2:10 PM      Result Value Range   Alcohol, Ethyl (B) <11  0 - 11 mg/dL  RPR     Status: None   Collection Time    03/28/13  4:34 PM      Result Value Range   RPR NON REACTIVE  NON REACTIVE  HIV ANTIBODY (ROUTINE TESTING)     Status: None   Collection Time    03/28/13  4:34 PM      Result Value Range   HIV NON REACTIVE  NON REACTIVE  BASIC METABOLIC PANEL     Status: None   Collection Time    03/29/13  5:00 AM      Result Value Range   Sodium 136  135 - 145 mEq/L   Potassium 3.7  3.5 - 5.1 mEq/L   Chloride 102  96 - 112 mEq/L   CO2 23  19 - 32 mEq/L   Glucose, Bld 92  70 - 99 mg/dL   BUN  9  6 - 23 mg/dL   Creatinine, Ser 4.09  0.50 - 1.10 mg/dL   Calcium 8.7  8.4 - 81.1 mg/dL   GFR calc non Af Amer >90  >90 mL/min   GFR calc Af Amer >90  >90 mL/min  CBC     Status: Abnormal   Collection Time    03/29/13  5:00 AM      Result Value Range   WBC 20.2 (*) 4.0 - 10.5 K/uL   RBC 4.02  3.87 - 5.11 MIL/uL   Hemoglobin 11.6 (*) 12.0 - 15.0 g/dL   HCT 91.4 (*) 78.2 - 95.6 %   MCV 81.1  78.0 - 100.0 fL   MCH 28.9  26.0 - 34.0 pg   MCHC 35.6  30.0 - 36.0 g/dL   RDW 21.3  08.6 - 57.8 %   Platelets 416 (*) 150 - 400 K/uL    Imaging results:  Ct Abdomen Pelvis W Contrast  03/28/2013  *RADIOLOGY REPORT*  Clinical Data: Left lower quadrant pain, nausea and vomiting. Elevated white blood cell count at 25,000.  History of appendectomy.  CT ABDOMEN AND PELVIS WITH CONTRAST  Technique:  Multidetector CT  imaging of the abdomen and pelvis was performed following the standard protocol during bolus administration of intravenous contrast.  Contrast: 80mL OMNIPAQUE IOHEXOL 300 MG/ML  SOLN  Comparison: None.  Findings: The lung bases are clear.  No pleural or pericardial effusion.  The left kidney appears markedly swollen with extensive stranding about it.  The left renal pelvis and proximal ureter demonstrate post contrast enhancement.  There is a tiny low attenuating focus in the lower pole of the left kidney measuring 0.7 cm in diameter. No renal or ureteral stones are seen on the right or the left and there is no hydronephrosis.  The colon, stomach and small bowel all appear normal.  The spleen, gallbladder, liver, adrenal glands and pancreas appear normal. There are some small retroperitoneal lymph nodes on the left such as a periaortic node measuring 1 cm short axis dimension on image 40.  No fluid collection is identified.  Uterus and adnexa appear normal.  No focal bony abnormality.  IMPRESSION:  1.  Abnormal appearance of the left kidney and proximal most left ureter most consistent with pyelonephritis.  Tiny low attenuating lesion in the left kidney cannot be definitively characterized but likely represents a cyst rather than an abscess. Negative for hydronephrosis or urinary tract stone.  2.  Small retroperitoneal lymph nodes on the left are likely reactive.   Original Report Authenticated By: Holley Dexter, M.D.      Assessment and Plan: I agree with the formulated Assessment and Plan with the following changes: 26 yr. Old AAF w/ reported hx of PTSD ([place] war veteran) presented w/ N/V and abdominal pain, with acute pyelonephritis and sepsis. -Agree with Cipro 400 mg IV q12hrs. -Obtain BC, UC today and repeat BC tomorrow to make sure we rule out bacteremia and if there is bacteremia, that it is not persistent (given her hx of IV drug use). -Needs Pelvic exam when she allows [place]. -Her Hep C Ab is  positive, she will need a vital load and outpatient follow up. GC/Chlamydia needs to be sent. -She is requesting access to resources for her drug use, interested in [place] follow up since she is a veteran, as well as follow up for what she believes is PTSD.  The treatment plan was discussed in detail with the patient.  Alternatives to treatment,  side effects, risks and benefits, and complications were discussed with the patient. Informed consent was obtained. The patient agrees to proceed with the current treatment plan.  Jonah Blue, DO 4/29/201410:50 AM

## 2013-03-30 DIAGNOSIS — F329 Major depressive disorder, single episode, unspecified: Secondary | ICD-10-CM

## 2013-03-30 DIAGNOSIS — F1910 Other psychoactive substance abuse, uncomplicated: Secondary | ICD-10-CM

## 2013-03-30 DIAGNOSIS — B9689 Other specified bacterial agents as the cause of diseases classified elsewhere: Secondary | ICD-10-CM | POA: Diagnosis present

## 2013-03-30 DIAGNOSIS — N760 Acute vaginitis: Secondary | ICD-10-CM | POA: Diagnosis present

## 2013-03-30 DIAGNOSIS — F1994 Other psychoactive substance use, unspecified with psychoactive substance-induced mood disorder: Secondary | ICD-10-CM

## 2013-03-30 DIAGNOSIS — B182 Chronic viral hepatitis C: Secondary | ICD-10-CM

## 2013-03-30 DIAGNOSIS — D892 Hypergammaglobulinemia, unspecified: Secondary | ICD-10-CM

## 2013-03-30 DIAGNOSIS — F17200 Nicotine dependence, unspecified, uncomplicated: Secondary | ICD-10-CM

## 2013-03-30 DIAGNOSIS — F1120 Opioid dependence, uncomplicated: Secondary | ICD-10-CM

## 2013-03-30 LAB — URINE CULTURE: Colony Count: >=100000

## 2013-03-30 LAB — HEPATITIS A ANTIBODY, TOTAL: Hep A Total Ab: POSITIVE — AB

## 2013-03-30 LAB — HEPATITIS B SURFACE ANTIBODY,QUALITATIVE: Hep B S Ab: REACTIVE — AB

## 2013-03-30 MED ORDER — CIPROFLOXACIN HCL 500 MG PO TABS: 500.0000 mg | ORAL_TABLET | Freq: Two times a day (BID) | ORAL | Status: AC

## 2013-03-30 MED ORDER — METRONIDAZOLE 500 MG PO TABS: 500.0000 mg | ORAL_TABLET | Freq: Two times a day (BID) | ORAL | Status: AC

## 2013-03-30 NOTE — Care Management Note (Signed)
    Page 1 of 1   03/30/2013     2:33:51 PM   CARE MANAGEMENT NOTE 03/30/2013  Patient:  KAMIYAH, KINDEL   Account Number:  [id number]  Date Initiated:  03/30/2013  Documentation initiated by:  Letha Cape  Subjective/Objective Assessment:   dx pyelonephritis  admit- homeless     Action/Plan:   Anticipated DC Date:  03/30/2013   Anticipated DC Plan:  HOME/SELF CARE  In-house referral  Clinical Social Worker      DC Associate Professor  CM consult  [facility]      Choice offered to / List presented to:             Status of service:  Completed, signed off Medicare Important Message given?   (If response is "NO", the following Medicare IM given date fields will be blank) Date Medicare IM given:   Date Additional Medicare IM given:    Discharge Disposition:  HOME/SELF CARE  Per UR Regulation:  Reviewed for med. necessity/level of care/duration of stay  If discussed at Long Length of Stay Meetings, dates discussed:    Comments:  03/30/13 14:17 Letha Cape RN,BSN [phone] patient is homeless, pta indep.  Patient states her mother will be transporting her to her home.  I assisted patient with medications thru [facility] with [facility] on [address].   I asked patient where will she be staying she states she will go to her mother's house, she could not wait for CSW to come and talk to her because her ride was here.

## 2013-03-30 NOTE — Progress Notes (Signed)
Nsg Discharge Note  Admit Date:  03/28/2013 Discharge date: 03/30/2013   [name] to be D/C'd Home per MD order.  AVS completed.  Copy for chart, and copy for patient signed, and dated. Patient/caregiver able to verbalize understanding.  Discharge Medication:   Medication List    TAKE these medications       ciprofloxacin 500 MG tablet  Commonly known as:  CIPRO  Take 1 tablet (500 mg total) by mouth 2 (two) times daily. First dose is tonight (4/30). Last dose evening on 5/5.     metroNIDAZOLE 500 MG tablet  Commonly known as:  FLAGYL  Take 1 tablet (500 mg total) by mouth 2 (two) times daily. First dose is tonight (4/30). Last dose evening on 5/5.        Discharge Assessment: Filed Vitals:   03/30/13 0543  BP: 110/71  Pulse: 90  Temp: 99 F (37.2 C)  Resp: 18   Skin clean, dry and intact without evidence of skin break down, no evidence of skin tears noted. IV catheter discontinued intact. Site without signs and symptoms of complications - no redness or edema noted at insertion site, patient denies c/o pain - only slight tenderness at site.  Dressing with slight pressure applied.  D/c Instructions-Education: Discharge instructions given to patient/family with verbalized understanding. D/c education completed with patient/family including follow up instructions, medication list, d/c activities limitations if indicated, with other d/c instructions as indicated by MD - patient able to verbalize understanding, all questions fully answered. Patient instructed to return to ED, call 911, or call MD for any changes in condition.  Patient escorted via WC, and D/C home via private auto.  Abriella Filkins Consuella Lose, RN 03/30/2013 2:10 PM

## 2013-03-30 NOTE — Discharge Summary (Signed)
Patient Name:  [name] MRN: [id number]  PCP: Provider Default, MD DOB:  [birth year]       Date of Admission:  03/28/2013  Date of Discharge:  03/30/2013      Attending Physician: Jonah Blue, DO         DISCHARGE DIAGNOSES: 1. Escherichia coli pyelonephritis 2. Bacterial vaginosis 3. Chronic [name] 4. Polysubstance abuse 5. Hyponatremia, resolved 6. Paraproteinemia    DISPOSITION AND FOLLOW-UP: [name] is to follow-up with the listed providers as detailed below, at which time, the following should be addressed:  1. Follow-up visits: 1. Patient is following up with [place] just once.  She needs to establish with the [place] center in [place], [place].  2. Labs and images needed: 1. LFTs:  Monitor for resolution of paraproteinemia  3. Pending labs and tests needing follow-up: 1. BLOOD CULTURES   2. URINE CULTURE SENSITIVITIES 3. HAV TOTAL ANTIBODY:  If not immune, pt will need HAV vaccinations.  Consider giving first round at follow up with 2nd dose (6 months later) by [place]. 4. HCV RNA QUANT    Follow-up Information   Follow up with Anne Ar, MD On 04/08/2013. (INTERNAL MEDICINE - Your appointment is at 3:30 on Friday, May 9th.)    Contact information:   [address] [place] [address] [phone]      Discharge Orders   Future Appointments Provider Department Dept Phone   04/08/2013 3:30 PM Larey Seat, MD  [facility] [phone]   Future Orders Complete By Expires     Call MD for:  persistant nausea and vomiting  As directed     Call MD for:  severe uncontrolled pain  As directed     Call MD for:  temperature >100.4  As directed     Diet general  As directed     Increase activity slowly  As directed         DISCHARGE MEDICATIONS:   Medication List    TAKE these medications       ciprofloxacin 500 MG tablet  Commonly known as:  CIPRO  Take 1 tablet (500 mg total) by mouth 2 (two) times daily. First  dose is tonight (4/30). Last dose evening on 5/5.     metroNIDAZOLE 500 MG tablet  Commonly known as:  FLAGYL  Take 1 tablet (500 mg total) by mouth 2 (two) times daily. First dose is tonight (4/30). Last dose evening on 5/5.         CONSULTS:  NONE   PROCEDURES PERFORMED:  Ct Abdomen Pelvis W Contrast 03/28/2013  FINDINGS: The lung bases are clear.  No pleural or pericardial effusion.  The left kidney appears markedly swollen with extensive stranding about it.  The left renal pelvis and proximal ureter demonstrate post contrast enhancement.  There is a tiny low attenuating focus in the lower pole of the left kidney measuring 0.7 cm in diameter. No renal or ureteral stones are seen on the right or the left and there is no hydronephrosis.  The colon, stomach and small bowel all appear normal.  The spleen, gallbladder, liver, adrenal glands and pancreas appear normal. There are some small retroperitoneal lymph nodes on the left such as a periaortic node measuring 1 cm short axis dimension on image 40.  No fluid collection is identified.  Uterus and adnexa appear normal.  No focal bony abnormality.   IMPRESSION:  1.  Abnormal appearance of the left  kidney and proximal most left ureter most consistent with pyelonephritis.  Tiny low attenuating lesion in the left kidney cannot be definitively characterized but likely represents a cyst rather than an abscess. Negative for hydronephrosis or urinary tract stone.  2.  Small retroperitoneal lymph nodes on the left are likely reactive.      ADMISSION DATA: H&P: 27 year old woman with no past medical history presenting with 4 days of nausea and left-sided flank pain. Onset was 4 days ago. No provoking incident identified by the patient. Pain in the left side of her abdomen wrapping around the left flank. Rated as severe. Pain has been constant and worsening since onset. Associated with a feeling of urgency immediately after micturition but patient denies  dysuria per se. No diarrhea or constipation. No hematuria or hematochezia. On review of systems, patient does report white vaginal discharge with itching. Patient thinks this is BV. She has had 4 sexual partners over the last couple of years and uses condoms inconsistently.  Physical Exam: VITALS: BP 104/80, HR 117, RR 20, temp 99.29F, SpO2 97% on room air  GENERAL: well developed, well nourished; no acute distress  HEAD: atraumatic, normocephalic  EYES: pupils equal, round and reactive; sclera anicteric; normal conjunctiva  NOSE/THROAT: oropharynx clear, moist mucous membranes, pink gums, normal dentition  NECK: supple, thyroid normal in size and without palpable nodules  LYMPH: no cervical or supraclavicular lymphadenopathy  LUNGS: clear to auscultation bilaterally, normal work of breathing  HEART: normal rate and regular rhythm; normal S1 and S2 without S3 or S4; no murmurs, rubs, or clicks  PULSES: radial 2+ and symmetric  ABDOMEN: soft, voluntary guarding, no involuntary guarding, generalized tenderness with palpation (worse in the left lower quadrant), no rebound tenderness  SKIN: warm, dry, intact, normal turgor, no rashes  EXTREMITIES: no peripheral edema, clubbing, or cyanosis   Labs: CMP  Component  Value  Date/Time    NA  133*  03/28/2013 1007    K  4.4  03/28/2013 1007    CL  97  03/28/2013 1007    CO2  26  03/28/2013 1007    GLUCOSE  126*  03/28/2013 1007    BUN  13  03/28/2013 1007    CREATININE  0.90  03/28/2013 1007    CALCIUM  9.6  03/28/2013 1007    PROT  8.9*  03/28/2013 1007    ALBUMIN  3.2*  03/28/2013 1007    AST  16  03/28/2013 1007    ALT  10  03/28/2013 1007    ALKPHOS  53  03/28/2013 1007    BILITOT  0.7  03/28/2013 1007    GFRNONAA  87*  03/28/2013 1007    GFRAA  >90  03/28/2013 1007    Lipase   03/28/13 1007   LIPASE  11    CBC  Component  Value  Date/Time    WBC  25.0*  03/28/2013 1007    RBC  4.40  03/28/2013 1007    HGB  12.5  03/28/2013 1007    HCT  35.5*   03/28/2013 1007    PLT  411*  03/28/2013 1007    MCV  80.7  03/28/2013 1007    MCH  28.4  03/28/2013 1007    MCHC  35.2  03/28/2013 1007    RDW  13.0  03/28/2013 1007    LYMPHSABS  3.0  03/28/2013 1007    MONOABS  1.5*  03/28/2013 1007    EOSABS  0.0  03/28/2013 1007  BASOSABS  0.0  03/28/2013 1007    Urine Drug Screen:  Component  Value  Date/Time    OPIATES  POSITIVE*  03/28/2013 0952    COCAINE  POSITIVE*  03/28/2013 0952    BENZO  NONE DETECTED  03/28/2013 0952    AMPHETMU  NONE DETECTED  03/28/2013 0952    THCU  NONE DETECTED  03/28/2013 0952    BARBITURATE  NONE DETECTED  03/28/2013 0952    Alcohol Level:   03/28/13 1410   ETH  <11    Urinalysis:   03/28/13 0952   COLORURINE  YELLOW   LABSPEC  1.017   PHURINE  5.5   GLUCOSEU  NEGATIVE   HGBUR  MODERATE*   BILIRUBINUR  NEGATIVE   KETONESUR  40*   PROTEINUR  100*   UROBILINOGEN  0.2   NITRITE  POSITIVE*   LEUKOCYTESUR  MODERATE*   WBC  TOO NUM*   RBC  3-6   SQUAM CELLS  RARE   BACTERIA  MANY*   CASTS  GRANULAR*    Misc. Labs:  UPT NEGATIVE   HOSPITAL COURSE: 1.   Acute pyelonephritis, Escherichia coli:  Leukocytosis, flank pain, and urinalysis consistent with pyelonephritis. Patient is allergic to penicillin, so we treated with ciprofloxacin; she received 4 doses of IV ciprofloxacin. Escherichia coli on urine culture. Sensitivities pending. Home with 11 tablets of ciprofloxacin 500 mg to be taken BID to finish a 7 day course.  Urine culture pending sensitivities.   2.   Bacterial vaginosis:  Patient complaining of thin, white vaginal discharge. Wet prep with clue cells. Oral metronidazole started.  Patient received 3 doses here in the hospital. Home with 11 tablets of metronidazole 500 mg to be taken twice a day to finish a seven-day course.  3.   At risk sexual behavior:  Multiple partners with inconsistent condom use.  HIV and RPR non reactive. HCV antibody positive; negative tests for HBV and HAV infection. GC and  chlamydia testing negative.  4.   [name]:  Positive antibody. Confirming with quantitative testing. She reports immunization for HAV and HBV through the [employer]. HBV surface antibody reactive, indicating immunization for HBV.  HAV total antibody pending.  5.   Polysubstance abuse:  Patient was admitted to behavioral health in July 2013 for polysubstance abuse and substance induced mood disorder. Patient was abusing heroine and cocaine at that time. Serum drug screen here is positive for opiates and cocaine.   6.   Hyponatremia:  Sodium 133 at admission. Most likely from increased insensible losses with infection and decreased oral intake.  Resolved with IV hydration.  7.   Paraproteinemia:  Albumin is 3.2, total protein is 8.9, the paraprotein gap is 5.7. Consistent with acute infection and immunoglobulin response. Recheck protein levels as an outpatient after this acute infection is resolved.   DISCHARGE DATA: Vital Signs: BP 110/71  Pulse 90  Temp(Src) 99 F (37.2 C) (Oral)  Resp 18  Ht 5\' 8"  (1.727 m)  Wt 175 lb 8 oz (79.606 kg)  BMI 26.69 kg/m2  SpO2 99%  LMP 02/02/2013  Labs: Hepatitis Studies  Component  Value    HAV IgM  NEGATIVE    HAV Ab  PENDING    HBV sAg  NEGATIVE    HBV cAb IgM  NEGATIVE    HBV sAb  REACTIVE*    HCV Ab  REACTIVE*    HCV RNA  PENDING    STD Testing  Component  Value  HIV  NON REACTIVE    RPR  NON REACTIVE    GC  NEGATIVE   CHLAMYDIA  NEGATIVE   Microbiology:  URINE CULTURE Status: PRELIMINARY    Collection Time    03/28/13 9:52 AM   Result  Value  Range  Status    Colony Count  >=100,000 COLONIES/ML   Final    Culture  ESCHERICHIA COLI   Final   WET PREP, GENITAL Status: Abnormal    Collection Time    03/29/13 1:37 PM   Result  Value  Range  Status    Yeast Wet Prep HPF POC  NONE SEEN  NONE SEEN  Final    Trich, Wet Prep  NONE SEEN  NONE SEEN  Final    Clue Cells Wet Prep HPF POC  MODERATE (*)  NONE SEEN  Final    WBC, Wet  Prep HPF POC  MODERATE (*)  NONE SEEN  Final   GC/CHLAMYDIA PROBE AMP Status: None    Collection Time    03/29/13 1:37 PM   Result  Value  Range  Status    CT Probe RNA  NEGATIVE  NEGATIVE  Final    GC Probe RNA  NEGATIVE  NEGATIVE  Final    Blood Cultures: CULTURE, BLOOD (ROUTINE X 2)     Status: None   Collection Time    03/29/13 10:10 AM      Result Value Range Status   Specimen Description BLOOD RIGHT HAND   Final   Special Requests BOTTLES DRAWN AEROBIC AND ANAEROBIC 10CC   Final   Culture  Setup Time 03/29/2013 18:23   Final   Culture     Final   Value:        BLOOD CULTURE RECEIVED NO GROWTH TO DATE CULTURE WILL BE HELD FOR 5 DAYS BEFORE ISSUING A FINAL NEGATIVE REPORT   Report Status PENDING   Incomplete  CULTURE, BLOOD (ROUTINE X 2)     Status: None   Collection Time    03/29/13 10:20 AM      Result Value Range Status   Specimen Description BLOOD RIGHT HAND   Final   Special Requests BOTTLES DRAWN AEROBIC AND ANAEROBIC 10CC   Final   Culture  Setup Time 03/29/2013 18:23   Final   Culture     Final   Value:        BLOOD CULTURE RECEIVED NO GROWTH TO DATE CULTURE WILL BE HELD FOR 5 DAYS BEFORE ISSUING A FINAL NEGATIVE REPORT   Report Status PENDING   Incomplete     Time spent on discharge: 33 minutes   Services Ordered on Discharge: 1. PT - no 2. OT - no 3. RN - no 4. Other - no   Signed by:  Dorthula Rue. Earlene Plater, MD PGY-I, Internal Medicine  03/30/2013, 10:42 AM

## 2013-03-30 NOTE — Progress Notes (Signed)
Subjective:    Interval Events:  Patient feels much better this morning.  She continues to have some nausea but did eat a good portion of her dinner last night.  Her abdominal pain is much improved.  No fevers.    Objective:    Vital Signs:   Filed Vitals:   03/29/13 0606 03/29/13 1500 03/29/13 2143 03/30/13 0543  BP: 143/82 124/75 130/79 110/71  Pulse: 98 78 83 90  Temp: 99.9 F (37.7 C) 98.9 F (37.2 C) 99.9 F (37.7 C) 99 F (37.2 C)  TempSrc: Oral Oral Oral Oral  Resp: 18 18 20 18   Height:      Weight:      SpO2: 97% 99% 100% 99%    Weights: [employer]   03/28/13 1842  Weight: 175 lb 8 oz (79.606 kg)    Intake/Output:   Gross per 24 hour  Intake 3504.5 ml  Output    400 ml  Net 3104.5 ml     Last BM Date: 03/29/13   Physical Exam: GENERAL: alert and oriented; resting comfortably in bed and in no distress LUNGS: clear to auscultation bilaterally, normal work of breathing HEART: normal rate; regular rhythm ABDOMEN: tenderness with palpation of the LLQ, normal bowel sounds, no masses  Labs: Hepatitis Studies Component  Value    HAV IgM NEGATIVE   HAV Ab PENDING   HBV sAg NEGATIVE   HBV cAb IgM NEGATIVE   HBV sAb PENDING   HCV Ab REACTIVE*    HCV RNA PENDING     Microbiology:  URINE CULTURE     Status: PRELIMINARY   Collection Time    03/28/13  9:52 AM      Result Value Range Status   Colony Count >=100,000 COLONIES/ML   Final   Culture ESCHERICHIA COLI   Final  WET PREP, GENITAL     Status: Abnormal   Collection Time    03/29/13  1:37 PM      Result Value Range Status   Yeast Wet Prep HPF POC NONE SEEN  NONE SEEN Final   Trich, Wet Prep NONE SEEN  NONE SEEN Final   Clue Cells Wet Prep HPF POC MODERATE (*) NONE SEEN Final   WBC, Wet Prep HPF POC MODERATE (*) NONE SEEN Final  GC/CHLAMYDIA PROBE AMP     Status: None   Collection Time    03/29/13  1:37 PM      Result Value Range Status   CT Probe RNA NEGATIVE  NEGATIVE Final   GC Probe  RNA NEGATIVE  NEGATIVE Final      Medications:    Infusions: . sodium chloride 75 mL/hr at 03/29/13 1404     Scheduled Medications: . ciprofloxacin  400 mg Intravenous Q12H  . enoxaparin (LOVENOX) injection  40 mg Subcutaneous Q24H  . metroNIDAZOLE  500 mg Oral BID     PRN Medications: acetaminophen, acetaminophen, diphenhydrAMINE, morphine injection, ondansetron (ZOFRAN) IV, ondansetron    Assessment/ Plan:    1.   Acute pyelonephritis, E. coli:  Leukocytosis, flank pain, and urinalysis consistent with pyelonephritis. Patient is allergic to penicillin, so we will treat with ciprofloxacin. E. Coli on urine culture. - Urine culture pending sensitivities - Blood cultures pending - IV ciprofloxacin, switch to oral today (2/7) - Morphine 4 mg every 4 hours when necessary for pain  - Ondansetron 8 mg every 6 hours when necessary for nausea   2.   Bacterial vaginosis:  Wet prep with clue cells.  Oral metronidazole started. - Metronidazole 500 mg BID (2/7)  3.   At risk sexual behavior:  Multiple partners with inconsistent condom use. According to the records, patient also has a history of heroine use; serum drug panel positive for opiates and cocaine.  HIV and RPR non reactive.  HCV antibody positive; negative tests for HBV and HAV infection.  GC and chlamydia testing negative.  4.   [name]:  Positive antibody.  Confirming with quantitative testing.  Will test for HAV and HBV immunity. - HCV DNA quant pending - HBV surface Ab pending - HAV total Ab pending  5.   Polysubstance abuse:  Patient was admitted to behavioral health in July 2013 for polysubstance abuse and substance induced mood disorder. Patient was abusing heroine and cocaine at that time. Serum drug screen here is positive for opiates and cocaine. - Social work consult  6.   Hyponatremia:  Sodium 133 at admission. Most likely from increased insensible losses with infection and decreased oral intake. Now resolved  with IV hydration.  7.   Paraproteinemia:  Albumin is 3.2, total protein is 8.9, the paraprotein gap is 5.7. Consistent with acute infection and immunoglobulin response. Recheck protein levels as an outpatient after this acute infection is resolved.  8.   Prophylaxis:  Enoxaparin 40 mg subcutaneous daily   9.   Disposition:  No PCP.  Will follow up with [facility]  Expected length of stay less than 2 nights.    Length of Stay: 2 days   Signed by:  Dorthula Rue. Earlene Plater, MD PGY-I, Internal Medicine Pager [phone]  03/30/2013, 8:23 AM

## 2013-03-30 NOTE — Discharge Summary (Signed)
INTERNAL MEDICINE TEACHING SERVICE Attending Note  Date: 03/30/2013  Patient name: [name]  Medical record number: [id number]  Date of birth: 28-Apr-1986    This patient has been seen and discussed with the house staff. Please see their note for complete details. I concur with their findings and plan. She feels well today. Agree with treatment for E. Coli pyelonephritis and BV. She will need to be established at the [place]. Follow up [facility], follow up post hospitalization in clinic as well at least once while she is establishing in [place].  The treatment plan was discussed in detail with the patient.  Alternatives to treatment, side effects, risks and benefits, and complications were discussed with the patient. Informed consent was obtained. The patient agrees to proceed with the current treatment plan.  Jonah Blue, DO  03/30/2013, 1:41 PM

## 2013-04-04 LAB — CULTURE, BLOOD (ROUTINE X 2): Culture: NO GROWTH

## 2013-04-08 ENCOUNTER — Ambulatory Visit: Payer: Self-pay | Admitting: Internal Medicine

## 2014-06-27 ENCOUNTER — Ambulatory Visit (INDEPENDENT_AMBULATORY_CARE_PROVIDER_SITE_OTHER): Payer: Self-pay | Admitting: Emergency Medicine

## 2014-06-27 VITALS — BP 118/60 | HR 71 | Temp 98.2°F | Resp 12 | Ht 68.0 in | Wt 182.2 lb

## 2014-06-27 DIAGNOSIS — IMO0002 Reserved for concepts with insufficient information to code with codable children: Secondary | ICD-10-CM

## 2014-06-27 DIAGNOSIS — H60339 Swimmer's ear, unspecified ear: Secondary | ICD-10-CM

## 2014-06-27 DIAGNOSIS — H60331 Swimmer's ear, right ear: Secondary | ICD-10-CM

## 2014-06-27 DIAGNOSIS — T169XXA Foreign body in ear, unspecified ear, initial encounter: Secondary | ICD-10-CM

## 2014-06-27 DIAGNOSIS — T161XXA Foreign body in right ear, initial encounter: Secondary | ICD-10-CM

## 2014-06-27 MED ORDER — CIPROFLOXACIN-HYDROCORTISONE 0.2-1 % OT SUSP: 3.0000 [drp] | Freq: Two times a day (BID) | OTIC | Status: DC

## 2014-06-27 NOTE — Progress Notes (Signed)
[facility] [address], [place] [address] [phone]  Date:  06/27/2014   Name:  [name]   DOB:  [birth year]   MRN:  [id number]  PCP:  Default, Provider, MD    Chief Complaint: Otalgia   History of Present Illness:  [name] is a 28 y.o. very pleasant female patient who presents with the following:  Patient used a q-tip to clean her ear a week ago and lost the cotton.  Has pain in right ear.  No drainage.  Denies other complaint or health concern today.   Patient Active Problem List   Diagnosis Date Noted  . Chronic [name] 03/30/2013  . Bacterial vaginosis 03/30/2013  . Pyelonephritis 03/28/2013  . Polysubstance abuse 03/28/2013  . 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 Polysubstance dependence including opioid type drug, continuous use 06/22/2012    Class: Chronic  . Substance induced mood disorder 06/22/2012    Class: Chronic  . Smokes tobacco daily 01/28/2007  . DEPRESSIVE DISORDER, NOS 01/28/2007    Past Medical History  Diagnosis Date  . PTSD (post-traumatic stress disorder)   . Depression   . Anxiety   . Ankle pain, chronic     Past Surgical History  Procedure Laterality Date  . Appendectomy    . Tubal ligation      2009  . Ankle surgery      History  Substance Use Topics  . Smoking status: Current Every Day Smoker -- 1.00 packs/day    Types: Cigarettes  . Smokeless tobacco: Never Used  . Alcohol Use: No     Comment: occasionally    History reviewed. No pertinent family history.  Allergies  Allergen Reactions  . Penicillins Cross Reactors Hives    Medication list has been reviewed and updated.  No current outpatient prescriptions on file prior to visit.   No current facility-administered medications on file prior to visit.    Review of Systems:  As per HPI, otherwise negative.    Physical Examination: Filed Vitals:   06/27/14 1802  BP: 118/60  Pulse: 71  Temp: 98.2 F (36.8 C)  Resp: 12   Filed  Vitals:   06/27/14 1802  Height: 5\' 8"  (1.727 m)  Weight: 182 lb 4 oz (82.668 kg)   Body mass index is 27.72 kg/(m^2). Ideal Body Weight: Weight in (lb) to have BMI = 25: 164.1   GEN: WDWN, NAD, Non-toxic, Alert & Oriented x 3 HEENT: Atraumatic, Normocephalic.  Ears and Nose: No external deformity.  Cotton deep in external canal EXTR: No clubbing/cyanosis/edema NEURO: Normal gait.  PSYCH: Normally interactive. Conversant. Not depressed or anxious appearing.  Calm demeanor.    Assessment and Plan: FB external ear removed under direct vision atraumatically.    Signed,  Ellison Carwin, MD

## 2014-06-27 NOTE — Patient Instructions (Signed)

## 2015-05-02 ENCOUNTER — Encounter (HOSPITAL_COMMUNITY): Payer: Self-pay

## 2015-05-02 ENCOUNTER — Emergency Department (HOSPITAL_COMMUNITY)
Admission: EM | Admit: 2015-05-02 | Discharge: 2015-05-02 | Disposition: A | Discharge status: Home / Self Care | Payer: Non-veteran care | Attending: Emergency Medicine | Admitting: Emergency Medicine

## 2015-05-02 DIAGNOSIS — T50905A Adverse effect of unspecified drugs, medicaments and biological substances, initial encounter: Secondary | ICD-10-CM | POA: Diagnosis not present

## 2015-05-02 DIAGNOSIS — Z3202 Encounter for pregnancy test, result negative: Secondary | ICD-10-CM | POA: Diagnosis not present

## 2015-05-02 DIAGNOSIS — Z720 Tobacco use: Secondary | ICD-10-CM | POA: Insufficient documentation

## 2015-05-02 DIAGNOSIS — F1410 Cocaine abuse, uncomplicated: Secondary | ICD-10-CM | POA: Diagnosis not present

## 2015-05-02 DIAGNOSIS — G8929 Other chronic pain: Secondary | ICD-10-CM | POA: Insufficient documentation

## 2015-05-02 DIAGNOSIS — F1110 Opioid abuse, uncomplicated: Secondary | ICD-10-CM | POA: Diagnosis not present

## 2015-05-02 DIAGNOSIS — Z880 Allergy status to penicillin: Secondary | ICD-10-CM | POA: Diagnosis not present

## 2015-05-02 DIAGNOSIS — Z79899 Other long term (current) drug therapy: Secondary | ICD-10-CM | POA: Insufficient documentation

## 2015-05-02 DIAGNOSIS — G2571 Drug induced akathisia: Secondary | ICD-10-CM

## 2015-05-02 DIAGNOSIS — M79662 Pain in left lower leg: Secondary | ICD-10-CM | POA: Diagnosis present

## 2015-05-02 LAB — CBC WITH DIFFERENTIAL/PLATELET
Basophils Absolute: 0.1 10*3/uL (ref 0.0–0.1)
Basophils Relative: 1 % (ref 0–1)
Eosinophils Absolute: 0.2 10*3/uL (ref 0.0–0.7)
Eosinophils Relative: 2 % (ref 0–5)
HCT: 35.2 % — ABNORMAL LOW (ref 36.0–46.0)
Hemoglobin: 11.7 g/dL — ABNORMAL LOW (ref 12.0–15.0)
Lymphocytes Relative: 36 % (ref 12–46)
Lymphs Abs: 3.1 10*3/uL (ref 0.7–4.0)
MCH: 28.5 pg (ref 26.0–34.0)
MCHC: 33.2 g/dL (ref 30.0–36.0)
MCV: 85.6 fL (ref 78.0–100.0)
Monocytes Absolute: 0.6 10*3/uL (ref 0.1–1.0)
Monocytes Relative: 7 % (ref 3–12)
Neutro Abs: 4.6 10*3/uL (ref 1.7–7.7)
Neutrophils Relative %: 54 % (ref 43–77)
Platelets: 363 10*3/uL (ref 150–400)
RBC: 4.11 MIL/uL (ref 3.87–5.11)
RDW: 13.3 % (ref 11.5–15.5)
WBC: 8.5 10*3/uL (ref 4.0–10.5)

## 2015-05-02 LAB — RAPID URINE DRUG SCREEN, HOSP PERFORMED
Amphetamines: NOT DETECTED
Barbiturates: NOT DETECTED
Benzodiazepines: NOT DETECTED
Cocaine: POSITIVE — AB
Opiates: POSITIVE — AB
Tetrahydrocannabinol: NOT DETECTED

## 2015-05-02 LAB — COMPREHENSIVE METABOLIC PANEL
ALT: 16 U/L (ref 14–54)
AST: 25 U/L (ref 15–41)
Albumin: 3.8 g/dL (ref 3.5–5.0)
Alkaline Phosphatase: 41 U/L (ref 38–126)
Anion gap: 7 (ref 5–15)
BUN: 11 mg/dL (ref 6–20)
CO2: 25 mmol/L (ref 22–32)
Calcium: 9 mg/dL (ref 8.9–10.3)
Chloride: 105 mmol/L (ref 101–111)
Creatinine, Ser: 0.8 mg/dL (ref 0.44–1.00)
GFR calc Af Amer: >60 mL/min (ref >60)
GFR calc non Af Amer: >60 mL/min (ref >60)
Glucose, Bld: 90 mg/dL (ref 65–99)
Potassium: 3.9 mmol/L (ref 3.5–5.1)
Sodium: 137 mmol/L (ref 135–145)
Total Bilirubin: 0.7 mg/dL (ref 0.3–1.2)
Total Protein: 7.2 g/dL (ref 6.5–8.1)

## 2015-05-02 LAB — PREGNANCY, URINE: Preg Test, Ur: NEGATIVE

## 2015-05-02 MED ORDER — LORAZEPAM 1 MG PO TABS
1.0000 mg | ORAL_TABLET | Freq: Once | ORAL | Status: AC
  Administered 2015-05-02: 1 mg via ORAL
  Filled 2015-05-02: qty 1

## 2015-05-02 MED ORDER — AMMONIA AROMATIC IN INHA
RESPIRATORY_TRACT | Status: AC
  Administered 2015-05-02: 12:00:00
  Filled 2015-05-02: qty 10

## 2015-05-02 NOTE — ED Provider Notes (Signed)
CSN: [id number]     Arrival date & time 05/02/15  2297 History   First MD Initiated Contact with Patient 05/02/15 [phone]     Chief Complaint  Patient presents with  . Leg Pain     (Consider location/radiation/quality/duration/timing/severity/associated sxs/prior Treatment) HPI   28yF with "spasms" and LE weakness. Reports onset about an hour prior to arrival and shortly after injecting heroin. Long standing history of opiate and cocaine abuse. Has used both within last 24 hours. Reports never had symptoms like this before. Uncontrollable movement of arms. Cannot walk because "my legs don't work." Denies trauma. No acute pain aside from "these spasms." No prescribed medications. No acute visual complaints. No fever or chills.   Past Medical History  Diagnosis Date  . PTSD (post-traumatic stress disorder)   . Depression   . Anxiety   . Ankle pain, chronic    Past Surgical History  Procedure Laterality Date  . Appendectomy    . Tubal ligation      2009  . Ankle surgery     History reviewed. No pertinent family history. History  Substance Use Topics  . Smoking status: Current Every Day Smoker -- 1.00 packs/day    Types: Cigarettes  . Smokeless tobacco: Never Used  . Alcohol Use: No     Comment: occasionally   OB History    No data available     Review of Systems  All systems reviewed and negative, other than as noted in HPI.   Allergies  Penicillins cross reactors  Home Medications   Prior to Admission medications   Medication Sig Start Date End Date Taking? Authorizing Provider  ciprofloxacin-hydrocortisone (CIPRO HC) otic suspension Place 3 drops into the right ear 2 (two) times daily. 06/27/14   Roselee Culver, MD   BP 135/75 mmHg  Pulse 83  Temp(Src) 98.9 F (37.2 C) (Oral)  Resp 18  SpO2 99% Physical Exam  Constitutional: She appears well-developed and well-nourished.  Laying in bed. Appears to be asleep but then will have abnormal movements and open  her eyes before seemingly falling back asleep.   HENT:  Head: Normocephalic and atraumatic.  Eyes: Conjunctivae and EOM are normal. Pupils are equal, round, and reactive to light. Right eye exhibits no discharge. Left eye exhibits no discharge.  Neck: Neck supple.  Cardiovascular: Normal rate, regular rhythm and normal heart sounds.  Exam reveals no gallop and no friction rub.   No murmur heard. Pulmonary/Chest: Effort normal and breath sounds normal. No respiratory distress.  Abdominal: Soft. She exhibits no distension. There is no tenderness.  Musculoskeletal: She exhibits no edema or tenderness.  Neurological:  Drowsy but opens eyes to voice and follows commands. Frequent abnormal movements. Appears restless. Twisting facial movements, waving of upper extremities, jerking of trunk. Abnormal movements of LE, but less frequently than upper. Strength 5/5 b/l UE. Pt reports she cannot move LE willingly although occasional spontaneous movement at b/l hips and knees. Muscle tone seems normal. No inducible clonus.   Skin: Skin is warm and dry. She is not diaphoretic.  Psychiatric: She has a normal mood and affect. Her behavior is normal. Thought content normal.  Nursing note and vitals reviewed.   ED Course  Procedures (including critical care time) Labs Review Labs Reviewed  CBC WITH DIFFERENTIAL/PLATELET - Abnormal; Notable for the following:    Hemoglobin 11.7 (*)    HCT 35.2 (*)    All other components within normal limits  COMPREHENSIVE METABOLIC PANEL  URINE RAPID DRUG  SCREEN (HOSP PERFORMED) NOT AT [facility], URINE    Imaging Review No results found.   EKG Interpretation   Date/Time:  Wednesday May 02 2015 06:47:26 EDT Ventricular Rate:  78 PR Interval:  152 QRS Duration: 84 QT Interval:  396 QTC Calculation: 451 R Axis:   44 Text Interpretation:  Sinus rhythm Probable left atrial enlargement  Confirmed by Wilson Singer  MD, Malon Branton ([id number]) on 05/02/2015 11:07:13 AM       MDM   Final diagnoses:  Drug induced akathisia    28yF with akathisia. Long standing hx of cocaine and opiate abuse. Has used both within past 24 hours. Suspect abnormal movements precipitated by cocaine. No prescribed psychotropic medications. Not moving LE on command but noted to move spontaneously. Will dose benzos. Basic labs. Have a reasonable explanation for symptoms neuroimaging deferred at this time. Will continue to observe.   Virgel Manifold, MD 05/02/15 [phone]

## 2015-05-02 NOTE — ED Notes (Signed)
Pt used heroin about one hour ago and then started having leg spasms and states she can't walk

## 2015-05-02 NOTE — ED Notes (Signed)
Two unsuccessful IV attempts by this writer.  

## 2015-05-02 NOTE — ED Notes (Signed)
Pt placed on bedpan and unable to void at this time.

## 2015-05-02 NOTE — Discharge Instructions (Signed)
Dystonias The dystonias are movement disorders in which sustained muscle contractions cause twisting and repetitive movements or abnormal postures. The movements, which are involuntary and sometimes painful, may affect a single muscle; a group of muscles such as those in the arms, legs, or neck; or the entire body. Early symptoms (problems) may include a deterioration in handwriting after writing several lines, foot cramps, and a tendency of one foot to pull up or drag after running or walking some distance. Other possible symptoms are tremor and voice or speech difficulties. Birth injury (particularly due to lack of oxygen), certain infections, reactions to certain drugs, heavy-metal or carbon monoxide poisoning, trauma (damage caused by an accident), or stroke can cause dystonic symptoms. About half the cases of dystonia have no connection to disease or injury and are called primary or idiopathic dystonia. Of the primary dystonias, many cases appear to be inherited in a dominant manner. Dystonias can also be symptoms of other diseases, some of which may be hereditary (passed down from parents). In some individuals, symptoms of a dystonia appear spontaneously in childhood between the ages of 41 and 23, usually in the foot or in the hand. For other individuals, the symptoms emerge in late adolescence or early adulthood. TREATMENT  No one treatment has been found universally effective for dystonia. Instead, physicians use a variety of therapies (medications, surgery and other treatments such as physical therapy, splinting, stress management, and biofeedback), aimed at reducing or eliminating muscle spasms and pain. Since response to drugs varies among patients and even in the same person over time, the therapy must be individualized. PROGNOSIS The initial symptoms can be very mild and may be noticeable only after prolonged exertion, stress, or fatigue. Over a period of time, the symptoms may become more  noticeable and widespread and be unrelenting; sometimes, however, there is little or no progression. RESEARCH BEING DONE Investigators believe that the dystonias result from an abnormality in an area of the brain called the basal ganglia, where some of the messages that initiate muscle contractions are processed. Scientists suspect a defect in the body's ability to process a group of chemicals called neurotransmitters that help cells in the brain communicate with each other. Scientists at the [facility] laboratories have conducted detailed investigations of the pattern of muscle activity in persons with dystonias. Studies using EEG analysis and neuroimaging are probing brain activity. The search for the gene or genes responsible for some forms of dominantly inherited dystonias continues. 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 In 1997, the team sequenced the DYT1 gene and found that it codes for a previously unknown protein now called "torsin A." Document Released: 11/07/2002 Document Revised: 02/09/2012 Document Reviewed: 01/11/2014 [facility] Patient Information 2015 [place], [place]. This information is not intended to replace advice given to you by your health care provider. Make sure you discuss any questions you have with your health care provider.   Emergency Department Resource Guide 1) Find a Doctor and Pay Out of Pocket Although you won't have to find out who is covered by your insurance plan, it is a good idea to ask around and get recommendations. You will then need to call the office and see if the doctor you have chosen will accept you as a new patient and what types of options they offer for patients who are self-pay. Some doctors offer discounts or will set up payment plans for their patients who do  not have insurance, but you will need to ask so you aren't surprised when you get to your appointment.  2)  Contact Your Local Health Department Not all health departments have doctors that can see patients for sick visits, but many do, so it is worth a call to see if yours does. If you don't know where your local health department is, you can check in your phone book. The CDC also has a tool to help you locate your state's health department, and many state websites also have listings of all of their local health departments.  3) Find a [facility] If your illness is not likely to be very severe or complicated, you may want to try a walk in clinic. These are popping up all over the country in pharmacies, drugstores, and shopping centers. They're usually staffed by nurse practitioners or physician assistants that have been trained to treat common illnesses and complaints. They're usually fairly quick and inexpensive. However, if you have serious medical issues or chronic medical problems, these are probably not your best option.  No Primary Care Doctor: - Call Health Connect at  [phone] - they can help you locate a primary care doctor that  accepts your insurance, provides certain services, etc. - Physician Referral Service- [phone]  Chronic Pain Problems: Organization         Address  Phone   Notes  [facility]  [phone] Patients need to be referred by their primary care doctor.   Medication Assistance: Organization         Address  Phone   Notes  [facility] Medication [facility] [address] [place], [place] [address] [phone] --Must be a resident of [facility] -- Must have NO insurance coverage whatsoever (no Medicaid/ Medicare, etc.) -- The pt. MUST have a primary care doctor that directs their care regularly and follows them in the community   Med[facility]ist  [phone]   [employer]  [phone]    Agencies that provide inexpensive medical care: Organization         Address  Phone   Notes  [place]   [phone]   [facility]    [phone]   [facility] [place], [place] [address] [phone]   [place] [address], [place] [phone]   Planned Parenthood    [phone]   [facility]    [phone]   [place] and [place] [address], [place] Phone:  [phone], Fax:  [phone] Hours of Operation:  9 am - 6 pm, M-F.  Also accepts Medicaid/Medicare and self-pay.  [facility] for [place], [address], [place] Phone: [phone], Fax: [phone]. Hours of Operation:  8:30 am - 5:30 pm, M-F.  Also accepts Medicaid and self-pay.  [facility] [address] [place] Phone: [phone]   [place], [place], [place] [phone], Ext. [phone] Mondays & Thursdays: 7-9 AM.  First 15 patients are seen on a first come, first serve basis.    [place] Providers:  Organization         Address  Phone   Notes  [facility] [address],  [phone] Also accepts self-pay patients.  [place] [phone]  [place], [place]  [phone]   [place], [address], [place] [phone]   [place] [address], [place] [phone]   Lucianne Lei [address], [place]   [phone] Only accepts [place] Access [place] patients after they have their name applied to their card.   Self-Pay (no insurance) in [facility]:  Organization         Address  Phone   Notes  Sickle Cell Patients, [facility] [place] [phone]   [facility] [place] [phone]   [facility] [place]  [place], [place], [place] [phone]   [facility]/Dr. Osei-Bonsu  [address] [place] or [address], [place] [phone] Phone number for both [place] and [place] locations is the same.  [facility] [address], [place] [phone]   [facility] [address] [place] or [address] [phone]   [facility] [address], [place] [phone], phone; [phone], fax Sees patients 1st and 3rd Saturday of every month.  Must not qualify for public or private insurance (i.e. Medicaid, Medicare, [place] Health Choice, Veterans' Benefits)  Household income should be no more than 200% of the poverty level The clinic cannot treat you if you are pregnant or think you are pregnant  Sexually transmitted diseases are not treated at the clinic.    Dental Care: Organization         Address  Phone  Notes  [facility] of [facility] [place] [phone] Accepts children up to age 24 who are enrolled in [place] or [place]; pregnant women with a Medicaid card; and children who have applied for Medicaid or [place] Health Choice, but were declined, whose parents can pay a reduced fee at time of service.  [facility] Department of [facility]  [address], [place] [phone] Accepts children up to age 32 who are enrolled in [place] or [place]; pregnant women with a Medicaid card; and children who have applied for Medicaid or [place] Health Choice, but were declined, whose parents can pay a reduced fee at time of service.  [place] Adult Dental Access PROGRAM  [place] [phone] Patients are seen by appointment only. Walk-ins are not accepted. [place] will see patients 46 years of age and older. Monday - Tuesday  (8am-5pm) Most Wednesdays (8:30-5pm) $30 per visit, cash only  [facility] Adult Dental Access PROGRAM  [address], [facility] [phone] Patients are seen by appointment only. Walk-ins are not accepted. [place] will see patients 27 years of age and older. One Wednesday Evening (Monthly: Volunteer Based).  $30 per visit, cash only  [place]  [phone] for adults; Children under age 10, call Graduate Pediatric Dentistry at [phone]. Children aged 9-14, please call [phone] to request a pediatric application.  Dental services are provided in all areas of dental care including fillings, crowns and bridges, complete and partial dentures, implants, gum treatment, root canals, and extractions. Preventive care is also provided. Treatment is provided to both adults and children. Patients are  selected via a lottery and there is often a waiting list.   [facility] [address], [place]  [phone] [email or web address]   [facility] [address] [place], [place] [phone], Ext. [phone] Second and Fourth Thursday of each month, opens at 6:30 AM; Clinic ends at 9 AM.  Patients are seen on a first-come first-served basis, and a limited number are seen during each clinic.   [facility]  [address] Hillard Danker [place], [place] [phone]   Eligibility Requirements You must have lived in [place], [place], or [place] counties for at least the last three months.   You cannot be eligible for state or federal sponsored [employer], including [employer], [place], or [employer].   You generally cannot be eligible for healthcare insurance through your employer.    How to apply: Eligibility screenings are held every Tuesday and Wednesday afternoon from 1:00 pm until 4:00 pm. You do not need an appointment for the interview!  [facility] [address], [place], [place]   [place]  [place] Department  [place]  [phone]    Behavioral Health Resources in the Community: Intensive Outpatient Programs Organization         Address  Phone  Notes  [place]. [address] [place], [place] [phone]   [facility] [address], [place], [place]   ADS: Alcohol & Drug Svcs [address], [place], [place]   [place] [address],  [place], [place] or [phone]   Substance Abuse Resources Organization         Address  Phone  Notes  Alcohol and Drug Services  [phone]   [place]  [phone]   [facility] [place]   Chinita Pester  [phone]   Residential & Outpatient Substance Abuse Program  [phone]   Psychological Services Organization         Address  Phone  Notes  [facility] [place]  [place]  [phone]   [place] [address] [place] or [phone]    Mobile Crisis Teams Organization         Address  Phone  Notes  [facility], Mobile Crisis Care Unit  [phone]   [facility]  [address] [place], [place]   [place] [address], [place] [phone]    Self-Help/Support Groups Organization         Address  Phone             Notes  [place]. of [place] - variety of support groups  [place] Call for more information  Narcotics Anonymous (NA), [facility] [address], [employer]   2 meetings at this location   Special educational needs teacher         Address  Phone  Notes  [facility] Residential Treatment [place],    [place]  [phone]   [facility]  [address] [place] [id number], [place], [place]   [place], [place] [phone] Admissions: 8am-3pm M-F  Incentives Substance [place] [address]    [place], [place]   [facility] [address] Jacinto Reap [place], [place]   [facility] [address],  [place], [place]   [facility] - Intensive Outpatient [address], [place], [place]   [facility] [address]  [place], [place] [phone] or [phone]   [facility] Services (RTS) [address], [place], [place] Accepts Medicaid  [facility] [place] [address],  [place] [phone] Substance Abuse/Addiction Treatment   [facility] Organization         Address  Phone  Notes  [facility]  [phone]   Domenic Schwab, PhD [address] [place], [place]   [phone] or [phone]   [place], [place] [phone]   [place] [address], [place], [place] [phone] Insurance/Medicaid/sponsorship through [facility] and Families [address], [address]                                    [place], [place] [phone] [place] [address], [place] [phone]    Dr. Adele Schilder  [phone]   [facility] [place] Dept. 1) [address] [place] 2) [place] 3)  [place] [address], [place] [phone]  [phone]   [place] [phone] or [phone] (After Hours)

## 2015-09-15 ENCOUNTER — Emergency Department (HOSPITAL_COMMUNITY)
Admission: EM | Admit: 2015-09-15 | Discharge: 2015-09-15 | Discharge status: Against Medical Advice | Payer: Non-veteran care | Attending: Emergency Medicine | Admitting: Emergency Medicine

## 2015-09-15 ENCOUNTER — Encounter (HOSPITAL_COMMUNITY): Payer: Self-pay | Admitting: Emergency Medicine

## 2015-09-15 DIAGNOSIS — Z720 Tobacco use: Secondary | ICD-10-CM | POA: Diagnosis not present

## 2015-09-15 DIAGNOSIS — F1990 Other psychoactive substance use, unspecified, uncomplicated: Secondary | ICD-10-CM | POA: Diagnosis present

## 2015-09-15 DIAGNOSIS — G8929 Other chronic pain: Secondary | ICD-10-CM | POA: Insufficient documentation

## 2015-09-15 NOTE — ED Notes (Signed)
Bed: VN50 Expected date:  Expected time:  Means of arrival:  Comments: EMS heroin HR 140

## 2015-10-31 NOTE — ED Provider Notes (Cosign Needed)
CSN: [id number]     Arrival date & time 09/15/15  0151 History   First MD Initiated Contact with Patient 09/15/15 0158     Chief Complaint  Patient presents with  . Drug Problem     (Consider location/radiation/quality/duration/timing/severity/associated sxs/prior Treatment) Patient is a 29 y.o. female presenting with drug problem. The history is provided by the patient and the EMS personnel. No language interpreter was used.  Drug Problem Pertinent negatives include no chills or fever. Associated symptoms comments: The patient arrives via EMS after they responded to a call for unresponsive patient. The patient arrives into the emergency department awake and alert with a history of "partying" all day with "friends from [place] who had lots of drugs." She admits to drinking and using but does not identify the type of drugs she used. Currently, she feels tired but has no complaints. She denies intentional overdosing, SI/HI or hallucinations. .    Past Medical History  Diagnosis Date  . PTSD (post-traumatic stress disorder)   . Depression   . Anxiety   . Ankle pain, chronic    Past Surgical History  Procedure Laterality Date  . Appendectomy    . Tubal ligation      2009  . Ankle surgery     History reviewed. No pertinent family history. Social History  Substance Use Topics  . Smoking status: Current Every Day Smoker -- 1.00 packs/day    Types: Cigarettes  . Smokeless tobacco: Never Used  . Alcohol Use: No     Comment: occasionally   OB History    No data available     Review of Systems  Constitutional: Negative for fever and chills.  HENT: Negative.   Respiratory: Negative.   Cardiovascular: Negative.   Gastrointestinal: Negative.   Musculoskeletal: Negative.   Skin: Negative.   Neurological: Negative.       Allergies  Penicillins cross reactors  Home Medications   Prior to Admission medications   Medication Sig Start Date End Date Taking? Authorizing  Provider  ciprofloxacin-hydrocortisone (CIPRO HC) otic suspension Place 3 drops into the right ear 2 (two) times daily. Patient not taking: Reported on 05/02/2015 06/27/14   Roselee Culver, MD   BP 123/87 mmHg  Pulse 105  Temp(Src) 98.8 F (37.1 C) (Oral)  Resp 26  SpO2 100% Physical Exam  Constitutional: She is oriented to person, place, and time. She appears well-developed and well-nourished.  HENT:  Head: Normocephalic.  Eyes: Conjunctivae are normal. Pupils are equal, round, and reactive to light.  Neck: Normal range of motion. Neck supple.  Cardiovascular: Normal rate and regular rhythm.   Pulmonary/Chest: Effort normal and breath sounds normal.  Abdominal: Soft. Bowel sounds are normal. There is no tenderness. There is no rebound and no guarding.  Musculoskeletal: Normal range of motion.  There are no injection sites on extremities visualized.  Neurological: She is alert and oriented to person, place, and time. Coordination normal.  CN's intact. Speech clear and focused. Ambulatory with steady gait. No deficits or coordination.  Skin: Skin is warm and dry. No rash noted.  Psychiatric: She has a normal mood and affect.    ED Course  Procedures (including critical care time) Labs Review Labs Reviewed - No data to display  Imaging Review No results found. I have personally reviewed and evaluated these images and lab results as part of my medical decision-making.   EKG Interpretation   Date/Time:  Saturday September 15 2015 01:53:37 EDT Ventricular Rate:  102 PR  Interval:  134 QRS Duration: 83 QT Interval:  377 QTC Calculation: 491 R Axis:   71 Text Interpretation:  Sinus tachycardia Probable left atrial enlargement  Borderline prolonged QT interval ED PHYSICIAN INTERPRETATION AVAILABLE IN  [facility] Confirmed by TEST, Record ([id number]) on 09/16/2015 8:08:05 AM      MDM   Final diagnoses:  None    1. Altered mental status  The patient is awake and alert  in the department. She admits to excessive drug and alcohol use throughout the day but denies intentional self harm. She is oriented, ambulatory and VS are improved over initial reports of significant tachycardia by EMS. She is felt stable for discharge home. Outpatient resources for drug and alcohol treatment provided.    Charlann Lange, PA-C 10/31/15 1820

## 2016-02-02 ENCOUNTER — Encounter (HOSPITAL_COMMUNITY): Payer: Self-pay | Admitting: Emergency Medicine

## 2016-02-02 ENCOUNTER — Emergency Department (HOSPITAL_COMMUNITY)
Admission: EM | Admit: 2016-02-02 | Discharge: 2016-02-02 | Discharge status: Against Medical Advice | Payer: Non-veteran care | Attending: Emergency Medicine | Admitting: Emergency Medicine

## 2016-02-02 DIAGNOSIS — G8929 Other chronic pain: Secondary | ICD-10-CM | POA: Insufficient documentation

## 2016-02-02 DIAGNOSIS — Z8659 Personal history of other mental and behavioral disorders: Secondary | ICD-10-CM | POA: Insufficient documentation

## 2016-02-02 DIAGNOSIS — F17210 Nicotine dependence, cigarettes, uncomplicated: Secondary | ICD-10-CM | POA: Insufficient documentation

## 2016-02-02 DIAGNOSIS — M545 Low back pain, unspecified: Secondary | ICD-10-CM

## 2016-02-02 DIAGNOSIS — Z3202 Encounter for pregnancy test, result negative: Secondary | ICD-10-CM | POA: Insufficient documentation

## 2016-02-02 LAB — URINALYSIS, ROUTINE W REFLEX MICROSCOPIC
Bilirubin Urine: NEGATIVE
Glucose, UA: NEGATIVE mg/dL
Ketones, ur: NEGATIVE mg/dL
LEUKOCYTES UA: NEGATIVE
Nitrite: NEGATIVE
Protein, ur: NEGATIVE mg/dL
SPECIFIC GRAVITY, URINE: 1.021 (ref 1.005–1.030)
pH: 5.5 (ref 5.0–8.0)

## 2016-02-02 LAB — URINE MICROSCOPIC-ADD ON

## 2016-02-02 LAB — POC URINE PREG, ED: PREG TEST UR: NEGATIVE

## 2016-02-02 MED ORDER — BUPIVACAINE HCL (PF) 0.5 % IJ SOLN
20.0000 mL | Freq: Once | INTRAMUSCULAR | Status: DC
Start: 2016-02-02 — End: 2016-02-02

## 2016-02-02 MED ORDER — IBUPROFEN 800 MG PO TABS
800.0000 mg | ORAL_TABLET | Freq: Once | ORAL | Status: DC
Start: 2016-02-02 — End: 2016-02-02

## 2016-02-02 NOTE — ED Notes (Signed)
MD at bedside. 

## 2016-02-02 NOTE — ED Notes (Signed)
Per pt, states lower back pain and rash since yesterday-states belching a lot

## 2016-02-02 NOTE — ED Provider Notes (Signed)
CSN: [id number]     Arrival date & time 02/02/16  1426 History   First MD Initiated Contact with Patient 02/02/16 1716     Chief Complaint  Patient presents with  . Back Pain     (Consider location/radiation/quality/duration/timing/severity/associated sxs/prior Treatment) Patient is a 30 y.o. female presenting with back pain. The history is provided by the patient.  Back Pain Location:  Lumbar spine Quality:  Cramping and aching Radiates to:  Does not radiate Pain severity:  Moderate Pain is:  Same all the time Onset quality:  Gradual Duration:  1 day Timing:  Constant Progression:  Unchanged Chronicity:  New Relieved by:  Nothing Worsened by:  Nothing tried Ineffective treatments:  None tried Associated symptoms: no fever, no numbness and no weight loss   Associated symptoms comment:  Vaginal bleeding   Past Medical History  Diagnosis Date  . PTSD (post-traumatic stress disorder)   . Depression   . Anxiety   . Ankle pain, chronic    Past Surgical History  Procedure Laterality Date  . Appendectomy    . Tubal ligation      2009  . Ankle surgery     No family history on file. Social History  Substance Use Topics  . Smoking status: Current Every Day Smoker -- 1.00 packs/day    Types: Cigarettes  . Smokeless tobacco: Never Used  . Alcohol Use: No     Comment: occasionally   OB History    No data available     Review of Systems  Constitutional: Negative for fever and weight loss.  Musculoskeletal: Positive for back pain.  Neurological: Negative for numbness.  All other systems reviewed and are negative.     Allergies  Penicillins cross reactors  Home Medications   Prior to Admission medications   Medication Sig Start Date End Date Taking? Authorizing Provider  ciprofloxacin-hydrocortisone (CIPRO HC) otic suspension Place 3 drops into the right ear 2 (two) times daily. Patient not taking: Reported on 05/02/2015 06/27/14   Roselee Culver, MD   BP  133/87 mmHg  Pulse 103  Temp(Src) 98.9 F (37.2 C) (Oral)  Resp 18  SpO2 98%  LMP 02/02/2016 Physical Exam  Constitutional: She is oriented to person, place, and time. She appears well-developed and well-nourished. No distress.  HENT:  Head: Normocephalic.  Eyes: Conjunctivae are normal.  Neck: Neck supple. No tracheal deviation present.  Cardiovascular: Normal rate and regular rhythm.   Pulmonary/Chest: Effort normal. No respiratory distress.  Abdominal: Soft. She exhibits no distension.  Musculoskeletal:       Lumbar back: She exhibits tenderness (bilateral paraspinal muscles) and pain. She exhibits normal range of motion, no deformity and no spasm.  Neurological: She is alert and oriented to person, place, and time.  Skin: Skin is warm and dry.  Psychiatric: She has a normal mood and affect.    ED Course  Procedures (including critical care time) Labs Review Labs Reviewed  URINALYSIS, ROUTINE W REFLEX MICROSCOPIC (NOT AT [facility]) - Abnormal; Notable for the following:    Hgb urine dipstick SMALL (*)    All other components within normal limits  URINE MICROSCOPIC-ADD ON - Abnormal; Notable for the following:    Squamous Epithelial / LPF 0-5 (*)    Bacteria, UA RARE (*)    All other components within normal limits  URINE CULTURE  WET PREP, GENITAL  POC URINE PREG, ED  GC/CHLAMYDIA PROBE AMP ([place]) NOT AT [facility]    Imaging Review No results  found. I have personally reviewed and evaluated these images and lab results as part of my medical decision-making.   EKG Interpretation None      MDM   Final diagnoses:  Bilateral low back pain without sciatica    30 y.o. female presents with new onset low back pain. She said pain started yesterday. She has had increased flatulence and belching. She is pacing back and forth in the room is very concerned about her pain. She has point tenderness to palpation over her bilateral paraspinal muscles in her lumbar area. She is  otherwise well-appearing. Screening urine shows no evidence of infection. Her pain appears musculoskeletal and I recommended that she start stretching and exercise as definitive therapy as well as anti-inflammatory medications for the onset low back pain if workup is negative. No reflex symptoms. The patient is concerned that she has had vaginal bleeding and is requesting an evaluation. Orders placed for trigger point injection and pelvic exam, patient eloped unannounced. No indication to hold the patient against her will.    Leo Grosser, MD 02/02/16 [id number]

## 2016-02-02 NOTE — ED Notes (Signed)
Pt and family not in room. Made efforts to look for pt in ED.

## 2016-02-02 NOTE — ED Notes (Signed)
EDP KNOTT MADE AWARE OF PT'S ELOPEMENT.

## 2016-02-04 ENCOUNTER — Emergency Department (HOSPITAL_COMMUNITY)
Admission: EM | Admit: 2016-02-04 | Discharge: 2016-02-04 | Disposition: A | Discharge status: Home / Self Care | Payer: Non-veteran care | Attending: Emergency Medicine | Admitting: Emergency Medicine

## 2016-02-04 ENCOUNTER — Encounter (HOSPITAL_COMMUNITY): Payer: Self-pay | Admitting: Emergency Medicine

## 2016-02-04 DIAGNOSIS — G8929 Other chronic pain: Secondary | ICD-10-CM | POA: Insufficient documentation

## 2016-02-04 DIAGNOSIS — Z87448 Personal history of other diseases of urinary system: Secondary | ICD-10-CM | POA: Insufficient documentation

## 2016-02-04 DIAGNOSIS — M545 Low back pain: Secondary | ICD-10-CM | POA: Diagnosis present

## 2016-02-04 DIAGNOSIS — F1910 Other psychoactive substance abuse, uncomplicated: Secondary | ICD-10-CM | POA: Diagnosis not present

## 2016-02-04 DIAGNOSIS — F1920 Other psychoactive substance dependence, uncomplicated: Secondary | ICD-10-CM

## 2016-02-04 DIAGNOSIS — Z8659 Personal history of other mental and behavioral disorders: Secondary | ICD-10-CM | POA: Insufficient documentation

## 2016-02-04 DIAGNOSIS — F17210 Nicotine dependence, cigarettes, uncomplicated: Secondary | ICD-10-CM | POA: Diagnosis not present

## 2016-02-04 DIAGNOSIS — N730 Acute parametritis and pelvic cellulitis: Secondary | ICD-10-CM | POA: Insufficient documentation

## 2016-02-04 DIAGNOSIS — Z880 Allergy status to penicillin: Secondary | ICD-10-CM | POA: Diagnosis not present

## 2016-02-04 DIAGNOSIS — Z3202 Encounter for pregnancy test, result negative: Secondary | ICD-10-CM | POA: Insufficient documentation

## 2016-02-04 LAB — POC URINE PREG, ED: PREG TEST UR: NEGATIVE

## 2016-02-04 LAB — URINALYSIS, ROUTINE W REFLEX MICROSCOPIC
BILIRUBIN URINE: NEGATIVE
Glucose, UA: NEGATIVE mg/dL
HGB URINE DIPSTICK: NEGATIVE
Ketones, ur: NEGATIVE mg/dL
Nitrite: NEGATIVE
PROTEIN: NEGATIVE mg/dL
SPECIFIC GRAVITY, URINE: 1.022 (ref 1.005–1.030)
pH: 6.5 (ref 5.0–8.0)

## 2016-02-04 LAB — RAPID HIV SCREEN (HIV 1/2 AB+AG)
HIV 1/2 Antibodies: NONREACTIVE
HIV-1 P24 Antigen - HIV24: NONREACTIVE

## 2016-02-04 LAB — URINE CULTURE: CULTURE: NO GROWTH

## 2016-02-04 LAB — WET PREP, GENITAL
SPERM: NONE SEEN
TRICH WET PREP: NONE SEEN
Yeast Wet Prep HPF POC: NONE SEEN

## 2016-02-04 LAB — URINE MICROSCOPIC-ADD ON

## 2016-02-04 MED ORDER — LIDOCAINE HCL (PF) 1 % IJ SOLN
2.0000 mL | Freq: Once | INTRAMUSCULAR | Status: AC
  Administered 2016-02-04: 0.9 mL
  Filled 2016-02-04: qty 5

## 2016-02-04 MED ORDER — CEFTRIAXONE SODIUM 250 MG IJ SOLR
250.0000 mg | Freq: Once | INTRAMUSCULAR | Status: AC
  Administered 2016-02-04: 250 mg via INTRAMUSCULAR
  Filled 2016-02-04: qty 250

## 2016-02-04 MED ORDER — DOXYCYCLINE HYCLATE 100 MG PO CAPS: 100.0000 mg | ORAL_CAPSULE | Freq: Two times a day (BID) | ORAL | Status: DC

## 2016-02-04 MED ORDER — AZITHROMYCIN 250 MG PO TABS
1000.0000 mg | ORAL_TABLET | Freq: Once | ORAL | Status: AC
  Administered 2016-02-04: 1000 mg via ORAL
  Filled 2016-02-04: qty 4

## 2016-02-04 NOTE — ED Notes (Addendum)
Upon triage pt reports dysuria; seen 2 days ago for same; urinalysis negative. Tatyana PA aware of pt status and complaint are states will is in fast track.

## 2016-02-04 NOTE — Discharge Instructions (Signed)
Take doxycycline as prescribed until all gone. Stop using illicit substances. Follow up as needed.    Pelvic Inflammatory Disease Pelvic inflammatory disease (PID) is an infection in some or all of the female organs. PID can be in the uterus, ovaries, fallopian tubes, or the surrounding tissues that are inside the lower belly area (pelvis). PID can lead to lasting problems if it is not treated. To check for this disease, your doctor may:  Do a physical exam.  Do blood tests, urine tests, or a pregnancy test.  Look at your vaginal discharge.  Do tests to look inside the pelvis.  Test you for other infections. HOME CARE  Take over-the-counter and prescription medicines only as told by your doctor.  If you were prescribed an antibiotic medicine, take it as told by your doctor. Do not stop taking it even if you start to feel better.  Do not have sex until treatment is done or as told by your doctor.  Tell your sex partner if you have PID. Your partner may need to be treated.  Keep all follow-up visits as told by your doctor. This is important.  Your doctor may test you for infection again 3 months after you are treated. GET HELP IF:  You have more fluid (discharge) coming from your vagina or fluid that is not normal.  Your pain does not improve.  You throw up (vomit).  You have a fever.  You cannot take your medicines.  Your partner has a sexually transmitted disease (STD).  You have pain when you pee (urinate). GET HELP RIGHT AWAY IF:  You have more belly (abdominal) or lower belly pain.  You have chills.  You are not better after 72 hours.   This information is not intended to replace advice given to you by your health care provider. Make sure you discuss any questions you have with your health care provider.   Document Released: 02/13/2009 Document Revised: 08/08/2015 Document Reviewed: 12/25/2014 Elsevier Interactive Patient Education 2016 [place] dependency is an addiction to drugs or alcohol. It is characterized by the repeated behavior of seeking out and using drugs and alcohol despite harmful consequences to the health and safety of ones self and others.  RISK FACTORS There are certain situations or behaviors that increase a person's risk for chemical dependency. These include:  A family history of chemical dependency.  A history of mental health issues, including depression and anxiety.  A home environment where drugs and alcohol are easily available to you.  Drug or alcohol use at a young age. SYMPTOMS  The following symptoms can indicate chemical dependency:  Inability to limit the use of drugs or alcohol.  Nausea, sweating, shakiness, and anxiety that occurs when alcohol or drugs are not being used.  An increase in amount of drugs or alcohol that is necessary to get drunk or high. People who experience these symptoms can assess their use of drugs and alcohol by asking themselves the following questions:  Have you been told by friends or family that they are worried about your use of alcohol or drugs?  Do friends and family ever tell you about things you did while drinking alcohol or using drugs that you do not remember?  Do you lie about using alcohol or drugs or about the amounts you use?  Do you have difficulty completing daily tasks unless you use alcohol or drugs?  Is the level of your work or school performance lower because  of your drug or alcohol use?  Do you get sick from using drugs or alcohol but keep using anyway?  Do you feel uncomfortable in social situations unless you use alcohol or drugs?  Do you use drugs or alcohol to help forget problems? An answer of yes to any of these questions may indicate chemical dependency. Professional evaluation is suggested.   This information is not intended to replace advice given to you by your health care provider. Make sure  you discuss any questions you have with your health care provider.   Document Released: 11/11/2001 Document Revised: 02/09/2012 Document Reviewed: 01/23/2011 Elsevier Interactive Patient Education [employer].

## 2016-02-04 NOTE — ED Notes (Signed)
PT walked to and from restroom

## 2016-02-04 NOTE — ED Notes (Signed)
Pt sleeping waiting on ride

## 2016-02-04 NOTE — ED Notes (Signed)
Unable to obtain a blood pressure on the pt. Pt will not keep her arm still and keeps trying to take the cuff off.

## 2016-02-04 NOTE — ED Notes (Signed)
Called family member again, no answer

## 2016-02-04 NOTE — ED Notes (Signed)
Attempted to call ride, no answer multiple times left message.  PA states she has to leave with someone.

## 2016-02-04 NOTE — ED Notes (Signed)
Bed: WA07 Expected date:  Expected time:  Means of arrival:  Comments: EMS- elderly, weakness/recent stroke

## 2016-02-04 NOTE — ED Notes (Addendum)
Pt noted constantly moving and ambulated to bed with unsteady gait x 2 assist. Pt reported using cocaine and heroin. Speech garble. Noted having visual/audible hallucinations. Noted talking to self and reaching out for items. Constantly moving in bed. Pt goes to sleep but easily arousable.

## 2016-02-04 NOTE — ED Notes (Addendum)
Per EMS pt complaint of lower back pain without injury. Admits to cocaine and heroin use.

## 2016-02-04 NOTE — ED Provider Notes (Signed)
CSN: [id number]     Arrival date & time 02/04/16  1411 History  By signing my name below, I, Emmanuella Mensah, attest that this documentation has been prepared under the direction and in the presence of Samya Siciliano, PA-C. Electronically Signed: Judithann Sauger, ED Scribe. 02/04/2016. 2:39 PM.    Chief Complaint  Patient presents with  . Back Pain   The history is provided by the patient. No language interpreter was used.   HPI Comments: [name] is a 30 y.o. female who presents to the Emergency Department complaining of gradually worsening moderate constant lower back pain onset 2 days ago. She reports associated dysuria and vaginal discharge. She states that she was recently diagnosed with BV on 01/31/16 and treated as she continued to have green vaginal discharge. Pt reports that she is here because she believes that she has a UTI. She denies any recent injuries or trauma. She also denies that she is currently pregnant. No fever, chills, or n/v. Pt is currently tearful and admits to cocaine use PTA.    Past Medical History  Diagnosis Date  . PTSD (post-traumatic stress disorder)   . Depression   . Anxiety   . Ankle pain, chronic    Past Surgical History  Procedure Laterality Date  . Appendectomy    . Tubal ligation      2009  . Ankle surgery     No family history on file. Social History  Substance Use Topics  . Smoking status: Current Every Day Smoker -- 1.00 packs/day    Types: Cigarettes  . Smokeless tobacco: Never Used  . Alcohol Use: No     Comment: occasionally   OB History    No data available     Review of Systems  Constitutional: Negative for fever and chills.  Gastrointestinal: Negative for nausea and vomiting.  Genitourinary: Positive for dysuria and vaginal discharge.  Musculoskeletal: Positive for back pain.      Allergies  Penicillins cross reactors  Home Medications   Prior to Admission medications   Medication Sig Start Date End Date  Taking? Authorizing Provider  ciprofloxacin-hydrocortisone (CIPRO HC) otic suspension Place 3 drops into the right ear 2 (two) times daily. Patient not taking: Reported on 05/02/2015 06/27/14   Roselee Culver, MD   BP 123/86 mmHg  Pulse 107  Temp(Src) 98.9 F (37.2 C)  Resp 18  SpO2 97%  LMP 02/02/2016 Physical Exam  Constitutional: She is oriented to person, place, and time. She appears well-developed and well-nourished.  Pt is tearful. Appears to be intoxicated  HENT:  Head: Normocephalic and atraumatic.  Neck: Neck supple.  Cardiovascular: Normal rate, regular rhythm and normal heart sounds.   Pulmonary/Chest: Effort normal and breath sounds normal. No respiratory distress. She has no wheezes.  Abdominal: Soft. Bowel sounds are normal. She exhibits no distension. There is no tenderness. There is no rebound.  Genitourinary:  Normal external genitalia. Normal vaginal canal. Large yellow discharge. Cervix is normal, closed. CMT present. No uterine or adnexal tenderness. No masses palpated.    Neurological: She is alert and oriented to person, place, and time.  Skin: Skin is warm and dry.  Psychiatric: She has a normal mood and affect.  Nursing note and vitals reviewed.   ED Course  Procedures (including critical care time) DIAGNOSTIC STUDIES: Oxygen Saturation is 97% on RA, normal by my interpretation.    COORDINATION OF CARE: 2:33 PM- Pt advised of plan for treatment and pt agrees. Pt will receive  a pelvic examination and lab work for further evaluation.    Labs Review Labs Reviewed  WET PREP, GENITAL  URINALYSIS, ROUTINE W REFLEX MICROSCOPIC (NOT AT [facility])  RAPID HIV SCREEN (HIV 1/2 AB+AG)  RPR  POC URINE PREG, ED  GC/CHLAMYDIA PROBE AMP ([place]) NOT AT [facility]   Jeannett Senior, PA-C has personally reviewed and evaluated these lab results as part of her medical decision-making.  MDM   Final diagnoses:  PID (acute pelvic inflammatory disease)  Polysubstance  (excluding opioids) dependence ([place])   Patient in emergency department complaining of back pain and vaginal discharge. She reports history of pyelonephritis. She appears to be highly intoxicated. She admitted to triage nurse she just did cocaine prior to coming in. She denies taking anything to me, however has extensive  polysubstance abuse history. Will monitor for her to sober. Will do pelvic exam.    5:22 PM Pt sleeping. arousable but falls back asleep. Treated with rocephin 250mg  IM and zithromax 1g po for infection. UA with only trace leuks and rare bacteria.  Doubt uti, will send cultures  8:37 PM Pt more awake, still appears under influence. Able toambulate with assistance. Spoke with family, will come and get pt.   Pt left with oncoming provider at time of shift change pending family arrival.   Filed Vitals:   02/04/16 1515 02/04/16 1825 02/04/16 1926 02/04/16 2211  BP:  99/36 121/55 123/48  Pulse: 95 60  92  Temp:      Resp: 22 18  18   SpO2: 98% 100%  98%     Jeannett Senior, PA-C 02/06/16 0037  Varney Biles, MD 02/06/16 [phone]

## 2016-02-04 NOTE — ED Provider Notes (Signed)
11:51 PM Pt evaluated and patient alert and oriented 4 this time. Patient be given a bus pass and stable for discharge  Lacretia Leigh, MD 02/04/16 2352

## 2016-02-04 NOTE — ED Notes (Addendum)
PA at bedside to do pelvic exam and obtain urine specimen via in and out cath. Pt tolerated well.

## 2016-02-04 NOTE — ED Notes (Signed)
PT REFUSES TO STAY STILL WHILE TRYING TO COLLECT SAMPLES TWO ATTEMPT'S MADE

## 2016-02-04 NOTE — ED Notes (Addendum)
Attempted to call Adelbert, emergency contact and informed him of pt needing transportation at discharge. He reported that he will come get her when she is ready.

## 2016-02-05 LAB — GC/CHLAMYDIA PROBE AMP (~~LOC~~) NOT AT ARMC
CHLAMYDIA, DNA PROBE: NEGATIVE
Neisseria Gonorrhea: NEGATIVE

## 2016-02-05 LAB — URINE CULTURE: Culture: NO GROWTH

## 2016-02-06 LAB — RPR: RPR Ser Ql: NONREACTIVE

## 2016-02-19 ENCOUNTER — Emergency Department (HOSPITAL_COMMUNITY)
Admission: EM | Admit: 2016-02-19 | Discharge: 2016-02-19 | Disposition: A | Discharge status: Home / Self Care | Payer: Non-veteran care | Attending: Emergency Medicine | Admitting: Emergency Medicine

## 2016-02-19 ENCOUNTER — Encounter (HOSPITAL_COMMUNITY): Payer: Self-pay | Admitting: Emergency Medicine

## 2016-02-19 DIAGNOSIS — G8929 Other chronic pain: Secondary | ICD-10-CM | POA: Insufficient documentation

## 2016-02-19 DIAGNOSIS — T401X1A Poisoning by heroin, accidental (unintentional), initial encounter: Secondary | ICD-10-CM | POA: Diagnosis not present

## 2016-02-19 DIAGNOSIS — R2240 Localized swelling, mass and lump, unspecified lower limb: Secondary | ICD-10-CM | POA: Insufficient documentation

## 2016-02-19 DIAGNOSIS — Y9289 Other specified places as the place of occurrence of the external cause: Secondary | ICD-10-CM | POA: Insufficient documentation

## 2016-02-19 DIAGNOSIS — T405X1A Poisoning by cocaine, accidental (unintentional), initial encounter: Secondary | ICD-10-CM | POA: Diagnosis present

## 2016-02-19 DIAGNOSIS — F17210 Nicotine dependence, cigarettes, uncomplicated: Secondary | ICD-10-CM | POA: Diagnosis not present

## 2016-02-19 DIAGNOSIS — Y998 Other external cause status: Secondary | ICD-10-CM | POA: Insufficient documentation

## 2016-02-19 DIAGNOSIS — R2230 Localized swelling, mass and lump, unspecified upper limb: Secondary | ICD-10-CM | POA: Diagnosis not present

## 2016-02-19 DIAGNOSIS — Y9389 Activity, other specified: Secondary | ICD-10-CM | POA: Insufficient documentation

## 2016-02-19 NOTE — ED Notes (Signed)
Pt states she has been using crack, molly, heroin, coke.  Pt cannot sit still.

## 2016-02-19 NOTE — ED Notes (Signed)
Pt presents via EMS with c/o ingestion of crack cocaine and heroin. Pt does not want any detox procedures done for her drug use but only wants to be seen for the swelling in her hands and feet.

## 2016-02-19 NOTE — ED Notes (Signed)
Writer called for room assignment no response 

## 2017-07-01 DIAGNOSIS — R768 Other specified abnormal immunological findings in serum: Secondary | ICD-10-CM

## 2017-07-01 DIAGNOSIS — K838 Other specified diseases of biliary tract: Secondary | ICD-10-CM

## 2017-07-01 HISTORY — DX: Other specified diseases of biliary tract: K83.8

## 2017-07-01 HISTORY — DX: Other specified abnormal immunological findings in serum: R76.8

## 2017-07-15 ENCOUNTER — Inpatient Hospital Stay (HOSPITAL_COMMUNITY)
Admission: EM | Admit: 2017-07-15 | Discharge: 2017-07-20 | DRG: 896 | Disposition: A | Discharge status: Home / Self Care | Payer: Non-veteran care | Attending: Nephrology | Admitting: Nephrology

## 2017-07-15 ENCOUNTER — Emergency Department (HOSPITAL_COMMUNITY): Payer: Non-veteran care

## 2017-07-15 ENCOUNTER — Encounter (HOSPITAL_COMMUNITY): Payer: Self-pay

## 2017-07-15 ENCOUNTER — Inpatient Hospital Stay (HOSPITAL_COMMUNITY): Payer: Non-veteran care

## 2017-07-15 DIAGNOSIS — G92 Toxic encephalopathy: Secondary | ICD-10-CM | POA: Diagnosis present

## 2017-07-15 DIAGNOSIS — R945 Abnormal results of liver function studies: Secondary | ICD-10-CM | POA: Diagnosis not present

## 2017-07-15 DIAGNOSIS — F19129 Other psychoactive substance abuse with intoxication, unspecified: Secondary | ICD-10-CM | POA: Diagnosis present

## 2017-07-15 DIAGNOSIS — R740 Nonspecific elevation of levels of transaminase and lactic acid dehydrogenase [LDH]: Secondary | ICD-10-CM | POA: Diagnosis present

## 2017-07-15 DIAGNOSIS — K7689 Other specified diseases of liver: Secondary | ICD-10-CM | POA: Diagnosis not present

## 2017-07-15 DIAGNOSIS — F4310 Post-traumatic stress disorder, unspecified: Secondary | ICD-10-CM | POA: Diagnosis present

## 2017-07-15 DIAGNOSIS — R339 Retention of urine, unspecified: Secondary | ICD-10-CM | POA: Diagnosis not present

## 2017-07-15 DIAGNOSIS — Z23 Encounter for immunization: Secondary | ICD-10-CM

## 2017-07-15 DIAGNOSIS — K831 Obstruction of bile duct: Secondary | ICD-10-CM | POA: Diagnosis present

## 2017-07-15 DIAGNOSIS — K839 Disease of biliary tract, unspecified: Secondary | ICD-10-CM | POA: Diagnosis not present

## 2017-07-15 DIAGNOSIS — B1920 Unspecified viral hepatitis C without hepatic coma: Secondary | ICD-10-CM | POA: Diagnosis present

## 2017-07-15 DIAGNOSIS — N179 Acute kidney failure, unspecified: Secondary | ICD-10-CM | POA: Diagnosis present

## 2017-07-15 DIAGNOSIS — F17210 Nicotine dependence, cigarettes, uncomplicated: Secondary | ICD-10-CM | POA: Diagnosis present

## 2017-07-15 DIAGNOSIS — M6282 Rhabdomyolysis: Secondary | ICD-10-CM | POA: Diagnosis present

## 2017-07-15 DIAGNOSIS — K838 Other specified diseases of biliary tract: Secondary | ICD-10-CM | POA: Diagnosis not present

## 2017-07-15 DIAGNOSIS — E876 Hypokalemia: Secondary | ICD-10-CM

## 2017-07-15 DIAGNOSIS — F19921 Other psychoactive substance use, unspecified with intoxication with delirium: Secondary | ICD-10-CM

## 2017-07-15 DIAGNOSIS — T65891S Toxic effect of other specified substances, accidental (unintentional), sequela: Secondary | ICD-10-CM | POA: Diagnosis not present

## 2017-07-15 DIAGNOSIS — F1490 Cocaine use, unspecified, uncomplicated: Secondary | ICD-10-CM | POA: Diagnosis not present

## 2017-07-15 DIAGNOSIS — F1910 Other psychoactive substance abuse, uncomplicated: Secondary | ICD-10-CM

## 2017-07-15 DIAGNOSIS — R748 Abnormal levels of other serum enzymes: Secondary | ICD-10-CM | POA: Diagnosis present

## 2017-07-15 DIAGNOSIS — R7989 Other specified abnormal findings of blood chemistry: Secondary | ICD-10-CM

## 2017-07-15 DIAGNOSIS — K834 Spasm of sphincter of Oddi: Secondary | ICD-10-CM | POA: Diagnosis not present

## 2017-07-15 HISTORY — DX: Other specified abnormal immunological findings in serum: R76.8

## 2017-07-15 HISTORY — DX: Other specified diseases of biliary tract: K83.8

## 2017-07-15 HISTORY — DX: Other psychoactive substance abuse, uncomplicated: F19.10

## 2017-07-15 HISTORY — DX: Other specified anxiety disorders: F41.8

## 2017-07-15 LAB — CK: Total CK: 3790 U/L — ABNORMAL HIGH (ref 38–234)

## 2017-07-15 LAB — URINALYSIS, ROUTINE W REFLEX MICROSCOPIC
Bacteria, UA: NONE SEEN
Glucose, UA: NEGATIVE mg/dL
Hgb urine dipstick: NEGATIVE
KETONES UR: 5 mg/dL — AB
Leukocytes, UA: NEGATIVE
NITRITE: NEGATIVE
PROTEIN: 30 mg/dL — AB
Specific Gravity, Urine: 1.03 (ref 1.005–1.030)
pH: 5 (ref 5.0–8.0)

## 2017-07-15 LAB — CBC WITH DIFFERENTIAL/PLATELET
BASOS ABS: 0 10*3/uL (ref 0.0–0.1)
BASOS PCT: 0 %
Eosinophils Absolute: 0.2 10*3/uL (ref 0.0–0.7)
Eosinophils Relative: 2 %
HEMATOCRIT: 32.8 % — AB (ref 36.0–46.0)
Hemoglobin: 11.5 g/dL — ABNORMAL LOW (ref 12.0–15.0)
Lymphocytes Relative: 28 %
Lymphs Abs: 1.9 10*3/uL (ref 0.7–4.0)
MCH: 30.1 pg (ref 26.0–34.0)
MCHC: 35.1 g/dL (ref 30.0–36.0)
MCV: 85.9 fL (ref 78.0–100.0)
Monocytes Absolute: 0.5 10*3/uL (ref 0.1–1.0)
Monocytes Relative: 7 %
NEUTROS ABS: 4.3 10*3/uL (ref 1.7–7.7)
NEUTROS PCT: 63 %
Platelets: 339 10*3/uL (ref 150–400)
RBC: 3.82 MIL/uL — AB (ref 3.87–5.11)
RDW: 13.1 % (ref 11.5–15.5)
WBC: 6.9 10*3/uL (ref 4.0–10.5)

## 2017-07-15 LAB — COMPREHENSIVE METABOLIC PANEL
ALBUMIN: 3.6 g/dL (ref 3.5–5.0)
ALK PHOS: 32 U/L — AB (ref 38–126)
ALT: 58 U/L — ABNORMAL HIGH (ref 14–54)
AST: 135 U/L — AB (ref 15–41)
Anion gap: 9 (ref 5–15)
BILIRUBIN TOTAL: 1.4 mg/dL — AB (ref 0.3–1.2)
BUN: 9 mg/dL (ref 6–20)
CALCIUM: 8.2 mg/dL — AB (ref 8.9–10.3)
CO2: 21 mmol/L — ABNORMAL LOW (ref 22–32)
Chloride: 107 mmol/L (ref 101–111)
Creatinine, Ser: 1.15 mg/dL — ABNORMAL HIGH (ref 0.44–1.00)
GFR calc Af Amer: >60 mL/min (ref >60)
GFR calc non Af Amer: >60 mL/min (ref >60)
GLUCOSE: 94 mg/dL (ref 65–99)
Potassium: 3.1 mmol/L — ABNORMAL LOW (ref 3.5–5.1)
Sodium: 137 mmol/L (ref 135–145)
TOTAL PROTEIN: 6.4 g/dL — AB (ref 6.5–8.1)

## 2017-07-15 LAB — I-STAT BETA HCG BLOOD, ED (MC, WL, AP ONLY)

## 2017-07-15 LAB — RAPID URINE DRUG SCREEN, HOSP PERFORMED
Amphetamines: NOT DETECTED
BARBITURATES: NOT DETECTED
Benzodiazepines: POSITIVE — AB
COCAINE: POSITIVE — AB
Opiates: POSITIVE — AB
Tetrahydrocannabinol: NOT DETECTED

## 2017-07-15 LAB — ETHANOL: Alcohol, Ethyl (B): <5 mg/dL (ref <5)

## 2017-07-15 LAB — CBG MONITORING, ED: Glucose-Capillary: 95 mg/dL (ref 65–99)

## 2017-07-15 LAB — GLUCOSE, CAPILLARY: GLUCOSE-CAPILLARY: 99 mg/dL (ref 65–99)

## 2017-07-15 LAB — HIV ANTIBODY (ROUTINE TESTING W REFLEX): HIV SCREEN 4TH GENERATION: NONREACTIVE

## 2017-07-15 LAB — SALICYLATE LEVEL: Salicylate Lvl: <7 mg/dL (ref 2.8–30.0)

## 2017-07-15 LAB — ACETAMINOPHEN LEVEL: Acetaminophen (Tylenol), Serum: <10 ug/mL — ABNORMAL LOW (ref 10–30)

## 2017-07-15 MED ORDER — FOLIC ACID 5 MG/ML IJ SOLN
1.0000 mg | Freq: Every day | INTRAMUSCULAR | Status: DC
  Administered 2017-07-15: 1 mg via INTRAVENOUS
  Filled 2017-07-15 (×2): qty 0.2

## 2017-07-15 MED ORDER — POTASSIUM CHLORIDE IN NACL 40-0.9 MEQ/L-% IV SOLN
INTRAVENOUS | Status: DC
  Administered 2017-07-15 – 2017-07-16 (×2): 125 mL/h via INTRAVENOUS
  Administered 2017-07-16: 75 mL/h via INTRAVENOUS
  Filled 2017-07-15 (×6): qty 1000

## 2017-07-15 MED ORDER — ENOXAPARIN SODIUM 40 MG/0.4ML ~~LOC~~ SOLN
40.0000 mg | SUBCUTANEOUS | Status: DC
  Administered 2017-07-15 – 2017-07-19 (×5): 40 mg via SUBCUTANEOUS
  Filled 2017-07-15 (×6): qty 0.4

## 2017-07-15 MED ORDER — LORAZEPAM 2 MG/ML IJ SOLN
1.0000 mg | Freq: Once | INTRAMUSCULAR | Status: AC
  Administered 2017-07-15: 1 mg via INTRAVENOUS

## 2017-07-15 MED ORDER — LORAZEPAM 2 MG/ML IJ SOLN: 1.0000 mg | INTRAMUSCULAR | Status: DC | PRN

## 2017-07-15 MED ORDER — SODIUM CHLORIDE 0.9 % IV SOLN
INTRAVENOUS | Status: DC
  Administered 2017-07-15 (×2): via INTRAVENOUS

## 2017-07-15 MED ORDER — SODIUM CHLORIDE 0.9 % IV BOLUS (SEPSIS)
1000.0000 mL | Freq: Once | INTRAVENOUS | Status: AC
  Administered 2017-07-15: 1000 mL via INTRAVENOUS

## 2017-07-15 MED ORDER — FOLIC ACID 1 MG PO TABS
1.0000 mg | ORAL_TABLET | Freq: Every day | ORAL | Status: DC
  Filled 2017-07-15: qty 1

## 2017-07-15 MED ORDER — THIAMINE HCL 100 MG/ML IJ SOLN
100.0000 mg | Freq: Every day | INTRAMUSCULAR | Status: DC
  Administered 2017-07-15: 100 mg via INTRAVENOUS
  Filled 2017-07-15: qty 2

## 2017-07-15 MED ORDER — POTASSIUM CHLORIDE 10 MEQ/100ML IV SOLN
10.0000 meq | INTRAVENOUS | Status: AC
  Administered 2017-07-15 (×3): 10 meq via INTRAVENOUS
  Filled 2017-07-15 (×3): qty 100

## 2017-07-15 NOTE — H&P (Signed)
TRH H&P   Patient Demographics:    [name], is a 31 y.o. female  MRN: [id number]   DOB - February 04, 1986  Admit Date - 07/15/2017  Outpatient Primary MD for the patient is Patient, No Pcp Per  Referring MD/NP/PA:  Christy Gentles  Outpatient Specialists:   Patient coming from: home  Chief Complaint  Patient presents with  . Altered Mental Status      HPI:    [name]  is a 31 y.o. female, w PTSD, polysubstance abuse who apparently was pick up from friends house around [place] last nite and started to act bizaar.  Pt last used heroin and crack coccaine 8/14.   In ED, pt given 2.5mg  of midazolam.  UDS + for coccaine, opiates, benzodiazepine. etoh <5,  CPK 3,790 Bun/creat   9/1.15, K=3.1, Ast 135, Alt 58.   Pt will be admitted for rhabdomyolysis.      Review of systems:    In addition to the HPI above,  No Fever-chills, No Headache, No changes with Vision or hearing, No problems swallowing food or Liquids, No Chest pain, Cough or Shortness of Breath, No Abdominal pain, No Nausea or Vommitting, Bowel movements are regular, No Blood in stool or Urine, No dysuria, No new skin rashes or bruises, No new joints pains-aches,  No new weakness, tingling, numbness in any extremity, No recent weight gain or loss, No polyuria, polydypsia or polyphagia, No significant Mental Stressors.  A full 10 point Review of Systems was done, except as stated above, all other Review of Systems were negative.   With Past History of the following :    Past Medical History:  Diagnosis Date  . Polysubstance abuse   . PTSD (post-traumatic stress disorder)       Past Surgical History:  Procedure Laterality Date  . APPENDECTOMY    . TUBAL LIGATION        Social History:     Social History  Substance Use Topics  . Smoking status: Current Every Day Smoker    Packs/day: 1.00    Types:  Cigarettes  . Smokeless tobacco: Never Used  . Alcohol use Not on file     Lives - at home  Mobility - walks by self    Family History :     Family History  Problem Relation Age of Onset  . Family history unknown: Yes     Home Medications:   Prior to Admission medications   Not on File     Allergies:    No Known Allergies   Physical Exam:   Vitals  Blood pressure 128/75, pulse 81, temperature 100 F (37.8 C), temperature source Rectal, resp. rate 19, height 5\' 8"  (1.727 m), weight 99.8 kg (220 lb), SpO2 97 %.   1. General  lying in bed in NAD,   2. Normal affect and insight, Not Suicidal or Homicidal, Awake  Alert, Oriented X 1  3. No F.N deficits, ALL C.Nerves Intact, Strength 5/5 all 4 extremities, Sensation intact all 4 extremities, Plantars down going.  4. Ears and Eyes appear Normal, Conjunctivae clear, PERRLA. Moist Oral Mucosa.  5. Supple Neck, No JVD, No cervical lymphadenopathy appriciated, No Carotid Bruits.  6. Symmetrical Chest wall movement, Good air movement bilaterally, CTAB.  7. RRR, No Gallops, Rubs or Murmurs, No Parasternal Heave.  8. Positive Bowel Sounds, Abdomen Soft, No tenderness, No organomegaly appriciated,No rebound -guarding or rigidity.  9.  No Cyanosis, Normal Skin Turgor, No Skin Rash or Bruise.  10. Good muscle tone,  joints appear normal , no effusions, Normal ROM.  11. No Palpable Lymph Nodes in Neck or Axillae     Data Review:    CBC  Recent Labs Lab 07/15/17 0330  WBC 6.9  HGB 11.5*  HCT 32.8*  PLT 339  MCV 85.9  MCH 30.1  MCHC 35.1  RDW 13.1  LYMPHSABS 1.9  MONOABS 0.5  EOSABS 0.2  BASOSABS 0.0   ------------------------------------------------------------------------------------------------------------------  Chemistries   Recent Labs Lab 07/15/17 0115  NA 137  K 3.1*  CL 107  CO2 21*  GLUCOSE 94  BUN 9  CREATININE 1.15*  CALCIUM 8.2*  AST 135*  ALT 58*  ALKPHOS 32*  BILITOT 1.4*    ------------------------------------------------------------------------------------------------------------------ estimated creatinine clearance is 86.8 mL/min (A) (by C-G formula based on SCr of 1.15 mg/dL (H)). ------------------------------------------------------------------------------------------------------------------ No results for input(s): TSH, T4TOTAL, T3FREE, THYROIDAB in the last 72 hours.  Invalid input(s): FREET3  Coagulation profile No results for input(s): INR, PROTIME in the last 168 hours. ------------------------------------------------------------------------------------------------------------------- No results for input(s): DDIMER in the last 72 hours. -------------------------------------------------------------------------------------------------------------------  Cardiac Enzymes No results for input(s): CKMB, TROPONINI, MYOGLOBIN in the last 168 hours.  Invalid input(s): CK ------------------------------------------------------------------------------------------------------------------ No results found for: BNP   ---------------------------------------------------------------------------------------------------------------  Urinalysis    Component Value Date/Time   COLORURINE Len (A) 07/15/2017 0135   APPEARANCEUR CLEAR 07/15/2017 0135   LABSPEC 1.030 07/15/2017 0135   PHURINE 5.0 07/15/2017 0135   GLUCOSEU NEGATIVE 07/15/2017 0135   HGBUR NEGATIVE 07/15/2017 0135   BILIRUBINUR SMALL (A) 07/15/2017 0135   KETONESUR 5 (A) 07/15/2017 0135   PROTEINUR 30 (A) 07/15/2017 0135   NITRITE NEGATIVE 07/15/2017 0135   LEUKOCYTESUR NEGATIVE 07/15/2017 0135    ----------------------------------------------------------------------------------------------------------------   Imaging Results:    Ct Head Wo Contrast  Result Date: 07/15/2017 CLINICAL DATA:  Bizarre behavior beginning last night, pinpoint pupils. Last used heroin and crack cocaine this  morning. EXAM: CT HEAD WITHOUT CONTRAST TECHNIQUE: Contiguous axial images were obtained from the base of the skull through the vertex without intravenous contrast. COMPARISON:  None. FINDINGS: BRAIN: No intraparenchymal hemorrhage, mass effect nor midline shift. The ventricles and sulci are normal. No acute large vascular territory infarcts. No abnormal extra-axial fluid collections. Punctate anterior falx calcifications. Basal cisterns are patent. VASCULAR: Unremarkable. SKULL/SOFT TISSUES: No skull fracture. No significant soft tissue swelling. ORBITS/SINUSES: The included ocular globes and orbital contents are normal.Trace paranasal sinus mucosal thickening without air-fluid levels. Mastoid air cells are well aerated. Mastoid aircells and included paranasal sinuses are well-aerated. OTHER: None. IMPRESSION: Normal noncontrast CT HEAD. Electronically Signed   By: Elon Alas M.D.   On: 07/15/2017 02:19     Assessment & Plan:    Principal Problem:   Rhabdomyolysis Active Problems:   Hypokalemia   Abnormal liver function    Rhabdo NS iv Check cpk in am  Hypokalemia Replete Check cmp  in am  Abnormal liver function Check acute hepatitis panel Check RUQ ultrasound  Polysubstance abuse CIWA   PTSD Will need outpatient therapy   DVT Prophylaxis  Lovenox - SCDs  AM Labs Ordered, also please review Full Orders  Family Communication: Admission, patients condition and plan of care including tests being ordered have been discussed with the patient who indicate understanding and agree with the plan and Code Status.  Code Status FULL CODE  Likely DC to  home  Condition GUARDED  Consults called: none  Admission status: inpatient   Time spent in minutes : 45   Jani Gravel M.D on 07/15/2017 at 4:50 AM  Between 7am to 7pm - Pager - [phone]. After 7pm go to [email or web address] - password [facility]  [facility] - Office  [phone]

## 2017-07-15 NOTE — ED Provider Notes (Signed)
[facility] DEPT Provider Note   CSN: [id number] Arrival date & time: 07/15/17  0101     History   Chief Complaint Chief Complaint - altered mental status  LEVEL 5 CAVEAT DUE TO ALTERED MENTAL STATUS   HPI [name] is a 31 y.o. female.  The history is provided by the EMS personnel. The history is limited by the condition of the patient.  Altered Mental Status   This is a new problem. Episode onset: UNKNOWN. The problem has been rapidly worsening. Associated symptoms include confusion and agitation. Risk factors include illicit drug use.   Patient presents via EMS for altered mental status Per EMS, pt has h/o PTSD and "just got out of rehab" Family called EMS due to concern for behavior and potential use of heroin and crack cocaine  EMS gave patient midazolam CBG>100 No other details are known  PMH - PTSD Soc hx - reported drug abuse per EMS OB History    No data available       Home Medications    Prior to Admission medications   Not on File    Family History No family history on file.  Social History Social History  Substance Use Topics  . Smoking status: Not on file  . Smokeless tobacco: Not on file  . Alcohol use Not on file     Allergies   Patient has no allergy information on record.   Review of Systems Review of Systems  Unable to perform ROS: Mental status change  Psychiatric/Behavioral: Positive for agitation and confusion.     Physical Exam Updated Vital Signs BP (!) 112/45   Pulse (!) 102   Temp 100 F (37.8 C) (Rectal)   Resp 17   Ht 1.727 m (5\' 8" )   Wt 99.8 kg (220 lb)   LMP  (LMP Unknown)   SpO2 97%   BMI 33.45 kg/m   Physical Exam CONSTITUTIONAL: patient is altered, writhing around in bed, yelling HEAD: Normocephalic/atraumatic EYES: pupils pinpoint but reactive, no nystagmus ENMT: Mucous membranes moist NECK: supple no meningeal signs CV: S1/S2 noted, no murmurs/rubs/gallops noted LUNGS: Lungs are clear to  auscultation bilaterally, no apparent distress ABDOMEN: soft, nondistended NEURO: Pt is yelling and writhing around in bed.  She moves all extremities.  She does not respond to voice EXTREMITIES: pulses normal/equal, full ROM SKIN: warm, color normal, right AC fossa reveals evidence of recent IVDA, no abscess/cellulitis PSYCH:unable to assess  ED Treatments / Results  Labs (all labs ordered are listed, but only abnormal results are displayed) Labs Reviewed  COMPREHENSIVE METABOLIC PANEL - Abnormal; Notable for the following:       Result Value   Potassium 3.1 (*)    CO2 21 (*)    Creatinine, Ser 1.15 (*)    Calcium 8.2 (*)    Total Protein 6.4 (*)    AST 135 (*)    ALT 58 (*)    Alkaline Phosphatase 32 (*)    Total Bilirubin 1.4 (*)    All other components within normal limits  ACETAMINOPHEN LEVEL - Abnormal; Notable for the following:    Acetaminophen (Tylenol), Serum <10 (*)    All other components within normal limits  RAPID URINE DRUG SCREEN, HOSP PERFORMED - Abnormal; Notable for the following:    Opiates POSITIVE (*)    Cocaine POSITIVE (*)    Benzodiazepines POSITIVE (*)    All other components within normal limits  CK - Abnormal; Notable for the following:    Total  CK 3,790 (*)    All other components within normal limits  URINALYSIS, ROUTINE W REFLEX MICROSCOPIC - Abnormal; Notable for the following:    Color, Urine Callista (*)    Bilirubin Urine SMALL (*)    Ketones, ur 5 (*)    Protein, ur 30 (*)    Squamous Epithelial / LPF 0-5 (*)    All other components within normal limits  CBC WITH DIFFERENTIAL/PLATELET - Abnormal; Notable for the following:    RBC 3.82 (*)    Hemoglobin 11.5 (*)    HCT 32.8 (*)    All other components within normal limits  ETHANOL  SALICYLATE LEVEL  CBC WITH DIFFERENTIAL/PLATELET  I-STAT BETA HCG BLOOD, ED ([facility], [facility], [facility] ONLY)  CBG MONITORING, ED    EKG  EKG Interpretation  Date/Time:  Wednesday July 15 2017 01:07:23  EDT Ventricular Rate:  104 PR Interval:    QRS Duration: 86 QT Interval:  359 QTC Calculation: 473 R Axis:   71 Text Interpretation:  Sinus tachycardia Abnormal ekg No previous ECGs available Confirmed by Ripley Fraise [phone]) on 07/15/2017 1:33:43 AM       Radiology Ct Head Wo Contrast  Result Date: 07/15/2017 CLINICAL DATA:  Bizarre behavior beginning last night, pinpoint pupils. Last used heroin and crack cocaine this morning. EXAM: CT HEAD WITHOUT CONTRAST TECHNIQUE: Contiguous axial images were obtained from the base of the skull through the vertex without intravenous contrast. COMPARISON:  None. FINDINGS: BRAIN: No intraparenchymal hemorrhage, mass effect nor midline shift. The ventricles and sulci are normal. No acute large vascular territory infarcts. No abnormal extra-axial fluid collections. Punctate anterior falx calcifications. Basal cisterns are patent. VASCULAR: Unremarkable. SKULL/SOFT TISSUES: No skull fracture. No significant soft tissue swelling. ORBITS/SINUSES: The included ocular globes and orbital contents are normal.Trace paranasal sinus mucosal thickening without air-fluid levels. Mastoid air cells are well aerated. Mastoid aircells and included paranasal sinuses are well-aerated. OTHER: None. IMPRESSION: Normal noncontrast CT HEAD. Electronically Signed   By: Elon Alas M.D.   On: 07/15/2017 02:19    Procedures Procedures  CRITICAL CARE Performed by: Sharyon Cable Total critical care time: 40 minutes Critical care time was exclusive of separately billable procedures and treating other patients. Critical care was necessary to treat or prevent imminent or life-threatening deterioration. Critical care was time spent personally by me on the following activities: development of treatment plan with patient and/or surrogate as well as nursing, discussions with consultants, evaluation of patient's response to treatment, examination of patient, obtaining history  from patient or surrogate, ordering and performing treatments and interventions, ordering and review of laboratory studies, ordering and review of radiographic studies, pulse oximetry and re-evaluation of patient's condition. PATIENT WITH DRUG DELIRIUM REQUIRING RE-EVALUATION, IV FLUIDS, IV ATIVAN AND ADMISSION   Medications Ordered in ED Medications  LORazepam (ATIVAN) injection 1 mg (not administered)  LORazepam (ATIVAN) injection 1 mg (1 mg Intravenous Given 07/15/17 0122)  sodium chloride 0.9 % bolus 1,000 mL (1,000 mLs Intravenous New Bag/Given 07/15/17 0356)  sodium chloride 0.9 % bolus 1,000 mL (1,000 mLs Intravenous New Bag/Given 07/15/17 0355)     Initial Impression / Assessment and Plan / ED Course  I have reviewed the triage vital signs and the nursing notes.  Pertinent labs & imaging results that were available during my care of the patient were reviewed by me and considered in my medical decision making (see chart for details).     1:16 AM Pt seen on arrival for AMS, suspect drug intoxication Labs/imaging  ordered Will follow closely 2:13 AM I spoke to Father He reports pt just got out of rehab, was supposed to go to [place] in [place] for rehab Since being back in [place] this past week she has been using drugs and he is certain she has been using heroin/cocaine 4:23 AM PT FOUND TO BE IN RHABDOMYOLYSIS, LIKELY DUE TO DRUG ABUSE SHE IS NOW RESTING COMFORTABLY SUSPECT TONIGHT'S EPISODE DUE TO DRUG INTOXICATION/DELIRIUM I HAVE LOW SUSPICION FOR MENINGITIS WILL ADMIT D/W DR Pettus  Final Clinical Impressions(s) / ED Diagnoses   Final diagnoses:  Polysubstance abuse  Drug intoxication with delirium ([place])  Non-traumatic rhabdomyolysis    New Prescriptions New Prescriptions   No medications on file     Ripley Fraise, MD 07/15/17 [phone]

## 2017-07-15 NOTE — ED Notes (Signed)
Christia Reading (Dad)  [phone].- would like to be notified of updates

## 2017-07-15 NOTE — ED Triage Notes (Signed)
BIB [facility] from home. Pt was picked up from friends house around 8pm last night started to have bizzar behavior twicting. EMS arrived and pick up pt. Pt was axox4 on scene. Pupils were pin point. Pt last use of heroin, crack cocaine was this morning. Pt has hx of PTSD. Pt started to have unwanted movement pt was given 2.5mg  of midazolam.

## 2017-07-15 NOTE — Progress Notes (Signed)
Pt arrived to [facility], transferred from stretcher to bed. No family at the bedside. Pt does not talk, not answering questions, lethargic. Wakes up frequently or with any interactions. When awake pt agitated, and screams. VS stable, no signs of acute distress. Cardiac monitor placed as well as bed rail pads.  Pt identified, RN unable to obtain history or education. Order for safety sitter placed. Will continue to monitor and treat pt per MD orders.

## 2017-07-15 NOTE — Progress Notes (Addendum)
The patient was seen and examined at bedside. She was admitted early today. Please see H&P by Dr. Maudie Mercury for detail.  31 year old female with history of polysubstance abuse, PTSD admitted for bizarre behavior and altered  mental status. U tox positive for cocaine, opiates and benzodiazepine. Patient with nontraumatic rhabdomyolysis and hypokalemia. Changed to IV fluid to address with potassium chloride. Repeat lab in the morning. Patient was alert awake but somnolent this morning. Patient's father at bedside. Continue supportive care. Social worker consulted because of substance abuse.  Ultrasound of abdomen with dilated common bile duct to 11 mm. I'll order MRCP. May need GI evaluation.  Continue vitamins and DVT prophylaxis with Lovenox subcutaneous.  Straight cath for acute urinary retention. Bladder scan

## 2017-07-15 NOTE — ED Notes (Signed)
Belongings placed in pod F locker 4

## 2017-07-15 NOTE — ED Notes (Signed)
Received call from tiffany in lab, CBC clotted, will order redraw.

## 2017-07-16 ENCOUNTER — Encounter (HOSPITAL_COMMUNITY): Payer: Self-pay | Admitting: General Practice

## 2017-07-16 ENCOUNTER — Inpatient Hospital Stay (HOSPITAL_COMMUNITY): Payer: Non-veteran care

## 2017-07-16 DIAGNOSIS — G92 Toxic encephalopathy: Secondary | ICD-10-CM

## 2017-07-16 DIAGNOSIS — F1490 Cocaine use, unspecified, uncomplicated: Secondary | ICD-10-CM

## 2017-07-16 DIAGNOSIS — F1910 Other psychoactive substance abuse, uncomplicated: Secondary | ICD-10-CM

## 2017-07-16 DIAGNOSIS — F19921 Other psychoactive substance use, unspecified with intoxication with delirium: Secondary | ICD-10-CM

## 2017-07-16 DIAGNOSIS — R740 Nonspecific elevation of levels of transaminase and lactic acid dehydrogenase [LDH]: Secondary | ICD-10-CM

## 2017-07-16 DIAGNOSIS — T65891S Toxic effect of other specified substances, accidental (unintentional), sequela: Secondary | ICD-10-CM

## 2017-07-16 LAB — COMPREHENSIVE METABOLIC PANEL
ALBUMIN: 3 g/dL — AB (ref 3.5–5.0)
ALT: 43 U/L (ref 14–54)
AST: 62 U/L — AB (ref 15–41)
Alkaline Phosphatase: 27 U/L — ABNORMAL LOW (ref 38–126)
Anion gap: 6 (ref 5–15)
CHLORIDE: 113 mmol/L — AB (ref 101–111)
CO2: 23 mmol/L (ref 22–32)
CREATININE: 0.82 mg/dL (ref 0.44–1.00)
Calcium: 7.7 mg/dL — ABNORMAL LOW (ref 8.9–10.3)
GFR calc Af Amer: >60 mL/min (ref >60)
GLUCOSE: 95 mg/dL (ref 65–99)
Potassium: 3.5 mmol/L (ref 3.5–5.1)
SODIUM: 142 mmol/L (ref 135–145)
Total Bilirubin: 0.8 mg/dL (ref 0.3–1.2)
Total Protein: 5.6 g/dL — ABNORMAL LOW (ref 6.5–8.1)

## 2017-07-16 LAB — CBC
HEMATOCRIT: 33.5 % — AB (ref 36.0–46.0)
Hemoglobin: 11.3 g/dL — ABNORMAL LOW (ref 12.0–15.0)
MCH: 30 pg (ref 26.0–34.0)
MCHC: 33.7 g/dL (ref 30.0–36.0)
MCV: 88.9 fL (ref 78.0–100.0)
PLATELETS: 325 10*3/uL (ref 150–400)
RBC: 3.77 MIL/uL — ABNORMAL LOW (ref 3.87–5.11)
RDW: 14.3 % (ref 11.5–15.5)
WBC: 4.8 10*3/uL (ref 4.0–10.5)

## 2017-07-16 LAB — CK TOTAL AND CKMB (NOT AT ARMC)
CK, MB: 6.5 ng/mL — ABNORMAL HIGH (ref 0.5–5.0)
Relative Index: 0.6 (ref 0.0–2.5)
Total CK: 1060 U/L — ABNORMAL HIGH (ref 38–234)

## 2017-07-16 LAB — HEPATITIS PANEL, ACUTE
HCV Ab: >11 s/co ratio — ABNORMAL HIGH (ref 0.0–0.9)
HEP A IGM: NEGATIVE
Hep B C IgM: NEGATIVE
Hepatitis B Surface Ag: NEGATIVE

## 2017-07-16 MED ORDER — FOLIC ACID 1 MG PO TABS
1.0000 mg | ORAL_TABLET | Freq: Every day | ORAL | Status: DC
  Administered 2017-07-16 – 2017-07-20 (×5): 1 mg via ORAL
  Filled 2017-07-16 (×5): qty 1

## 2017-07-16 MED ORDER — PNEUMOCOCCAL VAC POLYVALENT 25 MCG/0.5ML IJ INJ
0.5000 mL | INJECTION | INTRAMUSCULAR | Status: AC
  Administered 2017-07-18: 0.5 mL via INTRAMUSCULAR
  Filled 2017-07-16 (×2): qty 0.5

## 2017-07-16 MED ORDER — VITAMIN B-1 100 MG PO TABS
100.0000 mg | ORAL_TABLET | Freq: Every day | ORAL | Status: DC
  Administered 2017-07-16 – 2017-07-20 (×5): 100 mg via ORAL
  Filled 2017-07-16 (×5): qty 1

## 2017-07-16 MED ORDER — ACETAMINOPHEN 325 MG PO TABS
650.0000 mg | ORAL_TABLET | Freq: Four times a day (QID) | ORAL | Status: DC | PRN
  Administered 2017-07-16 – 2017-07-19 (×3): 650 mg via ORAL
  Filled 2017-07-16 (×3): qty 2

## 2017-07-16 MED ORDER — LORAZEPAM 1 MG PO TABS
1.0000 mg | ORAL_TABLET | ORAL | Status: DC | PRN
  Administered 2017-07-17 – 2017-07-19 (×3): 1 mg via ORAL
  Filled 2017-07-16 (×3): qty 1

## 2017-07-16 NOTE — Progress Notes (Addendum)
Pt unable to void and feeling pressure in lower abdomen. Order in for bladder scan. No order for in and out cauterization. Pt's bladder scan showed 321ml. NP on call notified. Will continue to monitor.

## 2017-07-16 NOTE — Progress Notes (Signed)
Patient assisted oob to bathroom to see if she can void , Patient unable to void but is passing gas. Patient assisted back to bed will continue to monitor.

## 2017-07-16 NOTE — Progress Notes (Signed)
PROGRESS NOTE    [name]  RSW:[id number] DOB: 23-May-1986 DOA: 07/15/2017 PCP: Patient, No Pcp Per   Brief Narrative: 31 year old female with history of polysubstance abuse, PTSD admitted for bizarre behavior and altered  mental status. Urine toxicology was positive for cocaine, opiates and benzodiazepine. Patient was found to have nontraumatic rhabdomyolysis and hypokalemia.  Assessment & Plan:  # Acute toxic encephalopathy in the setting of polysubstance abuse: Patient was alert awake and oriented today. Her mental status improved. No focal neurological deficit. CT head unremarkable. -Social worker consult -Patient was counseled  #Nontraumatic rhabdomyolysis in the setting of cocaine use: CK level trending down. I'll reduce the rate of IV fluid. Monitor labs.  # Hypokalemia: Repleted potassium chloride. Monitor labs  #Elevated liver enzymes/transaminitis and dilated common bile duct: Ultrasound of abdomen reviewed. CBD dilated to 11 mm. I ordered an MRCP for further evaluation.   #Acute urinary retention: Improved now.  #[name] antibody positive: Patient reported that she was known to have hep C antibody positive however viral study was negative in the [facility] hospital. I recommended patient to follow-up with her PCP and GI outpatient.  DVT prophylaxis: Lovenox subcutaneous Code Status: Full code Family Communication: Discussed with patient's father yesterday Disposition Plan: Likely discharge home in 1-2 days    Consultants:   None  Procedures: None Antimicrobials: None  Subjective: Seen and examined at bedside. Patient is more alert awake today. Denied headache, dizziness. Has mild epigastric pain. Able to urinate okay.  Objective: Vitals:   07/15/17 2007 07/15/17 2100 07/16/17 0500 07/16/17 0620  BP:  126/66  118/60  Pulse:  92  90  Resp:  18  16  Temp: 97.9 F (36.6 C) 99.5 F (37.5 C)  98.7 F (37.1 C)  TempSrc: Oral Oral    SpO2:  100%  96%  Weight:    110.1 kg (242 lb 11.6 oz)   Height:        Intake/Output Summary (Last 24 hours) at 07/16/17 1414 Last data filed at 07/16/17 0848  Gross per 24 hour  Intake          2977.08 ml  Output              727 ml  Net          2250.08 ml   Filed Weights   07/15/17 0121 07/15/17 0600 07/16/17 0500  Weight: 99.8 kg (220 lb) 107.4 kg (236 lb 12.4 oz) 110.1 kg (242 lb 11.6 oz)    Examination:  General exam: Appears calm and comfortable  Respiratory system: Clear to auscultation. Respiratory effort normal. No wheezing or crackle Cardiovascular system: S1 & S2 heard, RRR.  No pedal edema. Gastrointestinal system: Abdomen is nondistended, soft and nontender. Normal bowel sounds heard. Central nervous system: Alert and oriented. No focal neurological deficits. Extremities: Symmetric 5 x 5 power. Skin: No rashes, lesions or ulcers Psychiatry: Judgement and insight appear normal. Mood & affect appropriate.     Data Reviewed: I have personally reviewed following labs and imaging studies  CBC:  Recent Labs Lab 07/15/17 0330 07/16/17 0515  WBC 6.9 4.8  NEUTROABS 4.3  --   HGB 11.5* 11.3*  HCT 32.8* 33.5*  MCV 85.9 88.9  PLT 339 093   Basic Metabolic Panel:  Recent Labs Lab 07/15/17 0115 07/16/17 0515  NA 137 142  K 3.1* 3.5  CL 107 113*  CO2 21* 23  GLUCOSE 94 95  BUN 9 <5*  CREATININE 1.15* 0.82  CALCIUM 8.2*  7.7*   GFR: Estimated Creatinine Clearance: 128.1 mL/min (by C-G formula based on SCr of 0.82 mg/dL). Liver Function Tests:  Recent Labs Lab 07/15/17 0115 07/16/17 0515  AST 135* 62*  ALT 58* 43  ALKPHOS 32* 27*  BILITOT 1.4* 0.8  PROT 6.4* 5.6*  ALBUMIN 3.6 3.0*   No results for input(s): LIPASE, AMYLASE in the last 168 hours. No results for input(s): AMMONIA in the last 168 hours. Coagulation Profile: No results for input(s): INR, PROTIME in the last 168 hours. Cardiac Enzymes:  Recent Labs Lab 07/15/17 0115 07/16/17 0515  CKTOTAL 3,790* 1,060*    CKMB  --  6.5*   BNP (last 3 results) No results for input(s): PROBNP in the last 8760 hours. HbA1C: No results for input(s): HGBA1C in the last 72 hours. CBG:  Recent Labs Lab 07/15/17 0114 07/15/17 2309  GLUCAP 95 99   Lipid Profile: No results for input(s): CHOL, HDL, LDLCALC, TRIG, CHOLHDL, LDLDIRECT in the last 72 hours. Thyroid Function Tests: No results for input(s): TSH, T4TOTAL, FREET4, T3FREE, THYROIDAB in the last 72 hours. Anemia Panel: No results for input(s): VITAMINB12, FOLATE, FERRITIN, TIBC, IRON, RETICCTPCT in the last 72 hours. Sepsis Labs: No results for input(s): PROCALCITON, LATICACIDVEN in the last 168 hours.  No results found for this or any previous visit (from the past 240 hour(s)).       Radiology Studies: Ct Head Wo Contrast  Result Date: 07/15/2017 CLINICAL DATA:  Bizarre behavior beginning last night, pinpoint pupils. Last used heroin and crack cocaine this morning. EXAM: CT HEAD WITHOUT CONTRAST TECHNIQUE: Contiguous axial images were obtained from the base of the skull through the vertex without intravenous contrast. COMPARISON:  None. FINDINGS: BRAIN: No intraparenchymal hemorrhage, mass effect nor midline shift. The ventricles and sulci are normal. No acute large vascular territory infarcts. No abnormal extra-axial fluid collections. Punctate anterior falx calcifications. Basal cisterns are patent. VASCULAR: Unremarkable. SKULL/SOFT TISSUES: No skull fracture. No significant soft tissue swelling. ORBITS/SINUSES: The included ocular globes and orbital contents are normal.Trace paranasal sinus mucosal thickening without air-fluid levels. Mastoid air cells are well aerated. Mastoid aircells and included paranasal sinuses are well-aerated. OTHER: None. IMPRESSION: Normal noncontrast CT HEAD. Electronically Signed   By: Elon Alas M.D.   On: 07/15/2017 02:19   US Abdomen Limited Ruq  Result Date: 07/15/2017 CLINICAL DATA:  Abnormal LFTs. EXAM:  ULTRASOUND ABDOMEN LIMITED RIGHT UPPER QUADRANT COMPARISON:  None. FINDINGS: Gallbladder: No gallstones or wall thickening visualized. No sonographic Murphy sign noted by sonographer. Common bile duct: Diameter: 11 mm. Liver: No focal lesion identified. Heterogeneous in parenchymal echogenicity. IMPRESSION: 1. Dilated common bile duct, measuring 11 mm. Recommend further evaluation with MRCP or ERCP as clinically appropriate. 2. Heterogeneous liver echogenicity without focal lesion. Findings are nonspecific and can be seen with hepatitis. Electronically Signed   By: Titus Dubin M.D.   On: 07/15/2017 11:31        Scheduled Meds: . enoxaparin (LOVENOX) injection  40 mg Subcutaneous Q24H  . folic acid  1 mg Oral Daily  . [START ON 07/17/2017] pneumococcal 23 valent vaccine  0.5 mL Intramuscular Tomorrow-1000  . thiamine  100 mg Oral Daily   Continuous Infusions: . 0.9 % NaCl with KCl 40 mEq / L 125 mL/hr (07/16/17 0851)     LOS: 1 day    Lakeisha Waldrop Tanna Furry, MD [facility] Pager [phone]  If 7PM-7AM, please contact night-coverage [email or web address] Password [email or web address] 07/16/2017, 2:14 PM

## 2017-07-16 NOTE — Progress Notes (Signed)
Pt unable to void. NP on call notified if pt can have an order for in and out cauterization. Will continue to monitor.

## 2017-07-17 ENCOUNTER — Encounter (HOSPITAL_COMMUNITY): Payer: Self-pay | Admitting: Physician Assistant

## 2017-07-17 ENCOUNTER — Inpatient Hospital Stay (HOSPITAL_COMMUNITY): Payer: Non-veteran care

## 2017-07-17 DIAGNOSIS — R945 Abnormal results of liver function studies: Secondary | ICD-10-CM

## 2017-07-17 DIAGNOSIS — R7989 Other specified abnormal findings of blood chemistry: Secondary | ICD-10-CM

## 2017-07-17 DIAGNOSIS — K839 Disease of biliary tract, unspecified: Secondary | ICD-10-CM

## 2017-07-17 LAB — BASIC METABOLIC PANEL
ANION GAP: 5 (ref 5–15)
BUN: <5 mg/dL — ABNORMAL LOW (ref 6–20)
CHLORIDE: 109 mmol/L (ref 101–111)
CO2: 25 mmol/L (ref 22–32)
Calcium: 8.3 mg/dL — ABNORMAL LOW (ref 8.9–10.3)
Creatinine, Ser: 0.74 mg/dL (ref 0.44–1.00)
GFR calc non Af Amer: >60 mL/min (ref >60)
Glucose, Bld: 97 mg/dL (ref 65–99)
Potassium: 4 mmol/L (ref 3.5–5.1)
Sodium: 139 mmol/L (ref 135–145)

## 2017-07-17 LAB — CK: Total CK: 564 U/L — ABNORMAL HIGH (ref 38–234)

## 2017-07-17 MED ORDER — NICOTINE 21 MG/24HR TD PT24
21.0000 mg | MEDICATED_PATCH | Freq: Every day | TRANSDERMAL | Status: DC
  Administered 2017-07-17 – 2017-07-19 (×3): 21 mg via TRANSDERMAL
  Filled 2017-07-17 (×4): qty 1

## 2017-07-17 NOTE — Consult Note (Signed)
[facility] Consult: 12:27 PM 07/17/2017  LOS: 2 days    Referring Provider: Dr Conni Slipper  Primary Care Physician:  Patient, No Pcp Per Primary Gastroenterologist:  unassigned     Reason for Consultation:  Dilated bile ducts and elevated LFTs.     HPI: [name] is a homeless 31 y.o. female veteran.  PHM polysubstance abuse.  Depression.  PTSD.  S/p appendectomy and tubal ligation.    Brought by EMS to ED after displaying bizarre behavior.  Used Crack and heroin on 8/14.   UDS + for cocaine, opiates, benzodiazepine. etoh <5.  elevated CPK c/w rhabdo.  Mild renal insufficiency, hypokalemia resolved.    LFTs: T bili 1.4 >> 0.8.  Alk phos 32 >> 27.  AST/ALT 135/58 >> 62/43.  Hep C positive.    abd ultrasound: CBD 11 mm.  Liver heterogeneous with no focal lesion: non-specific, ? hepatitis.    MRCP:  Mild intrahepatic biliary ductal dilatation with CBD up to 11 mm proximally and 8 mm distally, with abrupt tapering immediately above the level of the ampulla. The possibility of a very short segment stricture is not excluded. No choledocholithiasis or definite obstructing mass identified on today's noncontrast examination.    Patient has had vague, nagging, non-severe discomfort in her lower abdomen. When asked how long this has been occurring, she answers "I couldn't tell you".  Because of her heroin use she says she wasn't that focused on the pain so she doesn't know how long it lasted. It wasn't severe enough that she was taking any medicine for it. She does take [phone] mg Advil daily several times a week for pain in her right ankle.  She doesn't have nausea or vomiting dysphagia, heartburn. No intolerance to fatty foods. No history of gallbladder, liver problems No history jaundice.  He has had constipation  recently with her heroin use. Has not seen any blood per rectum recently but she recalls having seen a little bit of blood with bowel movements in the past, again she can't clarify as to when but it sounds like it was several months ago.  Back in 2003, after [place] from [place] she drank heavily for a few months but has been alcohol sober since then.  No previous colonoscopy or upper endoscopy. No family history of colon cancer, inflammatory bowel disease, autoimmune disorders, liver or gallbladder disease.    Past Medical History:  Diagnosis Date  . Depression with anxiety   . Dilated bile duct 07/2017   CBD up to 11 mm on ultrasound and MRCP.  no choledocholithiasis or gallstones.    . [name] antibody positive in blood 07/2017  . Polysubstance abuse   . PTSD (post-traumatic stress disorder)     Past Surgical History:  Procedure Laterality Date  . APPENDECTOMY    . TUBAL LIGATION      Prior to Admission medications   Medication Sig Start Date End Date Taking? Authorizing Provider  doxycycline (VIBRAMYCIN) 100 MG capsule Take 100 mg by mouth 2 (two) times daily. 06/15/17  Yes [provider]    Scheduled Meds: . enoxaparin (LOVENOX) injection  40 mg Subcutaneous Q24H  . folic acid  1 mg Oral Daily  . pneumococcal 23 valent vaccine  0.5 mL Intramuscular Tomorrow-1000  . thiamine  100 mg Oral Daily   Infusions:  PRN Meds: acetaminophen, LORazepam   Allergies as of 07/15/2017  . (No Known Allergies)    Family History  Problem Relation Age of Onset  . Family history unknown: Yes    Social History   Social History  . Marital status: Single    Spouse name: N/A  . Number of children: N/A  . Years of education: N/A   Occupational History  . Not on file.   Social History Main Topics  . Smoking status: Current Every Day Smoker    Packs/day: 0.50    Years: 16.00    Types: Cigarettes  . Smokeless tobacco: Never Used  . Alcohol use No  . Drug use: Yes      Types: Cocaine, Heroin     Comment: 07/16/2017 "last used 07/14/2017  . Sexual activity: Not on file   Other Topics Concern  . Not on file   Social History Narrative  . No narrative on file    REVIEW OF SYSTEMS: Constitutional:  Generally feelstired. No profound weakness. ENT:  No nose bleeds Pulm:  Occasional cough, nonpurulent sputum.  When she's having acute anxiety she does get short of breath CV:  No palpitations.  Occasional painless,LE edema. No chest pain GU: Dysuria. No foul-smelling urine. No Nasrin colored urine. GI:  No dysphagia..  See HPI Heme:  No unusual or excessive bleeding/bruising. No history of anemia.   Transfusions:  No previous blood transfusions. Neuro:  Occasional headaches.  No peripheral tingling or numbness Derm:  No itching, no rash or sores.  Endocrine:  No sweats or chills.  No polyuria or dysuria Immunization:  Did not inquire as to immunizations history. Travel:  None beyond local counties in last few months.    PHYSICAL EXAM: Vital signs in last 24 hours: Vitals:   07/16/17 2253 07/17/17 0606  BP: (!) 122/59 122/64  Pulse: 84 77  Resp: 15 16  Temp: 98.8 F (37.1 C) 98.4 F (36.9 C)  SpO2: 97% 96%   Wt Readings from Last 3 Encounters:  07/16/17 110.1 kg (242 lb 11.6 oz)    General: obese, depressed, not physically ill appearing AAF Head:  No facial asymmetry. No signs of head trauma. No facial edema  Eyes:  No scleral icterus. No conjunctival pallor. EOMI. Ears:  Not hard of hearing.  Nose:  No congestion or discharge. Mouth:  oropharynx moist and clear. Tongue midline. Good dentition. Neck:  No JVD. No masses, no thyromegaly. Lungs:  Good breath sounds. No dyspnea. No cough. Heart: RRR. No MRG. S1, S2 present Abdomen:  Slightly obese. Soft. Not distended. Active bowel sounds.No HSM, hernias, masses. Nontender..   Rectal: deferred   Musc/Skeltl: no joint erythema, swelling or contracture deformities. Extremities:  No CCE.   Neurologic:  Alert though lethargic. Oriented times 3. Appropriate. No limb weakness, no tremors. Skin:  No rashes, AVMs, sores. Nodes:  No cervical adenopathy.   Psych:  Flat affect. Mumbles her words. Not anxious or tearful  Intake/Output from previous day: 08/16 0701 - 08/17 0700 In: 1023.8 [P.O.:240; I.V.:783.8] Out: 3100 [Urine:3100] Intake/Output this shift: Total I/O In: 240 [P.O.:240] Out: 600 [Urine:600]  LAB RESULTS:  Recent Labs  07/15/17 0330 07/16/17 0515  WBC 6.9 4.8  HGB  11.5* 11.3*  HCT 32.8* 33.5*  PLT 339 325   BMET Lab Results  Component Value Date   NA 139 07/17/2017   NA 142 07/16/2017   NA 137 07/15/2017   K 4.0 07/17/2017   K 3.5 07/16/2017   K 3.1 (L) 07/15/2017   CL 109 07/17/2017   CL 113 (H) 07/16/2017   CL 107 07/15/2017   CO2 25 07/17/2017   CO2 23 07/16/2017   CO2 21 (L) 07/15/2017   GLUCOSE 97 07/17/2017   GLUCOSE 95 07/16/2017   GLUCOSE 94 07/15/2017   BUN <5 (L) 07/17/2017   BUN <5 (L) 07/16/2017   BUN 9 07/15/2017   CREATININE 0.74 07/17/2017   CREATININE 0.82 07/16/2017   CREATININE 1.15 (H) 07/15/2017   CALCIUM 8.3 (L) 07/17/2017   CALCIUM 7.7 (L) 07/16/2017   CALCIUM 8.2 (L) 07/15/2017   LFT  Recent Labs  07/15/17 0115 07/16/17 0515  PROT 6.4* 5.6*  ALBUMIN 3.6 3.0*  AST 135* 62*  ALT 58* 43  ALKPHOS 32* 27*  BILITOT 1.4* 0.8   PT/INR No results found for: INR, PROTIME Hepatitis Panel  Recent Labs  07/15/17 0658  HEPBSAG Negative  HCVAB >11.0*  HEPAIGM Negative  HEPBIGM Negative   C-Diff No components found for: CDIFF Lipase  No results found for: LIPASE  Drugs of Abuse     Component Value Date/Time   LABOPIA POSITIVE (A) 07/15/2017 0135   COCAINSCRNUR POSITIVE (A) 07/15/2017 0135   LABBENZ POSITIVE (A) 07/15/2017 0135   AMPHETMU NONE DETECTED 07/15/2017 0135   THCU NONE DETECTED 07/15/2017 0135   LABBARB NONE DETECTED 07/15/2017 0135     RADIOLOGY STUDIES: Mr Abdomen Mrcp Wo  Contrast  Result Date: 07/17/2017 CLINICAL DATA:  31 year old female with history of common bile duct dilatation on recent ultrasound examination. Followup study. EXAM: MRI ABDOMEN WITHOUT CONTRAST  (INCLUDING MRCP) TECHNIQUE: Multiplanar multisequence MR imaging of the abdomen was performed. Heavily T2-weighted images of the biliary and pancreatic ducts were obtained, and three-dimensional MRCP images were rendered by post processing. COMPARISON:  No prior abdominal MRI. Abdominal ultrasound 07/15/2017. FINDINGS: Comment: Today's study is limited for detection and characterization of visceral and/or vascular lesions by lack of IV gadolinium. Lower chest: Small bilateral pleural effusions lying dependently. Hepatobiliary: No definite cystic or solid hepatic lesions are identified on today's noncontrast examination. MRCP images are suboptimal secondary to patient motion. However, T2 weighted sequences clearly demonstrates some very mild intrahepatic biliary ductal dilatation. Common bile duct is also dilated measuring up to 11 mm proximally and 8 mm distally. There is abrupt narrowing of the distal common bile duct shortly above the level of the ampulla. No filling defect within the common bile duct to suggest choledocholithiasis. Gallbladder is normal in appearance. No cholelithiasis. Pancreas: No definite pancreatic mass identified on today's noncontrast examination. No pancreatic ductal dilatation. No pancreatic or peripancreatic fluid or inflammatory changes. Spleen:  Unremarkable. Adrenals/Urinary Tract: Bilateral adrenal glands and bilateral kidneys are normal in appearance. No hydroureteronephrosis in the visualized abdomen. Stomach/Bowel: Visualized portions are unremarkable. Vascular/Lymphatic: No aneurysm identified in the visualized abdominal vasculature. No lymphadenopathy noted in the visualized abdomen. Other: No significant volume of ascites noted in the visualized portions of the peritoneal cavity.  Musculoskeletal: No aggressive appearing osseous lesions are noted in the visualized portions of the skeleton. IMPRESSION: 1. Mild intrahepatic biliary ductal dilatation with common bile duct dilatation up to 11 mm proximally and 8 mm distally, with abrupt tapering immediately above the level of the  ampulla. The possibility of a very short segment stricture is not excluded. No choledocholithiasis or definite obstructing mass identified on today's noncontrast examination. Electronically Signed   By: Vinnie Langton M.D.   On: 07/17/2017 09:41   Mr 3d Recon At Scanner  Result Date: 07/17/2017 CLINICAL DATA:  31 year old female with history of common bile duct dilatation on recent ultrasound examination. Followup study. EXAM: MRI ABDOMEN WITHOUT CONTRAST  (INCLUDING MRCP) TECHNIQUE: Multiplanar multisequence MR imaging of the abdomen was performed. Heavily T2-weighted images of the biliary and pancreatic ducts were obtained, and three-dimensional MRCP images were rendered by post processing. COMPARISON:  No prior abdominal MRI. Abdominal ultrasound 07/15/2017. FINDINGS: Comment: Today's study is limited for detection and characterization of visceral and/or vascular lesions by lack of IV gadolinium. Lower chest: Small bilateral pleural effusions lying dependently. Hepatobiliary: No definite cystic or solid hepatic lesions are identified on today's noncontrast examination. MRCP images are suboptimal secondary to patient motion. However, T2 weighted sequences clearly demonstrates some very mild intrahepatic biliary ductal dilatation. Common bile duct is also dilated measuring up to 11 mm proximally and 8 mm distally. There is abrupt narrowing of the distal common bile duct shortly above the level of the ampulla. No filling defect within the common bile duct to suggest choledocholithiasis. Gallbladder is normal in appearance. No cholelithiasis. Pancreas: No definite pancreatic mass identified on today's noncontrast  examination. No pancreatic ductal dilatation. No pancreatic or peripancreatic fluid or inflammatory changes. Spleen:  Unremarkable. Adrenals/Urinary Tract: Bilateral adrenal glands and bilateral kidneys are normal in appearance. No hydroureteronephrosis in the visualized abdomen. Stomach/Bowel: Visualized portions are unremarkable. Vascular/Lymphatic: No aneurysm identified in the visualized abdominal vasculature. No lymphadenopathy noted in the visualized abdomen. Other: No significant volume of ascites noted in the visualized portions of the peritoneal cavity. Musculoskeletal: No aggressive appearing osseous lesions are noted in the visualized portions of the skeleton. IMPRESSION: 1. Mild intrahepatic biliary ductal dilatation with common bile duct dilatation up to 11 mm proximally and 8 mm distally, with abrupt tapering immediately above the level of the ampulla. The possibility of a very short segment stricture is not excluded. No choledocholithiasis or definite obstructing mass identified on today's noncontrast examination. Electronically Signed   By: Vinnie Langton M.D.   On: 07/17/2017 09:41     IMPRESSION:   *  Rhabdomyolysis.    *  Abnormal LFTs.  Dilated CBD on ultrasound and MRCP.  ? CBD stricture.    *  Hep C positive  *  Polysubstance abuse.    *  PTSD.  Anxiety, depression.     PLAN:     *  Per Dr Fuller Plan.  ? ERCP?  * Hep C quant ordered.  As [facility]. She probably needs referral for her [name] management to the [place].     Attending physician's note   I have taken a history, examined the patient and reviewed the chart. I agree with the Advanced Practitioner's note, impression and recommendations. Mildly elevated and improving LFTs in setting of rhabdomyolysis. Mildly dilated CBD on [place] and MRCP (suboptimal quality due to motion and no contrast) showed a mildly dilated CBD and abrupt narrowing of distal CBD. No biliary symptoms. Hep  C positive, awaiting Hep C quant.  Further evaluation with EUS and/or ERCP with Dr. Ardis Hughs next week if she remains an inpatient. Outpatient GI follow up at the [place] [facility] would be most appropriate for ongoing GI care needs. If she is  discharged over the weekend outpatient GI follow up at the [facility]. Trend LFTs.   Lucio Edward, MD [facility] [phone] Mon-Fri 8a-5p [phone] after 5p, weekends, holidays  Azucena Freed  07/17/2017, 12:27 PM Pager: [phone]

## 2017-07-17 NOTE — Progress Notes (Signed)
CSW received consult regarding substance use and homelessness. Patient was very sleepy. She reported that she has been sleeping at her dad's house and other family members. She has been in touch with the [place] housing program but she is on the list. She does not have a phone so has difficulty touching base with them. CSW suggested the [facility] as a resource for communication and provided other homeless resources. CSW also inquired about substance use. Patient did not want to talk about that but accepted resources.  CSW signing off.  Percell Locus Trinitey Roache LCSWA [phone]

## 2017-07-17 NOTE — Progress Notes (Addendum)
PROGRESS NOTE    [name]  IWP:[id number] DOB: [birth year] DOA: 07/15/2017 PCP: [name]   Brief Narrative: 31 year old female with history of polysubstance abuse, PTSD admitted for bizarre behavior and altered  mental status. Urine toxicology was positive for cocaine, opiates and benzodiazepine. Patient was found to have nontraumatic rhabdomyolysis and hypokalemia.  Assessment & Plan:  # Acute toxic encephalopathy in the setting of polysubstance abuse: Patient was alert awake and oriented today. Her mental status improved. No focal neurological deficit. CT head unremarkable. -Social worker consult -Patient was counseled  #Nontraumatic rhabdomyolysis in the setting of cocaine use: CK level trending down to 564 today. Discontinue IV fluids since patient has good oral intake.  # Hypokalemia: Repleted potassium chloride. Lites improved. Mild Acute kidney injury resolved.  #Elevated liver enzymes/transaminitis and dilated common bile duct: Ultrasound of abdomen reviewed. CBD dilated to 11 mm. 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 GI consult requested for further evaluation. I discussed this with the GI and with the patient.  #Acute urinary retention: Improved now.  #[name] antibody positive: Patient reported that she was known to have hep C antibody positive however viral study was negative in the [facility] hospital. I recommended patient to follow-up with her PCP and GI outpatient.  DVT prophylaxis: Lovenox subcutaneous Code Status: Full code Family Communication: Discussed with patient's father yesterday Disposition Plan: Likely discharge home in 1-2 days    Consultants:   GI  Procedures: None Antimicrobials: None  Subjective: Seen and examined at bedside. No nausea vomiting chest pain or shortness of breath. Has generalized weakness. Has good oral intake. Objective: Vitals:   07/16/17 0620 07/16/17 1442 07/16/17 2253 07/17/17 0606  BP: 118/60  122/70 (!) 122/59 122/64  Pulse: 90 78 84 77  Resp: 16 16 15 16   Temp: 98.7 F (37.1 C) 98.1 F (36.7 C) 98.8 F (37.1 C) 98.4 F (36.9 C)  TempSrc:  Oral Oral Oral  SpO2: 96% 97% 97% 96%  Weight:      Height:        Intake/Output Summary (Last 24 hours) at 07/17/17 1156 Last data filed at 07/17/17 1007  Gross Berry 24 hour  Intake          1023.75 ml  Output             3400 ml  Net         -2376.25 ml   Filed Weights   07/15/17 0121 07/15/17 0600 07/16/17 0500  Weight: 99.8 kg (220 lb) 107.4 kg (236 lb 12.4 oz) 110.1 kg (242 lb 11.6 oz)    Examination:  General exam: Not in distress  Respiratory system: Clear bilaterally, respiratory effort normal Cardiovascular system: S1 & S2 heard, RRR.  No pedal edema. Gastrointestinal system: Abdomen soft, nontender nondistended. Bowel sound positive. Central nervous system: Alert and oriented. No focal neurological deficits. Extremities: Symmetric 5 x 5 power. Skin: No rashes, lesions or ulcers Psychiatry: Judgement and insight appear normal. Mood & affect appropriate.     Data Reviewed: I have personally reviewed following labs and imaging studies  CBC:  Recent Labs Lab 07/15/17 0330 07/16/17 0515  WBC 6.9 4.8  NEUTROABS 4.3  --   HGB 11.5* 11.3*  HCT 32.8* 33.5*  MCV 85.9 88.9  PLT 339 505   Basic Metabolic Panel:  Recent Labs Lab 07/15/17 0115 07/16/17 0515 07/17/17 0539  NA 137 142 139  K 3.1* 3.5 4.0  CL 107 113* 109  CO2 21* 23  25  GLUCOSE 94 95 97  BUN 9 <5* <5*  CREATININE 1.15* 0.82 0.74  CALCIUM 8.2* 7.7* 8.3*   GFR: Estimated Creatinine Clearance: 131.3 mL/min (by C-G formula based on SCr of 0.74 mg/dL). Liver Function Tests:  Recent Labs Lab 07/15/17 0115 07/16/17 0515  AST 135* 62*  ALT 58* 43  ALKPHOS 32* 27*  BILITOT 1.4* 0.8  PROT 6.4* 5.6*  ALBUMIN 3.6 3.0*   No results for input(s): LIPASE, AMYLASE in the last 168 hours. No results for input(s): AMMONIA in the last 168  hours. Coagulation Profile: No results for input(s): INR, PROTIME in the last 168 hours. Cardiac Enzymes:  Recent Labs Lab 07/15/17 0115 07/16/17 0515 07/17/17 0539  CKTOTAL 3,790* 1,060* 564*  CKMB  --  6.5*  --    BNP (last 3 results) No results for input(s): PROBNP in the last 8760 hours. HbA1C: No results for input(s): HGBA1C in the last 72 hours. CBG:  Recent Labs Lab 07/15/17 0114 07/15/17 2309  GLUCAP 95 99   Lipid Profile: No results for input(s): CHOL, HDL, LDLCALC, TRIG, CHOLHDL, LDLDIRECT in the last 72 hours. Thyroid Function Tests: No results for input(s): TSH, T4TOTAL, FREET4, T3FREE, THYROIDAB in the last 72 hours. Anemia Panel: No results for input(s): VITAMINB12, FOLATE, FERRITIN, TIBC, IRON, RETICCTPCT in the last 72 hours. Sepsis Labs: No results for input(s): PROCALCITON, LATICACIDVEN in the last 168 hours.  No results found for this or any previous visit (from the past 240 hour(s)).       Radiology Studies: Mr Abdomen Mrcp Wo Contrast  Result Date: 07/17/2017 CLINICAL DATA:  31 year old female with history of common bile duct dilatation on recent ultrasound examination. Followup study. EXAM: MRI ABDOMEN WITHOUT CONTRAST  (INCLUDING MRCP) TECHNIQUE: Multiplanar multisequence MR imaging of the abdomen was performed. Heavily T2-weighted images of the biliary and pancreatic ducts were obtained, and three-dimensional MRCP images were rendered by post processing. COMPARISON:  No prior abdominal MRI. Abdominal ultrasound 07/15/2017. FINDINGS: Comment: Today's study is limited for detection and characterization of visceral and/or vascular lesions by lack of IV gadolinium. Lower chest: Small bilateral pleural effusions lying dependently. Hepatobiliary: No definite cystic or solid hepatic lesions are identified on today's noncontrast examination. MRCP images are suboptimal secondary to patient motion. However, T2 weighted sequences clearly demonstrates some very  mild intrahepatic biliary ductal dilatation. Common bile duct is also dilated measuring up to 11 mm proximally and 8 mm distally. There is abrupt narrowing of the distal common bile duct shortly above the level of the ampulla. No filling defect within the common bile duct to suggest choledocholithiasis. Gallbladder is normal in appearance. No cholelithiasis. Pancreas: No definite pancreatic mass identified on today's noncontrast examination. No pancreatic ductal dilatation. No pancreatic or peripancreatic fluid or inflammatory changes. Spleen:  Unremarkable. Adrenals/Urinary Tract: Bilateral adrenal glands and bilateral kidneys are normal in appearance. No hydroureteronephrosis in the visualized abdomen. Stomach/Bowel: Visualized portions are unremarkable. Vascular/Lymphatic: No aneurysm identified in the visualized abdominal vasculature. No lymphadenopathy noted in the visualized abdomen. Other: No significant volume of ascites noted in the visualized portions of the peritoneal cavity. Musculoskeletal: No aggressive appearing osseous lesions are noted in the visualized portions of the skeleton. IMPRESSION: 1. Mild intrahepatic biliary ductal dilatation with common bile duct dilatation up to 11 mm proximally and 8 mm distally, with abrupt tapering immediately above the level of the ampulla. The possibility of a very short segment stricture is not excluded. No choledocholithiasis or definite obstructing mass identified on today's noncontrast  examination. Electronically Signed   By: Vinnie Langton M.D.   On: 07/17/2017 09:41   Mr 3d Recon At Scanner  Result Date: 07/17/2017 CLINICAL DATA:  31 year old female with history of common bile duct dilatation on recent ultrasound examination. Followup study. EXAM: MRI ABDOMEN WITHOUT CONTRAST  (INCLUDING MRCP) TECHNIQUE: Multiplanar multisequence MR imaging of the abdomen was performed. Heavily T2-weighted images of the biliary and pancreatic ducts were obtained, and  three-dimensional MRCP images were rendered by post processing. COMPARISON:  No prior abdominal MRI. Abdominal ultrasound 07/15/2017. FINDINGS: Comment: Today's study is limited for detection and characterization of visceral and/or vascular lesions by lack of IV gadolinium. Lower chest: Small bilateral pleural effusions lying dependently. Hepatobiliary: No definite cystic or solid hepatic lesions are identified on today's noncontrast examination. MRCP images are suboptimal secondary to patient motion. However, T2 weighted sequences clearly demonstrates some very mild intrahepatic biliary ductal dilatation. Common bile duct is also dilated measuring up to 11 mm proximally and 8 mm distally. There is abrupt narrowing of the distal common bile duct shortly above the level of the ampulla. No filling defect within the common bile duct to suggest choledocholithiasis. Gallbladder is normal in appearance. No cholelithiasis. Pancreas: No definite pancreatic mass identified on today's noncontrast examination. No pancreatic ductal dilatation. No pancreatic or peripancreatic fluid or inflammatory changes. Spleen:  Unremarkable. Adrenals/Urinary Tract: Bilateral adrenal glands and bilateral kidneys are normal in appearance. No hydroureteronephrosis in the visualized abdomen. Stomach/Bowel: Visualized portions are unremarkable. Vascular/Lymphatic: No aneurysm identified in the visualized abdominal vasculature. No lymphadenopathy noted in the visualized abdomen. Other: No significant volume of ascites noted in the visualized portions of the peritoneal cavity. Musculoskeletal: No aggressive appearing osseous lesions are noted in the visualized portions of the skeleton. IMPRESSION: 1. Mild intrahepatic biliary ductal dilatation with common bile duct dilatation up to 11 mm proximally and 8 mm distally, with abrupt tapering immediately above the level of the ampulla. The possibility of a very short segment stricture is not excluded.  No choledocholithiasis or definite obstructing mass identified on today's noncontrast examination. Electronically Signed   By: Vinnie Langton M.D.   On: 07/17/2017 09:41        Scheduled Meds: . enoxaparin (LOVENOX) injection  40 mg Subcutaneous Q24H  . folic acid  1 mg Oral Daily  . pneumococcal 23 valent vaccine  0.5 mL Intramuscular Tomorrow-1000  . thiamine  100 mg Oral Daily   Continuous Infusions:    LOS: 2 days    Averee Harb Tanna Furry, MD [facility] Pager [phone]  If 7PM-7AM, please contact night-coverage [email or web address] Password [facility] 07/17/2017, 11:56 AM

## 2017-07-18 DIAGNOSIS — K834 Spasm of sphincter of Oddi: Secondary | ICD-10-CM

## 2017-07-18 DIAGNOSIS — F19129 Other psychoactive substance abuse with intoxication, unspecified: Secondary | ICD-10-CM | POA: Diagnosis not present

## 2017-07-18 MED ORDER — DIPHENHYDRAMINE HCL 25 MG PO CAPS
25.0000 mg | ORAL_CAPSULE | Freq: Once | ORAL | Status: AC
  Administered 2017-07-18: 25 mg via ORAL
  Filled 2017-07-18: qty 1

## 2017-07-18 NOTE — Progress Notes (Signed)
CROSS COVER [facility] Subjective: . to be doing fairly well today. She denies having any abdominal pain nausea vomiting. [facility] hospital[facility] Dr. 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 EUS and ERCP requested on Monday 07/20/17.  Objective: Vital signs in last 24 hours: Temp:  [98.4 F (36.9 C)-98.7 F (37.1 C)] 98.7 F (37.1 C) (08/17 2221) Pulse Rate:  [77-91] 91 (08/17 2221) Resp:  [16-17] 17 (08/17 2221) BP: (122-143)/(63-94) 143/94 (08/17 2221) SpO2:  [96 %-97 %] 97 % (08/17 2221) Last BM Date:  (pta)  Intake/Output from previous day: 08/17 0701 - 08/18 0700 In: 600 [P.O.:600] Out: 1200 [Urine:1200] Intake/Output this shift: Total I/O In: -  Out: 600 [Urine:600]  General appearance: alert, cooperative, no distress and moderately obese Resp: clear to auscultation bilaterally Cardio: regular rate and rhythm, S1, S2 normal, no murmur, click, rub or gallop GI: soft, non-tender; bowel sounds normal; no masses,  no organomegaly Extremities: extremities normal, atraumatic, no cyanosis or edema  Lab Results:  Recent Labs  07/16/17 0515  WBC 4.8  HGB 11.3*  HCT 33.5*  PLT 325   BMET  Recent Labs  07/16/17 0515 07/17/17 0539  NA 142 139  K 3.5 4.0  CL 113* 109  CO2 23 25  GLUCOSE 95 97  BUN <5* <5*  CREATININE 0.82 0.74  CALCIUM 7.7* 8.3*   LFT  Recent Labs  07/16/17 0515  PROT 5.6*  ALBUMIN 3.0*  AST 62*  ALT 43  ALKPHOS 27*  BILITOT 0.8   PT/INR No results for input(s): LABPROT, INR in the last 72 hours. Hepatitis Panel  Recent Labs  07/15/17 0658  HEPBSAG Negative  HCVAB >11.0*  HEPAIGM Negative  HEPBIGM Negative   C-Diff No results for input(s): CDIFFTOX in the last 72 hours. No results for input(s): CDIFFPCR in the last 72 hours. Fecal Lactopherrin No results for input(s): FECLLACTOFRN in the last 72  hours.  Studies/Results: Mr Abdomen Mrcp Wo Contrast  Result Date: 07/17/2017 CLINICAL DATA:  31 year old female with history of common bile duct dilatation on recent ultrasound examination. Followup study. EXAM: MRI ABDOMEN WITHOUT CONTRAST  (INCLUDING MRCP) TECHNIQUE: Multiplanar multisequence MR imaging of the abdomen was performed. Heavily T2-weighted images of the biliary and pancreatic ducts were obtained, and three-dimensional MRCP images were rendered by post processing. COMPARISON:  No prior abdominal MRI. Abdominal ultrasound 07/15/2017. FINDINGS: Comment: Today's study is limited for detection and characterization of visceral and/or vascular lesions by lack of IV gadolinium. Lower chest: Small bilateral pleural effusions lying dependently. Hepatobiliary: No definite cystic or solid hepatic lesions are identified on today's noncontrast examination. MRCP images are suboptimal secondary to patient motion. However, T2 weighted sequences clearly demonstrates some very mild intrahepatic biliary ductal dilatation. Common bile duct is also dilated measuring up to 11 mm proximally and 8 mm distally. There is abrupt narrowing of the distal common bile duct shortly above the level of the ampulla. No filling defect within the common bile duct to suggest choledocholithiasis. Gallbladder is normal in appearance. No cholelithiasis. Pancreas: No definite pancreatic mass identified on today's noncontrast examination. No pancreatic ductal dilatation. No pancreatic or peripancreatic fluid or inflammatory changes. Spleen:  Unremarkable. Adrenals/Urinary Tract: Bilateral adrenal glands and bilateral kidneys are normal in appearance. No hydroureteronephrosis in the visualized abdomen. Stomach/Bowel: Visualized portions are unremarkable. Vascular/Lymphatic: No aneurysm identified in the visualized abdominal vasculature. No lymphadenopathy noted in  the visualized abdomen. Other: No significant volume of ascites noted in the  visualized portions of the peritoneal cavity. Musculoskeletal: No aggressive appearing osseous lesions are noted in the visualized portions of the skeleton. IMPRESSION: 1. Mild intrahepatic biliary ductal dilatation with common bile duct dilatation up to 11 mm proximally and 8 mm distally, with abrupt tapering immediately above the level of the ampulla. The possibility of a very short segment stricture is not excluded. No choledocholithiasis or definite obstructing mass identified on today's noncontrast examination. Electronically Signed   By: Vinnie Langton M.D.   On: 07/17/2017 09:41   Mr 3d Recon At Scanner  Result Date: 07/17/2017 CLINICAL DATA:  31 year old female with history of common bile duct dilatation on recent ultrasound examination. Followup study. EXAM: MRI ABDOMEN WITHOUT CONTRAST  (INCLUDING MRCP) TECHNIQUE: Multiplanar multisequence MR imaging of the abdomen was performed. Heavily T2-weighted images of the biliary and pancreatic ducts were obtained, and three-dimensional MRCP images were rendered by post processing. COMPARISON:  No prior abdominal MRI. Abdominal ultrasound 07/15/2017. FINDINGS: Comment: Today's study is limited for detection and characterization of visceral and/or vascular lesions by lack of IV gadolinium. Lower chest: Small bilateral pleural effusions lying dependently. Hepatobiliary: No definite cystic or solid hepatic lesions are identified on today's noncontrast examination. MRCP images are suboptimal secondary to patient motion. However, T2 weighted sequences clearly demonstrates some very mild intrahepatic biliary ductal dilatation. Common bile duct is also dilated measuring up to 11 mm proximally and 8 mm distally. There is abrupt narrowing of the distal common bile duct shortly above the level of the ampulla. No filling defect within the common bile duct to suggest choledocholithiasis. Gallbladder is normal in appearance. No cholelithiasis. Pancreas: No definite  pancreatic mass identified on today's noncontrast examination. No pancreatic ductal dilatation. No pancreatic or peripancreatic fluid or inflammatory changes. Spleen:  Unremarkable. Adrenals/Urinary Tract: Bilateral adrenal glands and bilateral kidneys are normal in appearance. No hydroureteronephrosis in the visualized abdomen. Stomach/Bowel: Visualized portions are unremarkable. Vascular/Lymphatic: No aneurysm identified in the visualized abdominal vasculature. No lymphadenopathy noted in the visualized abdomen. Other: No significant volume of ascites noted in the visualized portions of the peritoneal cavity. Musculoskeletal: No aggressive appearing osseous lesions are noted in the visualized portions of the skeleton. IMPRESSION: 1. Mild intrahepatic biliary ductal dilatation with common bile duct dilatation up to 11 mm proximally and 8 mm distally, with abrupt tapering immediately above the level of the ampulla. The possibility of a very short segment stricture is not excluded. No choledocholithiasis or definite obstructing mass identified on today's noncontrast examination. Electronically Signed   By: Vinnie Langton M.D.   On: 07/17/2017 09:41   Medications: I have reviewed the patient's current medications.  Assessment/Plan: 1. Nontraumatic rhabdomyolysis in the setting of cocaine use CKs improving 2. 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 EUS/ERCP planned for Monday to be done by Dr. Owens Loffler.  3. [name] antibody positive.  4. Acute kidney injury-now resolved.  LOS: 3 days   Eytan Carrigan 07/18/2017, 3:42 AM

## 2017-07-18 NOTE — Progress Notes (Signed)
PROGRESS NOTE    [name]  OBS:[id number] DOB: Jun 24, 1986 DOA: 07/15/2017 PCP: Patient, No Pcp Per   Brief Narrative: 31 year old female with history of polysubstance abuse, PTSD admitted for bizarre behavior and altered  mental status. Urine toxicology was positive for cocaine, opiates and benzodiazepine. Patient was found to have nontraumatic rhabdomyolysis and hypokalemia.  Assessment & Plan:  # Acute toxic encephalopathy in the setting of polysubstance abuse: Mentally status improved and around baseline.  CT head unremarkable. -Social worker consult -Patient was counseled  #Nontraumatic rhabdomyolysis in the setting of cocaine use: CK level improved with IV fluid. Patient has good oral intake.   # Hypokalemia: Repleted potassium chloride. Lites improved. Mild Acute kidney injury resolved.  #Elevated liver enzymes/transaminitis and dilated common bile duct: Ultrasound of abdomen reviewed. CBD dilated to 11 mm. 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 -GI consult appreciated. I discussed with Dr. Collene Mares from GI recommended that patient will get ERCP and EUS on Monday. Patient is homeless and has poor outpatient follow-up.  #Acute urinary retention: Improved now.  #[name] antibody positive: Patient reported that she was known to have hep C antibody positive however viral study was negative in the [place] hospital. [name] virus RNA as per GI.  DVT prophylaxis: Lovenox subcutaneous Code Status: Full code Family Communication: Discussed with patient's father today Disposition Plan: Likely discharge home in 1-2 days    Consultants:   GI  Procedures: None Antimicrobials: None  Subjective: Seen and examined at bedside. Denied headache, dizziness, nausea vomiting chest pain shortness of breath.  Objective: Vitals:   07/17/17 0606 07/17/17 1500 07/17/17 2221 07/18/17 0527  BP: 122/64 126/63 (!) 143/94 135/61  Pulse: 77 79 91 79  Resp: 16 16 17 17   Temp:  98.4 F (36.9 C) 98.4 F (36.9 C) 98.7 F (37.1 C) 98.4 F (36.9 C)  TempSrc: Oral Oral Oral Oral  SpO2: 96% 97% 97% 98%  Weight:    108.9 kg (240 lb)  Height:        Intake/Output Summary (Last 24 hours) at 07/18/17 1133 Last data filed at 07/18/17 1010  Gross per 24 hour  Intake             1080 ml  Output              600 ml  Net              480 ml   Filed Weights   07/15/17 0600 07/16/17 0500 07/18/17 0527  Weight: 107.4 kg (236 lb 12.4 oz) 110.1 kg (242 lb 11.6 oz) 108.9 kg (240 lb)    Examination:  General exam: Not in distress Respiratory system: Clear bilateral, respiratory effort normal Cardiovascular system: Regular rate rhythm S1-S2 normal. No pedal edema Gastrointestinal system: Abdomen soft, nontender nondistended. Bowel sound positive. Central nervous system: Alert and oriented. No focal neurological deficits. Extremities: Symmetric 5 x 5 power. Skin: No rashes, lesions or ulcers Psychiatry: Judgement and insight appear normal. Mood & affect appropriate.     Data Reviewed: I have personally reviewed following labs and imaging studies  CBC:  Recent Labs Lab 07/15/17 0330 07/16/17 0515  WBC 6.9 4.8  NEUTROABS 4.3  --   HGB 11.5* 11.3*  HCT 32.8* 33.5*  MCV 85.9 88.9  PLT 339 476   Basic Metabolic Panel:  Recent Labs Lab 07/15/17 0115 07/16/17 0515 07/17/17 0539  NA 137 142 139  K 3.1* 3.5 4.0  CL 107 113* 109  CO2 21*  23 25  GLUCOSE 94 95 97  BUN 9 <5* <5*  CREATININE 1.15* 0.82 0.74  CALCIUM 8.2* 7.7* 8.3*   GFR: Estimated Creatinine Clearance: 130.5 mL/min (by C-G formula based on SCr of 0.74 mg/dL). Liver Function Tests:  Recent Labs Lab 07/15/17 0115 07/16/17 0515  AST 135* 62*  ALT 58* 43  ALKPHOS 32* 27*  BILITOT 1.4* 0.8  PROT 6.4* 5.6*  ALBUMIN 3.6 3.0*   No results for input(s): LIPASE, AMYLASE in the last 168 hours. No results for input(s): AMMONIA in the last 168 hours. Coagulation Profile: No results for  input(s): INR, PROTIME in the last 168 hours. Cardiac Enzymes:  Recent Labs Lab 07/15/17 0115 07/16/17 0515 07/17/17 0539  CKTOTAL 3,790* 1,060* 564*  CKMB  --  6.5*  --    BNP (last 3 results) No results for input(s): PROBNP in the last 8760 hours. HbA1C: No results for input(s): HGBA1C in the last 72 hours. CBG:  Recent Labs Lab 07/15/17 0114 07/15/17 2309  GLUCAP 95 99   Lipid Profile: No results for input(s): CHOL, HDL, LDLCALC, TRIG, CHOLHDL, LDLDIRECT in the last 72 hours. Thyroid Function Tests: No results for input(s): TSH, T4TOTAL, FREET4, T3FREE, THYROIDAB in the last 72 hours. Anemia Panel: No results for input(s): VITAMINB12, FOLATE, FERRITIN, TIBC, IRON, RETICCTPCT in the last 72 hours. Sepsis Labs: No results for input(s): PROCALCITON, LATICACIDVEN in the last 168 hours.  No results found for this or any previous visit (from the past 240 hour(s)).       Radiology Studies: Mr Abdomen Mrcp Wo Contrast  Result Date: 07/17/2017 CLINICAL DATA:  31 year old female with history of common bile duct dilatation on recent ultrasound examination. Followup study. EXAM: MRI ABDOMEN WITHOUT CONTRAST  (INCLUDING MRCP) TECHNIQUE: Multiplanar multisequence MR imaging of the abdomen was performed. Heavily T2-weighted images of the biliary and pancreatic ducts were obtained, and three-dimensional MRCP images were rendered by post processing. COMPARISON:  No prior abdominal MRI. Abdominal ultrasound 07/15/2017. FINDINGS: Comment: Today's study is limited for detection and characterization of visceral and/or vascular lesions by lack of IV gadolinium. Lower chest: Small bilateral pleural effusions lying dependently. Hepatobiliary: No definite cystic or solid hepatic lesions are identified on today's noncontrast examination. MRCP images are suboptimal secondary to patient motion. However, T2 weighted sequences clearly demonstrates some very mild intrahepatic biliary ductal dilatation.  Common bile duct is also dilated measuring up to 11 mm proximally and 8 mm distally. There is abrupt narrowing of the distal common bile duct shortly above the level of the ampulla. No filling defect within the common bile duct to suggest choledocholithiasis. Gallbladder is normal in appearance. No cholelithiasis. Pancreas: No definite pancreatic mass identified on today's noncontrast examination. No pancreatic ductal dilatation. No pancreatic or peripancreatic fluid or inflammatory changes. Spleen:  Unremarkable. Adrenals/Urinary Tract: Bilateral adrenal glands and bilateral kidneys are normal in appearance. No hydroureteronephrosis in the visualized abdomen. Stomach/Bowel: Visualized portions are unremarkable. Vascular/Lymphatic: No aneurysm identified in the visualized abdominal vasculature. No lymphadenopathy noted in the visualized abdomen. Other: No significant volume of ascites noted in the visualized portions of the peritoneal cavity. Musculoskeletal: No aggressive appearing osseous lesions are noted in the visualized portions of the skeleton. IMPRESSION: 1. Mild intrahepatic biliary ductal dilatation with common bile duct dilatation up to 11 mm proximally and 8 mm distally, with abrupt tapering immediately above the level of the ampulla. The possibility of a very short segment stricture is not excluded. No choledocholithiasis or definite obstructing mass identified on today's  noncontrast examination. Electronically Signed   By: Vinnie Langton M.D.   On: 07/17/2017 09:41   Mr 3d Recon At Scanner  Result Date: 07/17/2017 CLINICAL DATA:  31 year old female with history of common bile duct dilatation on recent ultrasound examination. Followup study. EXAM: MRI ABDOMEN WITHOUT CONTRAST  (INCLUDING MRCP) TECHNIQUE: Multiplanar multisequence MR imaging of the abdomen was performed. Heavily T2-weighted images of the biliary and pancreatic ducts were obtained, and three-dimensional MRCP images were rendered by  post processing. COMPARISON:  No prior abdominal MRI. Abdominal ultrasound 07/15/2017. FINDINGS: Comment: Today's study is limited for detection and characterization of visceral and/or vascular lesions by lack of IV gadolinium. Lower chest: Small bilateral pleural effusions lying dependently. Hepatobiliary: No definite cystic or solid hepatic lesions are identified on today's noncontrast examination. MRCP images are suboptimal secondary to patient motion. However, T2 weighted sequences clearly demonstrates some very mild intrahepatic biliary ductal dilatation. Common bile duct is also dilated measuring up to 11 mm proximally and 8 mm distally. There is abrupt narrowing of the distal common bile duct shortly above the level of the ampulla. No filling defect within the common bile duct to suggest choledocholithiasis. Gallbladder is normal in appearance. No cholelithiasis. Pancreas: No definite pancreatic mass identified on today's noncontrast examination. No pancreatic ductal dilatation. No pancreatic or peripancreatic fluid or inflammatory changes. Spleen:  Unremarkable. Adrenals/Urinary Tract: Bilateral adrenal glands and bilateral kidneys are normal in appearance. No hydroureteronephrosis in the visualized abdomen. Stomach/Bowel: Visualized portions are unremarkable. Vascular/Lymphatic: No aneurysm identified in the visualized abdominal vasculature. No lymphadenopathy noted in the visualized abdomen. Other: No significant volume of ascites noted in the visualized portions of the peritoneal cavity. Musculoskeletal: No aggressive appearing osseous lesions are noted in the visualized portions of the skeleton. IMPRESSION: 1. Mild intrahepatic biliary ductal dilatation with common bile duct dilatation up to 11 mm proximally and 8 mm distally, with abrupt tapering immediately above the level of the ampulla. The possibility of a very short segment stricture is not excluded. No choledocholithiasis or definite obstructing  mass identified on today's noncontrast examination. Electronically Signed   By: Vinnie Langton M.D.   On: 07/17/2017 09:41        Scheduled Meds: . enoxaparin (LOVENOX) injection  40 mg Subcutaneous Q24H  . folic acid  1 mg Oral Daily  . nicotine  21 mg Transdermal Daily  . thiamine  100 mg Oral Daily   Continuous Infusions:    LOS: 3 days    Dron Tanna Furry, MD [facility] Pager [phone]  If 7PM-7AM, please contact night-coverage [email or web address] Password [facility] 07/18/2017, 11:33 AM

## 2017-07-19 MED ORDER — ONDANSETRON HCL 4 MG PO TABS
4.0000 mg | ORAL_TABLET | Freq: Three times a day (TID) | ORAL | Status: DC | PRN
  Administered 2017-07-19 – 2017-07-20 (×2): 4 mg via ORAL
  Filled 2017-07-19 (×2): qty 1

## 2017-07-19 MED ORDER — ONDANSETRON HCL 4 MG PO TABS
8.0000 mg | ORAL_TABLET | Freq: Three times a day (TID) | ORAL | Status: DC | PRN
  Filled 2017-07-19: qty 2

## 2017-07-19 NOTE — Progress Notes (Signed)
PROGRESS NOTE    [name]  LKG:[id number] DOB: Dec 10, 1985 DOA: 07/15/2017 PCP: Patient, No Pcp Per   Brief Narrative: 31 year old female with history of polysubstance abuse, PTSD admitted for bizarre behavior and altered  mental status. Urine toxicology was positive for cocaine, opiates and benzodiazepine. Patient was found to have nontraumatic rhabdomyolysis and hypokalemia.  Assessment & Plan:  # Acute toxic encephalopathy in the setting of polysubstance abuse: Mentally status improved and around baseline.  CT head unremarkable. -Social worker consult -Patient was counseled   #Nontraumatic rhabdomyolysis in the setting of cocaine use: CK level improved with IV fluid. Patient has good oral intake.   # Hypokalemia: Repleted potassium chloride. Lites improved. Mild Acute kidney injury resolved.  #Elevated liver enzymes/transaminitis and dilated common bile duct: Ultrasound of abdomen reviewed. CBD dilated to 11 mm. 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 -GI consult appreciated. GI planning for EUS/ERCP tomorrow. Patient will be nothing by mouth from midnight.  #Acute urinary retention: Improved now.  #[name] antibody positive: Patient reported that she was known to have hep C antibody positive however viral study was negative in the [place] hospital. [name] virus RNA as per GI, follow up the result.  DVT prophylaxis: Lovenox subcutaneous Code Status: Full code Family Communication: Discussed with patient's father today Disposition Plan: Likely discharge home in 1-2 days    Consultants:   GI  Procedures: None Antimicrobials: None  Subjective: Seen and examined at bedside. No new event. Denied headache, dizziness, nausea or vomiting. Objective: Vitals:   07/18/17 1344 07/18/17 2112 07/19/17 0500 07/19/17 0552  BP: 124/73 120/72  (!) 114/57  Pulse: 74 (!) 108  84  Resp: 18 18  18   Temp: 98.9 F (37.2 C) 99.2 F (37.3 C)  98.6 F (37 C)    TempSrc: Oral Oral  Oral  SpO2: 100% 99%  95%  Weight:   108.9 kg (240 lb 1.6 oz)   Height:        Intake/Output Summary (Last 24 hours) at 07/19/17 1206 Last data filed at 07/19/17 0900  Gross per 24 hour  Intake              420 ml  Output                0 ml  Net              420 ml   Filed Weights   07/16/17 0500 07/18/17 0527 07/19/17 0500  Weight: 110.1 kg (242 lb 11.6 oz) 108.9 kg (240 lb) 108.9 kg (240 lb 1.6 oz)    Examination:  General exam: Not in distress Respiratory system: Clear bilateral, respiratory effort normal Cardiovascular system: Regular rate rhythm S1-S2 normal. No pedal edema Gastrointestinal system: Abdomen soft, nontender nondistended. Bowel sound positive Central nervous system: Alert and oriented. No focal neurological deficits. Extremities: Symmetric 5 x 5 power. Skin: No rashes, lesions or ulcers Psychiatry: Judgement and insight appear normal. Mood & affect appropriate.     Data Reviewed: I have personally reviewed following labs and imaging studies  CBC:  Recent Labs Lab 07/15/17 0330 07/16/17 0515  WBC 6.9 4.8  NEUTROABS 4.3  --   HGB 11.5* 11.3*  HCT 32.8* 33.5*  MCV 85.9 88.9  PLT 339 664   Basic Metabolic Panel:  Recent Labs Lab 07/15/17 0115 07/16/17 0515 07/17/17 0539  NA 137 142 139  K 3.1* 3.5 4.0  CL 107 113* 109  CO2 21* 23 25  GLUCOSE 94  95 97  BUN 9 <5* <5*  CREATININE 1.15* 0.82 0.74  CALCIUM 8.2* 7.7* 8.3*   GFR: Estimated Creatinine Clearance: 130.5 mL/min (by C-G formula based on SCr of 0.74 mg/dL). Liver Function Tests:  Recent Labs Lab 07/15/17 0115 07/16/17 0515  AST 135* 62*  ALT 58* 43  ALKPHOS 32* 27*  BILITOT 1.4* 0.8  PROT 6.4* 5.6*  ALBUMIN 3.6 3.0*   No results for input(s): LIPASE, AMYLASE in the last 168 hours. No results for input(s): AMMONIA in the last 168 hours. Coagulation Profile: No results for input(s): INR, PROTIME in the last 168 hours. Cardiac Enzymes:  Recent  Labs Lab 07/15/17 0115 07/16/17 0515 07/17/17 0539  CKTOTAL 3,790* 1,060* 564*  CKMB  --  6.5*  --    BNP (last 3 results) No results for input(s): PROBNP in the last 8760 hours. HbA1C: No results for input(s): HGBA1C in the last 72 hours. CBG:  Recent Labs Lab 07/15/17 0114 07/15/17 2309  GLUCAP 95 99   Lipid Profile: No results for input(s): CHOL, HDL, LDLCALC, TRIG, CHOLHDL, LDLDIRECT in the last 72 hours. Thyroid Function Tests: No results for input(s): TSH, T4TOTAL, FREET4, T3FREE, THYROIDAB in the last 72 hours. Anemia Panel: No results for input(s): VITAMINB12, FOLATE, FERRITIN, TIBC, IRON, RETICCTPCT in the last 72 hours. Sepsis Labs: No results for input(s): PROCALCITON, LATICACIDVEN in the last 168 hours.  No results found for this or any previous visit (from the past 240 hour(s)).       Radiology Studies: No results found.      Scheduled Meds: . enoxaparin (LOVENOX) injection  40 mg Subcutaneous Q24H  . folic acid  1 mg Oral Daily  . nicotine  21 mg Transdermal Daily  . thiamine  100 mg Oral Daily   Continuous Infusions:    LOS: 4 days    Dron Tanna Furry, MD [facility] Pager [phone]  If 7PM-7AM, please contact night-coverage [email or web address] Password [facility] 07/19/2017, 12:06 PM

## 2017-07-19 NOTE — Progress Notes (Signed)
CROSS COVER [facility] Subjective: Since I last evaluated the patient, she seems to be doing fairly well. Still complains of abdominal pain at 5/10. Denies having any vomiting. She was nauseated earlier today after she had [place] toast for breakfast.  Objective: Vital signs in last 24 hours: Temp:  [98.6 F (37 C)-99.2 F (37.3 C)] 98.6 F (37 C) (08/19 0552) Pulse Rate:  [74-108] 84 (08/19 0552) Resp:  [18] 18 (08/19 0552) BP: (114-124)/(57-73) 114/57 (08/19 0552) SpO2:  [95 %-100 %] 95 % (08/19 0552) Weight:  [108.9 kg (240 lb 1.6 oz)] 108.9 kg (240 lb 1.6 oz) (08/19 0500) Last BM Date:  (PTA)  Intake/Output from previous day: 08/18 0701 - 08/19 0700 In: 360 [P.O.:360] Out: -  Intake/Output this shift: Total I/O In: 420 [P.O.:420] Out: -   General appearance: alert, cooperative, appears stated age and no distress Resp: clear to auscultation bilaterally Cardio: regular rate and rhythm, S1, S2 normal, no murmur, click, rub or gallop GI: soft, non-tender; bowel sounds normal; no masses,  no organomegaly  Lab Results: No results for input(s): WBC, HGB, HCT, PLT in the last 72 hours. BMET  Recent Labs  07/17/17 0539  NA 139  K 4.0  CL 109  CO2 25  GLUCOSE 97  BUN <5*  CREATININE 0.74  CALCIUM 8.3*   Medications: I have reviewed the patient's current medications.  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 EUS/ERCP planned for tomorrow to be done by Dr. Ardis Hughs. Will check a liver panel tomorrow.  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 3) Depression/PTSD/Polysubstance abuse.  LOS: 4 days   Denora Wysocki 07/19/2017, 9:58 AM

## 2017-07-20 ENCOUNTER — Encounter (HOSPITAL_COMMUNITY): Payer: Self-pay | Admitting: Certified Registered Nurse Anesthetist

## 2017-07-20 ENCOUNTER — Inpatient Hospital Stay (HOSPITAL_COMMUNITY): Payer: Non-veteran care | Admitting: Certified Registered Nurse Anesthetist

## 2017-07-20 ENCOUNTER — Encounter (HOSPITAL_COMMUNITY)
Admission: EM | Disposition: A | Discharge status: Home / Self Care | Payer: Self-pay | Source: Home / Self Care | Attending: Nephrology

## 2017-07-20 DIAGNOSIS — K838 Other specified diseases of biliary tract: Secondary | ICD-10-CM

## 2017-07-20 DIAGNOSIS — R945 Abnormal results of liver function studies: Secondary | ICD-10-CM

## 2017-07-20 HISTORY — PX: EUS: SHX5427

## 2017-07-20 LAB — COMPREHENSIVE METABOLIC PANEL
ALBUMIN: 3.5 g/dL (ref 3.5–5.0)
ALT: 33 U/L (ref 14–54)
AST: 35 U/L (ref 15–41)
Alkaline Phosphatase: 34 U/L — ABNORMAL LOW (ref 38–126)
Anion gap: 7 (ref 5–15)
BUN: 11 mg/dL (ref 6–20)
CHLORIDE: 103 mmol/L (ref 101–111)
CO2: 26 mmol/L (ref 22–32)
Calcium: 9 mg/dL (ref 8.9–10.3)
Creatinine, Ser: 0.84 mg/dL (ref 0.44–1.00)
GFR calc Af Amer: >60 mL/min (ref >60)
GFR calc non Af Amer: >60 mL/min (ref >60)
GLUCOSE: 113 mg/dL — AB (ref 65–99)
POTASSIUM: 3.8 mmol/L (ref 3.5–5.1)
Sodium: 136 mmol/L (ref 135–145)
Total Bilirubin: 0.6 mg/dL (ref 0.3–1.2)
Total Protein: 6.7 g/dL (ref 6.5–8.1)

## 2017-07-20 LAB — CK: Total CK: 73 U/L (ref 38–234)

## 2017-07-20 SURGERY — ESOPHAGEAL ENDOSCOPIC ULTRASOUND (EUS) RADIAL
Anesthesia: Monitor Anesthesia Care | Laterality: Right

## 2017-07-20 MED ORDER — PROPOFOL 500 MG/50ML IV EMUL
INTRAVENOUS | Status: DC | PRN
  Administered 2017-07-20: 100 ug/kg/min via INTRAVENOUS

## 2017-07-20 MED ORDER — PROPOFOL 10 MG/ML IV BOLUS
INTRAVENOUS | Status: DC | PRN
  Administered 2017-07-20 (×2): 20 mg via INTRAVENOUS

## 2017-07-20 MED ORDER — SODIUM CHLORIDE 0.9 % IV SOLN
INTRAVENOUS | Status: DC
  Administered 2017-07-20: 08:00:00 via INTRAVENOUS

## 2017-07-20 MED ORDER — LIDOCAINE 2% (20 MG/ML) 5 ML SYRINGE
INTRAMUSCULAR | Status: DC | PRN
  Administered 2017-07-20: 80 mg via INTRAVENOUS

## 2017-07-20 MED ORDER — MIDAZOLAM HCL 2 MG/2ML IJ SOLN
INTRAMUSCULAR | Status: DC | PRN
Start: 2017-07-20 — End: 2017-07-20
  Administered 2017-07-20: 1 mg via INTRAVENOUS

## 2017-07-20 MED ORDER — SODIUM CHLORIDE 0.9 % IV SOLN: INTRAVENOUS | Status: DC

## 2017-07-20 NOTE — Progress Notes (Addendum)
Patient discharge home,instruction given and understanding of instruction was displayed by patient using teach back. IV removed. Patient has no prescriptions.  Patient has all of his belonging. Room checked. Patient displayed understanding using teach back.

## 2017-07-20 NOTE — Anesthesia Procedure Notes (Signed)
Procedure Name: MAC Date/Time: 07/20/2017 8:19 AM Performed by: Tressia Miners LEFFEW Pre-anesthesia Checklist: Patient identified, Emergency Drugs available, Suction available, Timeout performed and Patient being monitored Patient Re-evaluated:Patient Re-evaluated prior to induction Oxygen Delivery Method: Nasal cannula Placement Confirmation: positive ETCO2

## 2017-07-20 NOTE — Care Management Note (Signed)
Case Management Note  Patient Details  Name: [name] MRN: [id number] Date of Birth: 10-16-1986  Subjective/Objective:   Polysubstance abuse, AMS, nontraumatic rhabdomyolysis, hypokalemia                 Action/Plan: Discharge Planning: NCM spoke to pt and states she follows up at [facility]. States she was scheduled to attend a treatment plan arranged through the [place]. States she was scheduled to go on 07/15/2017 but was in the hospital. States she plans to contact the [facility] to see if she can have another date set up. She plans to get help for her substance abuse.   PCP [facility]  Expected Discharge Date:  07/20/17               Expected Discharge Plan:  Home/Self Care  In-House Referral:  NA  Discharge planning Services  CM Consult  Post Acute Care Choice:  NA Choice offered to:  NA  DME Arranged:  N/A DME Agency:  NA  HH Arranged:  NA HH Agency:  NA  Status of Service:  Completed, signed off  If discussed at [place] of Stay Meetings, dates discussed:    Additional Comments:  Erenest Rasher, RN 07/20/2017, 3:51 PM

## 2017-07-20 NOTE — Op Note (Signed)
[facility] Patient Name: [name] Procedure Date : 07/20/2017 MRN: [id number] Attending MD: Milus Banister , MD Date of Birth: 1986-05-24 CSN: [id number] Age: 31 Admit Type: Inpatient Procedure:                Upper EUS Indications:              Common bile duct dilation seen on MRCP admitted                            with mild rhabdomyolisis likely due to                            polysubstance abuse, elevated liver tests                            (completely normal this morning), dilated CBD on [place]                            and MRI (no mass lesions) Providers:                Milus Banister, MD, Cleda Daub, RN, Cherylynn Ridges, Technician, Tinnie Gens, Technician, Tressia Miners, CRNA Referring MD:             Lucio Edward, MD Medicines:                Monitored Anesthesia Care Complications:            No immediate complications. Estimated blood loss:                            None. Estimated Blood Loss:     Estimated blood loss: none. Procedure:                Pre-Anesthesia Assessment:                           - Prior to the procedure, a History and Physical                            was performed, and patient medications and                            allergies were reviewed. The patient's tolerance of                            previous anesthesia was also reviewed. The risks                            and benefits of the procedure and the sedation                            options  and risks were discussed with the patient.                            All questions were answered, and informed consent                            was obtained. Prior Anticoagulants: The patient has                            taken Lovenox (enoxaparin), last dose was 1 day                            prior to procedure. ASA Grade Assessment: II - A                            patient with mild systemic disease. After  reviewing                            the risks and benefits, the patient was deemed in                            satisfactory condition to undergo the procedure.                           After obtaining informed consent, the endoscope was                            passed under direct vision. Throughout the                            procedure, the patient's blood pressure, pulse, and                            oxygen saturations were monitored continuously. The                            [id number] ([id number]) scope was introduced through                            the mouth, and advanced to the second part of                            duodenum. The [id number] scope was                            introduced through the mouth, and advanced to the                            second part of duodenum. The upper EUS was                            accomplished without difficulty. The patient  tolerated the procedure well. Scope In: Scope Out: Findings:      Endoscopic Finding :      The examined esophagus was endoscopically normal.      The entire examined stomach was endoscopically normal.      The examined duodenum was endoscopically normal. This included very good       visualization of a normal appearing major papilla using a side viewing       duodenoscope.      Endosonographic Finding :      1. The CBD ranged in size from 20mm to 38mm, contained no stones or soft       tissue lesions.      2. Gallbladder was normal.      3. Pancreatic parenchyma was normal (no discrete masses or signs of       chronic pancreatitis).      4. Main pancreatic duct was normal.      5. Limited views of liver, spleen, portal and splenic vessels were all       normal. Impression:               - Normal UGI examination, including good views of                            normal appearing major papilla.                           - EUS shows slightly dilated CBD (6 to 70mm)  without                            stones. The pancreas was normal Recommendation:           - I suspect her slightly dilated extrahepatic                            biliary tree is normal for her or perhaps related                            to her recent acute illness from which she                            recovering well (rhabdo). Since her liver tests                            completely normalized I see no reason for further                            biliary workup.                           - She should follow up in the [facility] for                            substance abuse and possible [name].                           - OK for discharge from GI standpoint. Procedure Code(s):        ---  Professional ---                           [phone], Esophagogastroduodenoscopy, flexible,                            transoral; with endoscopic ultrasound examination                            limited to the esophagus, stomach or duodenum, and                            adjacent structures Diagnosis Code(s):        --- Professional ---                           K83.8, Other specified diseases of biliary tract CPT copyright 2016 American Medical Association. All rights reserved. The codes documented in this report are preliminary and upon coder review may  be revised to meet current compliance requirements. Milus Banister, MD 07/20/2017 8:58:26 AM This report has been signed electronically. Number of Addenda: 0

## 2017-07-20 NOTE — H&P (View-Only) (Signed)
CROSS COVER [facility] Subjective: Since I last evaluated the patient, she seems to be doing fairly well. Still complains of abdominal pain at 5/10. Denies having any vomiting. She was nauseated earlier today after she had [place] toast for breakfast.  Objective: Vital signs in last 24 hours: Temp:  [98.6 F (37 C)-99.2 F (37.3 C)] 98.6 F (37 C) (08/19 0552) Pulse Rate:  [74-108] 84 (08/19 0552) Resp:  [18] 18 (08/19 0552) BP: (114-124)/(57-73) 114/57 (08/19 0552) SpO2:  [95 %-100 %] 95 % (08/19 0552) Weight:  [108.9 kg (240 lb 1.6 oz)] 108.9 kg (240 lb 1.6 oz) (08/19 0500) Last BM Date:  (PTA)  Intake/Output from previous day: 08/18 0701 - 08/19 0700 In: 360 [P.O.:360] Out: -  Intake/Output this shift: Total I/O In: 420 [P.O.:420] Out: -   General appearance: alert, cooperative, appears stated age and no distress Resp: clear to auscultation bilaterally Cardio: regular rate and rhythm, S1, S2 normal, no murmur, click, rub or gallop GI: soft, non-tender; bowel sounds normal; no masses,  no organomegaly  Lab Results: No results for input(s): WBC, HGB, HCT, PLT in the last 72 hours. BMET  Recent Labs  07/17/17 0539  NA 139  K 4.0  CL 109  CO2 25  GLUCOSE 97  BUN <5*  CREATININE 0.74  CALCIUM 8.3*   Medications: I have reviewed the patient's current medications.  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 EUS/ERCP planned for tomorrow to be done by Dr. Ardis Hughs. Will check a liver panel tomorrow.  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 3) Depression/PTSD/Polysubstance abuse.  LOS: 4 days   Beya Tipps 07/19/2017, 9:58 AM

## 2017-07-20 NOTE — Interval H&P Note (Signed)
History and Physical Interval Note:  07/20/2017 8:02 AM  [name]  has presented today for surgery, with the diagnosis of CBD stricture.  The various methods of treatment have been discussed with the patient and family. After consideration of risks, benefits and other options for treatment, the patient has consented to  Procedure(s): ESOPHAGEAL ENDOSCOPIC ULTRASOUND (EUS) RADIAL/ERCP (Right) ENDOSCOPIC RETROGRADE CHOLANGIOPANCREATOGRAPHY (ERCP) WITH PROPOFOL (N/A) as a surgical intervention .  The patient's history has been reviewed, patient examined, no change in status, stable for surgery.  I have reviewed the patient's chart and labs.  Questions were answered to the patient's satisfaction.     Milus Banister

## 2017-07-20 NOTE — Transfer of Care (Signed)
Immediate Anesthesia Transfer of Care Note  Patient: Reece Agar  Procedure(s) Performed: Procedure(s): ESOPHAGEAL ENDOSCOPIC ULTRASOUND (EUS) RADIAL/ERCP (Right)  Patient Location: Endoscopy Unit  Anesthesia Type:MAC  Level of Consciousness: awake, alert , oriented, patient cooperative and responds to stimulation  Airway & Oxygen Therapy: Patient Spontanous Breathing and Patient connected to nasal cannula oxygen  Post-op Assessment: Report given to RN, Post -op Vital signs reviewed and stable and Patient moving all extremities X 4  Post vital signs: Reviewed and stable  Last Vitals:  Vitals:   07/20/17 0548 07/20/17 0729  BP: (!) 114/53 (!) 145/56  Pulse: (!) 102 82  Resp: 20 15  Temp: 37 C 36.8 C  SpO2: 98% 98%    Last Pain:  Vitals:   07/20/17 0729  TempSrc: Oral  PainSc: 5       Patients Stated Pain Goal: 2 (91/69/45 0388)  Complications: No apparent anesthesia complications

## 2017-07-20 NOTE — Progress Notes (Signed)
Discharge instruction given patient given, Patient requesting to shower and is waiting for ride to arrive.

## 2017-07-20 NOTE — Discharge Summary (Signed)
Physician Discharge Summary  Dalasia Predmore QQV:[id number] DOB: 1986-10-15 DOA: 07/15/2017  PCP: Patient, No Pcp Per  Admit date: 07/15/2017 Discharge date: 07/20/2017  Admitted From:home Disposition:home  Recommendations for Outpatient Follow-up:  1. Follow up with PCP in 1-2 weeks 2. Please follow up HCV viral load and treatment plan with your GI at [facility].   Home Health:No  Equipment/Devices:no Discharge Condition: Stable CODE STATUS: Full code Diet recommendation: Regular  Brief/Interim Summary: 31 year old female with history of polysubstance abuse, PTSD admitted for bizarre behavior and altered mental status. Urine toxicology was positive for cocaine, opiates and benzodiazepine. Patient was found to have nontraumatic rhabdomyolysis and hypokalemia.  # Acute toxic encephalopathy in the setting of polysubstance abuse: Mentally status improved and around baseline.  CT head unremarkable. -Social worker consult -Patient was counseled   #Nontraumatic rhabdomyolysis in the setting of cocaine use: CK level improved. Patient has good oral intake. Clinically improved.  # Hypokalemia: Repleted potassium chloride. Lites improved. Mild Acute kidney injury resolved.  #Elevated liver enzymes/transaminitis and dilated common bile duct: Ultrasound of abdomen reviewed. CBD dilated to 11 mm. 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 -GI consult evaluated the patient and underwent EUS/ERCP. Test was unremarkable. GI recommended to follow-up with GI at [place]. I discussed this with the patient and she verbalized understanding.  #Acute urinary retention: Improved now.  #[name] antibody positive: Patient reported that she was known to have hep C antibody positive however viral study was negative in the [place] hospital. [name] virus RNA as per GI, follow up the result outpatient.   Patient is clinically improved. I discussed with the social worker regarding discharge  plan. Denied headache, dizziness, nausea vomiting chest pain shortness of breath.  Discharge Diagnoses:  Principal Problem:   Rhabdomyolysis Active Problems:   Hypokalemia   Abnormal liver function   Drug intoxication with delirium (HCC)   Polysubstance abuse   Abnormal LFTs (liver function tests)   Dilated bile duct    Discharge Instructions  Discharge Instructions    Call MD for:  difficulty breathing, headache or visual disturbances    Complete by:  As directed    Call MD for:  extreme fatigue    Complete by:  As directed    Call MD for:  hives    Complete by:  As directed    Call MD for:  persistant dizziness or light-headedness    Complete by:  As directed    Call MD for:  persistant nausea and vomiting    Complete by:  As directed    Call MD for:  severe uncontrolled pain    Complete by:  As directed    Call MD for:  temperature >100.4    Complete by:  As directed    Diet general    Complete by:  As directed    Discharge instructions    Complete by:  As directed    Please follow up pending lab results including [name] viral load with your PCP and GI physician. Recommended to follow up with your GI physician at the [facility].   Increase activity slowly    Complete by:  As directed      Allergies as of 07/20/2017   No Known Allergies     Medication List    STOP taking these medications   doxycycline 100 MG capsule Commonly known as:  VIBRAMYCIN      Follow-up [place]. Schedule  an appointment as soon as possible for a visit in 1 week(s).   Contact information: [place] [phone]         No Known Allergies  Consultations: GI  Procedures/Studies: MRCP, ERCP  Subjective: Seen and examined at bedside. Denied headache, dizziness, nausea vomiting, chest pain, shortness of breath, abdominal pain.  Discharge Exam: Vitals:   07/20/17 0906  07/20/17 0916  BP: (!) 110/47 (!) 117/44  Pulse: 75 77  Resp: 17 (!) 22  Temp:    SpO2: 100% 99%   Vitals:   07/20/17 0729 07/20/17 0856 07/20/17 0906 07/20/17 0916  BP: (!) 145/56 (!) 100/56 (!) 110/47 (!) 117/44  Pulse: 82 91 75 77  Resp: 15 17 17  (!) 22  Temp: 98.2 F (36.8 C) 97.8 F (36.6 C)    TempSrc: Oral Oral    SpO2: 98% 100% 100% 99%  Weight:      Height:        General: Pt is alert, awake, not in acute distress Cardiovascular: RRR, S1/S2 +, no rubs, no gallops Respiratory: CTA bilaterally, no wheezing, no rhonchi Abdominal: Soft, NT, ND, bowel sounds + Extremities: no edema, no cyanosis    The results of significant diagnostics from this hospitalization (including imaging, microbiology, ancillary and laboratory) are listed below for reference.     Microbiology: No results found for this or any previous visit (from the past 240 hour(s)).   Labs: BNP (last 3 results) No results for input(s): BNP in the last 8760 hours. Basic Metabolic Panel:  Recent Labs Lab 07/15/17 0115 07/16/17 0515 07/17/17 0539 07/20/17 0329  NA 137 142 139 136  K 3.1* 3.5 4.0 3.8  CL 107 113* 109 103  CO2 21* 23 25 26   GLUCOSE 94 95 97 113*  BUN 9 <5* <5* 11  CREATININE 1.15* 0.82 0.74 0.84  CALCIUM 8.2* 7.7* 8.3* 9.0   Liver Function Tests:  Recent Labs Lab 07/15/17 0115 07/16/17 0515 07/20/17 0329  AST 135* 62* 35  ALT 58* 43 33  ALKPHOS 32* 27* 34*  BILITOT 1.4* 0.8 0.6  PROT 6.4* 5.6* 6.7  ALBUMIN 3.6 3.0* 3.5   No results for input(s): LIPASE, AMYLASE in the last 168 hours. No results for input(s): AMMONIA in the last 168 hours. CBC:  Recent Labs Lab 07/15/17 0330 07/16/17 0515  WBC 6.9 4.8  NEUTROABS 4.3  --   HGB 11.5* 11.3*  HCT 32.8* 33.5*  MCV 85.9 88.9  PLT 339 325   Cardiac Enzymes:  Recent Labs Lab 07/15/17 0115 07/16/17 0515 07/17/17 0539  CKTOTAL 3,790* 1,060* 564*  CKMB  --  6.5*  --    BNP: Invalid input(s):  POCBNP CBG:  Recent Labs Lab 07/15/17 0114 07/15/17 2309  GLUCAP 95 99   D-Dimer No results for input(s): DDIMER in the last 72 hours. Hgb A1c No results for input(s): HGBA1C in the last 72 hours. Lipid Profile No results for input(s): CHOL, HDL, LDLCALC, TRIG, CHOLHDL, LDLDIRECT in the last 72 hours. Thyroid function studies No results for input(s): TSH, T4TOTAL, T3FREE, THYROIDAB in the last 72 hours.  Invalid input(s): FREET3 Anemia work up No results for input(s): VITAMINB12, FOLATE, FERRITIN, TIBC, IRON, RETICCTPCT in the last 72 hours. Urinalysis    Component Value Date/Time   COLORURINE [place] (A) 07/15/2017 0135   APPEARANCEUR CLEAR 07/15/2017 0135   LABSPEC 1.030 07/15/2017 0135   PHURINE 5.0 07/15/2017 0135   GLUCOSEU NEGATIVE 07/15/2017 0135   HGBUR  NEGATIVE 07/15/2017 0135   BILIRUBINUR SMALL (A) 07/15/2017 0135   KETONESUR 5 (A) 07/15/2017 0135   PROTEINUR 30 (A) 07/15/2017 0135   NITRITE NEGATIVE 07/15/2017 0135   LEUKOCYTESUR NEGATIVE 07/15/2017 0135   Sepsis Labs Invalid input(s): PROCALCITONIN,  WBC,  LACTICIDVEN Microbiology No results found for this or any previous visit (from the past 240 hour(s)).   Time coordinating discharge: 26 minutes  SIGNED:   Rosita Fire, MD  [facility] 07/20/2017, 11:13 AM  If 7PM-7AM, please contact night-coverage [email or web address] Password [email or web address]

## 2017-07-20 NOTE — Anesthesia Postprocedure Evaluation (Signed)
Anesthesia Post Note  Patient: Quarry manager  Procedure(s) Performed: Procedure(s) (LRB): ESOPHAGEAL ENDOSCOPIC ULTRASOUND (EUS) RADIAL (Right)     Patient location during evaluation: Endoscopy Anesthesia Type: MAC Level of consciousness: awake and alert Pain management: pain level controlled Vital Signs Assessment: post-procedure vital signs reviewed and stable Respiratory status: spontaneous breathing, nonlabored ventilation, respiratory function stable and patient connected to nasal cannula oxygen Cardiovascular status: stable and blood pressure returned to baseline Anesthetic complications: no    Last Vitals:  Vitals:   07/20/17 0906 07/20/17 0916  BP: (!) 110/47 (!) 117/44  Pulse: 75 77  Resp: 17 (!) 22  Temp:    SpO2: 100% 99%    Last Pain:  Vitals:   07/20/17 0951  TempSrc:   PainSc: 0-No pain                 Crystallee Werden,JAMES TERRILL

## 2017-07-20 NOTE — Anesthesia Preprocedure Evaluation (Addendum)
Anesthesia Evaluation  Patient identified by MRN, date of birth, ID band Patient awake    Reviewed: Allergy & Precautions, NPO status , Patient's Chart, lab work & pertinent test results  Airway Mallampati: I  TM Distance: >3 FB Neck ROM: Full    Dental   Pulmonary Current Smoker,    breath sounds clear to auscultation       Cardiovascular  Rhythm:Regular Rate:Normal     Neuro/Psych  Neuromuscular disease    GI/Hepatic (+)     substance abuse  , Hepatitis -abd pain   Endo/Other    Renal/GU      Musculoskeletal   Abdominal   Peds  Hematology   Anesthesia Other Findings   Reproductive/Obstetrics                            Anesthesia Physical Anesthesia Plan  ASA: II  Anesthesia Plan: MAC   Post-op Pain Management:    Induction: Intravenous  PONV Risk Score and Plan: 3 and Ondansetron, Dexamethasone, Midazolam, Propofol infusion and Treatment may vary due to age or medical condition  Airway Management Planned: Natural Airway and Nasal Cannula  Additional Equipment:   Intra-op Plan:   Post-operative Plan: Extubation in OR  Informed Consent: I have reviewed the patients History and Physical, chart, labs and discussed the procedure including the risks, benefits and alternatives for the proposed anesthesia with the patient or authorized representative who has indicated his/her understanding and acceptance.   Dental advisory given  Plan Discussed with: CRNA  Anesthesia Plan Comments:       Anesthesia Quick Evaluation

## 2017-07-21 ENCOUNTER — Encounter (HOSPITAL_COMMUNITY): Payer: Self-pay | Admitting: Emergency Medicine

## 2017-07-30 ENCOUNTER — Emergency Department (HOSPITAL_COMMUNITY)
Admission: EM | Admit: 2017-07-30 | Discharge: 2017-07-30 | Disposition: A | Discharge status: Home / Self Care | Payer: Non-veteran care | Attending: Emergency Medicine | Admitting: Emergency Medicine

## 2017-07-30 ENCOUNTER — Encounter (HOSPITAL_COMMUNITY): Payer: Self-pay

## 2017-07-30 ENCOUNTER — Emergency Department (HOSPITAL_COMMUNITY): Payer: Non-veteran care

## 2017-07-30 DIAGNOSIS — M79671 Pain in right foot: Secondary | ICD-10-CM | POA: Diagnosis not present

## 2017-07-30 DIAGNOSIS — F17210 Nicotine dependence, cigarettes, uncomplicated: Secondary | ICD-10-CM | POA: Diagnosis not present

## 2017-07-30 DIAGNOSIS — M545 Low back pain, unspecified: Secondary | ICD-10-CM

## 2017-07-30 DIAGNOSIS — M79672 Pain in left foot: Secondary | ICD-10-CM | POA: Diagnosis not present

## 2017-07-30 DIAGNOSIS — Z79899 Other long term (current) drug therapy: Secondary | ICD-10-CM | POA: Diagnosis not present

## 2017-07-30 DIAGNOSIS — R2243 Localized swelling, mass and lump, lower limb, bilateral: Secondary | ICD-10-CM | POA: Diagnosis present

## 2017-07-30 LAB — URINALYSIS, ROUTINE W REFLEX MICROSCOPIC
Bilirubin Urine: NEGATIVE
GLUCOSE, UA: NEGATIVE mg/dL
HGB URINE DIPSTICK: NEGATIVE
KETONES UR: NEGATIVE mg/dL
LEUKOCYTES UA: NEGATIVE
NITRITE: NEGATIVE
PROTEIN: 30 mg/dL — AB
Specific Gravity, Urine: 1.029 (ref 1.005–1.030)
pH: 5 (ref 5.0–8.0)

## 2017-07-30 LAB — CBC WITH DIFFERENTIAL/PLATELET
BASOS PCT: 0 %
Basophils Absolute: 0 10*3/uL (ref 0.0–0.1)
Eosinophils Absolute: 0.1 10*3/uL (ref 0.0–0.7)
Eosinophils Relative: 1 %
HCT: 36.6 % (ref 36.0–46.0)
Hemoglobin: 11.9 g/dL — ABNORMAL LOW (ref 12.0–15.0)
Lymphocytes Relative: 16 %
Lymphs Abs: 1.5 10*3/uL (ref 0.7–4.0)
MCH: 29.8 pg (ref 26.0–34.0)
MCHC: 32.5 g/dL (ref 30.0–36.0)
MCV: 91.5 fL (ref 78.0–100.0)
Monocytes Absolute: 0.7 10*3/uL (ref 0.1–1.0)
Monocytes Relative: 7 %
NEUTROS PCT: 76 %
Neutro Abs: 7.2 10*3/uL (ref 1.7–7.7)
Platelets: 447 10*3/uL — ABNORMAL HIGH (ref 150–400)
RBC: 4 MIL/uL (ref 3.87–5.11)
RDW: 13.6 % (ref 11.5–15.5)
WBC: 9.5 10*3/uL (ref 4.0–10.5)

## 2017-07-30 LAB — BASIC METABOLIC PANEL
ANION GAP: 8 (ref 5–15)
BUN: 7 mg/dL (ref 6–20)
CO2: 26 mmol/L (ref 22–32)
CREATININE: 0.96 mg/dL (ref 0.44–1.00)
Calcium: 9 mg/dL (ref 8.9–10.3)
Chloride: 107 mmol/L (ref 101–111)
GFR calc non Af Amer: >60 mL/min (ref >60)
Glucose, Bld: 113 mg/dL — ABNORMAL HIGH (ref 65–99)
Potassium: 3.7 mmol/L (ref 3.5–5.1)
SODIUM: 141 mmol/L (ref 135–145)

## 2017-07-30 LAB — RAPID URINE DRUG SCREEN, HOSP PERFORMED
Amphetamines: POSITIVE — AB
Barbiturates: NOT DETECTED
Benzodiazepines: NOT DETECTED
COCAINE: POSITIVE — AB
Opiates: POSITIVE — AB
Tetrahydrocannabinol: NOT DETECTED

## 2017-07-30 LAB — BRAIN NATRIURETIC PEPTIDE: B Natriuretic Peptide: 31.7 pg/mL (ref 0.0–100.0)

## 2017-07-30 LAB — POC URINE PREG, ED: Preg Test, Ur: NEGATIVE

## 2017-07-30 MED ORDER — IBUPROFEN 400 MG PO TABS
600.0000 mg | ORAL_TABLET | Freq: Once | ORAL | Status: AC
  Administered 2017-07-30: 600 mg via ORAL
  Filled 2017-07-30: qty 1

## 2017-07-30 NOTE — Discharge Instructions (Signed)
All of your lab work and imaging has been reassuring. Please stop using drugs. Please follow-up in the foot Center for your pain and bilateral your feet. Drink plenty of fluids.

## 2017-07-30 NOTE — ED Notes (Signed)
Patient able to ambulate independently  

## 2017-07-30 NOTE — ED Notes (Signed)
Patient is resting comfortably. 

## 2017-07-30 NOTE — ED Notes (Signed)
Patient transported to x-ray. ?

## 2017-07-30 NOTE — ED Provider Notes (Signed)
[facility] DEPT Provider Note   CSN: [id number] Arrival date & time: 07/30/17  0931     History   Chief Complaint Chief Complaint  Patient presents with  . Foot Swelling    HPI [name] is a 31 y.o. female.  HPI 31 year old female past medical history significant for anxiety, polysubstance abuse, [name], IV drug use that presents to the emergency department today with complaints of feet swelling and pain. Patient states that she has been walking frequently. Patient very poor historian. However she did tell nurse that "she wanted a place to sleep." Patient also endorses low back pain. States it started approximately 2 days ago. No associated symptoms including urinary symptoms, vaginal symptoms, fevers, nausea, vomiting. Patient was recently discharged from the hospital on 8/20 for acute toxic encephalopathy and rhabdo due to cocaine and polysubstance abuse. The patient does report doing heroin and cocaine everyday since discharge. Reports heroin and cocaine use prior to presenting to the ED today. Patient's symptoms are very vague. Complains of blisters on her feet and painful when she walks. The back pain is worse with movement. She is not taking anything for her symptoms.  Pt denies any fever, chill, ha, vision changes, lightheadedness, dizziness, congestion, neck pain, cp, sob, cough, abd pain, n/v/d, urinary symptoms, change in bowel habits, melena, hematochezia, lower extremity paresthesias.  Past Medical History:  Diagnosis Date  . Ankle pain, chronic   . Anxiety   . Depression   . Depression with anxiety   . Dilated bile duct 07/2017   CBD up to 11 mm on ultrasound and MRCP.  no choledocholithiasis or gallstones.    . [name] antibody positive in blood 07/2017  . Polysubstance abuse   . PTSD (post-traumatic stress disorder)     Patient Active Problem List   Diagnosis Date Noted  . Dilated bile duct   . Abnormal LFTs (liver function tests)   . Drug  intoxication with delirium ([place])   . Polysubstance abuse   . Rhabdomyolysis 07/15/2017  . Hypokalemia 07/15/2017  . Abnormal liver function 07/15/2017  . Chronic [name] ([place]) 03/30/2013  . Bacterial vaginosis 03/30/2013  . Pyelonephritis 03/28/2013  . Polysubstance abuse 03/28/2013  . 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 Polysubstance dependence including opioid type drug, continuous use ([place]) 06/22/2012    Class: Chronic  . Substance induced mood disorder ([place]) 06/22/2012    Class: Chronic  . Smokes tobacco daily 01/28/2007  . DEPRESSIVE DISORDER, NOS 01/28/2007    Past Surgical History:  Procedure Laterality Date  . ANKLE SURGERY    . APPENDECTOMY    . EUS Right 07/20/2017   Procedure: ESOPHAGEAL ENDOSCOPIC ULTRASOUND (EUS) RADIAL;  Surgeon: Milus Banister, MD;  Location: [facility] ENDOSCOPY;  Service: Endoscopy;  Laterality: Right;  . TUBAL LIGATION     2009  . TUBAL LIGATION      OB History    No data available       Home Medications    Prior to Admission medications   Medication Sig Start Date End Date Taking? Authorizing Provider  doxycycline (VIBRAMYCIN) 100 MG capsule Take 1 capsule (100 mg total) by mouth 2 (two) times daily. 02/04/16   Jeannett Senior, PA-C    Family History Family History  Problem Relation Age of Onset  . Family history unknown: Yes    Social History Social History  Substance Use Topics  . Smoking status: Current Every Day Smoker    Packs/day: 0.50    Years: 16.00  Types: Cigarettes  . Smokeless tobacco: Never Used  . Alcohol use No     Comment: occasionally     Allergies   Penicillins cross reactors   Review of Systems Review of Systems  Constitutional: Negative for chills and fever.  HENT: Negative for congestion.   Eyes: Negative for visual disturbance.  Respiratory: Negative for cough and shortness of breath.   Cardiovascular: Negative for chest pain.  Gastrointestinal: Negative for abdominal pain, diarrhea, nausea  and vomiting.  Genitourinary: Negative for dysuria, flank pain, frequency, hematuria, urgency, vaginal bleeding and vaginal discharge.  Musculoskeletal: Positive for back pain and myalgias. Negative for arthralgias.  Skin: Positive for wound. Negative for rash.  Neurological: Negative for dizziness, syncope, weakness, light-headedness, numbness and headaches.  Psychiatric/Behavioral: Negative for sleep disturbance. The patient is not nervous/anxious.      Physical Exam Updated Vital Signs BP (!) 110/54   Pulse 91   Temp 98.7 F (37.1 C) (Rectal)   Resp 16   LMP  (LMP Unknown) Comment: tubes are clipped  SpO2 96%   Physical Exam  Constitutional: She is oriented to person, place, and time. She appears well-developed and well-nourished.  Non-toxic appearance. No distress.  The patient is drowsy but she responds to verbal stimuli. Able to answer questions appropriately.  Pt is chronically ill appearing and disheveled.   HENT:  Head: Normocephalic and atraumatic.  Nose: Nose normal.  Mouth/Throat: Oropharynx is clear and moist.  Eyes: Pupils are equal, round, and reactive to light. Conjunctivae are normal. Right eye exhibits no discharge. Left eye exhibits no discharge.  Neck: Normal range of motion. Neck supple.  Cardiovascular: Normal rate, regular rhythm, normal heart sounds and intact distal pulses.  Exam reveals no gallop and no friction rub.   No murmur heard. Pulmonary/Chest: Effort normal and breath sounds normal. No respiratory distress. She has no wheezes. She has no rales. She exhibits no tenderness.  Abdominal: Soft. Bowel sounds are normal. There is no tenderness. There is no rigidity, no rebound, no guarding, no CVA tenderness, no tenderness at McBurney's point and negative Murphy's sign.  Musculoskeletal: Normal range of motion. She exhibits no tenderness.  No midline T spine or L spine tenderness. No deformities or step offs noted. Full ROM. Pelvis is stable.  The  bilateral lumbar paraspinal tenderness noted.  Do not appreciate any swelling to her lower extremities. No calf tenderness. She is mildly tender to the palpation of both plantar region of her feet. No open blisters noted. Healed calluses. No erythema.  Lymphadenopathy:    She has no cervical adenopathy.  Neurological: She is alert and oriented to person, place, and time.  Patient is alert and responds to verbal stimuli. Answers questions appropriately. In-place with normal gait. Strength 5 out of 5 in lower extremities. Patellar reflexes are normal. Sensation intact.  Skin: Skin is warm and dry. Capillary refill takes less than 2 seconds.  Psychiatric: Her behavior is normal. Judgment and thought content normal.  Nursing note and vitals reviewed.    ED Treatments / Results  Labs (all labs ordered are listed, but only abnormal results are displayed) Labs Reviewed  BASIC METABOLIC PANEL - Abnormal; Notable for the following:       Result Value   Glucose, Bld 113 (*)    All other components within normal limits  CBC WITH DIFFERENTIAL/PLATELET - Abnormal; Notable for the following:    Hemoglobin 11.9 (*)    Platelets 447 (*)    All other components within normal  limits  URINALYSIS, ROUTINE W REFLEX MICROSCOPIC - Abnormal; Notable for the following:    Color, Urine Emrys (*)    APPearance HAZY (*)    Protein, ur 30 (*)    Bacteria, UA RARE (*)    Squamous Epithelial / LPF 6-30 (*)    All other components within normal limits  BRAIN NATRIURETIC PEPTIDE  RAPID URINE DRUG SCREEN, HOSP PERFORMED  POC URINE PREG, ED    EKG  EKG Interpretation None       Radiology No results found.  Procedures Procedures (including critical care time)  Medications Ordered in ED Medications  ibuprofen (ADVIL,MOTRIN) tablet 600 mg (not administered)     Initial Impression / Assessment and Plan / ED Course  I have reviewed the triage vital signs and the nursing notes.  Pertinent labs &  imaging results that were available during my care of the patient were reviewed by me and considered in my medical decision making (see chart for details).     Patient presents to the ED with vague complaints. Patient complains of low back pain and pain to her plantar region of her bilateral feet. Calluses noted. No edema appreciated on exam. Patient has no CVA tenderness. Patient has IV drug user. Did use cocaine and heroin prior to arrival. Patient is groggy but responds to verbal stimuli. Alert and oriented 4.  Vital signs are reassuring. Patient is not tachycardic. No hypotension. Patient is afebrile. Patient was recently discharged from the hospital last week for encephalopathy due to polysubstance abuse and rhabdomyolysis.  We'll obtain labs due to recent rhabdo diagnosis.  No leukocytosis is noted. Hemoglobin is at baseline. Creatinine is normal. Electrolytes are normal. UA shows no signs of infection. No hemoglobin noted in urine. Urine pregnant test is negative.  We will obtain imaging of her lumbar spine. Patient's pain likely musculoskeletal given that the she is tender to the paraspinal musculature. Patient has no focal neuro deficits. Neurovascularly intact in all extremities.  Clinical presentation is not concerning for DVT, spinal abscess, pyelonephritis, UTI, PID, ectopic pregnancy, cellulitis. Patient did state to nurse that "she just wanted a place to stay".  Patient is overall well-appearing. Vital signs are reassuring.   Pt is hemodynamically stable, in NAD, & able to ambulate in the ED. Evaluation does not show pathology that would require ongoing emergent intervention or inpatient treatment. I explained the diagnosis to the patient. Pain has been managed & has no complaints prior to dc. Pt is comfortable with above plan and is stable for discharge at this time. All questions were answered prior to disposition. Strict return precautions for f/u to the ED were discussed.  Encouraged follow up with PCP and podiatry.  Pt seen and examined by Dr. Ralene Bathe who is agreeable to the above plan.     Final Clinical Impressions(s) / ED Diagnoses   Final diagnoses:  Pain in both feet  Acute bilateral low back pain without sciatica    New Prescriptions New Prescriptions   No medications on file     Aaron Edelman 07/30/17 1651    Quintella Reichert, MD 07/31/17 [phone]

## 2017-07-30 NOTE — ED Notes (Signed)
Patient states she last smoked cocaine 3 grams this morning, and injected 1-2 bags of heroin. Patient keeping eyes closed while answering questions and trailing of during speech. Pt respirations 14, pupil constricted, able to speak full sentences.

## 2017-07-30 NOTE — ED Triage Notes (Addendum)
Pt here for swelling and blistering of the feet. Pt was recently admitted here for abd pain. Pt has hx of substance abuse including heroin and cocaine. Pt falling asleep in triage with twitching noted. Also reports lower back pain.

## 2017-08-25 ENCOUNTER — Emergency Department (HOSPITAL_COMMUNITY)
Admission: EM | Admit: 2017-08-25 | Discharge: 2017-08-25 | Disposition: A | Discharge status: Home / Self Care | Payer: Non-veteran care | Attending: Emergency Medicine | Admitting: Emergency Medicine

## 2017-08-25 ENCOUNTER — Encounter (HOSPITAL_COMMUNITY): Payer: Self-pay | Admitting: Nurse Practitioner

## 2017-08-25 DIAGNOSIS — M791 Myalgia: Secondary | ICD-10-CM | POA: Insufficient documentation

## 2017-08-25 DIAGNOSIS — F1510 Other stimulant abuse, uncomplicated: Secondary | ICD-10-CM | POA: Insufficient documentation

## 2017-08-25 DIAGNOSIS — F1410 Cocaine abuse, uncomplicated: Secondary | ICD-10-CM | POA: Insufficient documentation

## 2017-08-25 DIAGNOSIS — F17210 Nicotine dependence, cigarettes, uncomplicated: Secondary | ICD-10-CM | POA: Insufficient documentation

## 2017-08-25 LAB — RAPID URINE DRUG SCREEN, HOSP PERFORMED
Amphetamines: POSITIVE — AB
BENZODIAZEPINES: POSITIVE — AB
Barbiturates: NOT DETECTED
COCAINE: POSITIVE — AB
Opiates: POSITIVE — AB
Tetrahydrocannabinol: NOT DETECTED

## 2017-08-25 LAB — COMPREHENSIVE METABOLIC PANEL
ALBUMIN: 3.6 g/dL (ref 3.5–5.0)
ALT: 40 U/L (ref 14–54)
ANION GAP: 10 (ref 5–15)
AST: 35 U/L (ref 15–41)
Alkaline Phosphatase: 53 U/L (ref 38–126)
BUN: 9 mg/dL (ref 6–20)
CO2: 23 mmol/L (ref 22–32)
Calcium: 8.6 mg/dL — ABNORMAL LOW (ref 8.9–10.3)
Chloride: 102 mmol/L (ref 101–111)
Creatinine, Ser: 1.16 mg/dL — ABNORMAL HIGH (ref 0.44–1.00)
GFR calc Af Amer: >60 mL/min (ref >60)
GFR calc non Af Amer: >60 mL/min (ref >60)
GLUCOSE: 81 mg/dL (ref 65–99)
POTASSIUM: 3.3 mmol/L — AB (ref 3.5–5.1)
Sodium: 135 mmol/L (ref 135–145)
Total Bilirubin: 1.2 mg/dL (ref 0.3–1.2)
Total Protein: 6.8 g/dL (ref 6.5–8.1)

## 2017-08-25 LAB — URINALYSIS, ROUTINE W REFLEX MICROSCOPIC
Bilirubin Urine: NEGATIVE
Glucose, UA: NEGATIVE mg/dL
KETONES UR: 20 mg/dL — AB
LEUKOCYTES UA: NEGATIVE
NITRITE: NEGATIVE
Protein, ur: NEGATIVE mg/dL
Specific Gravity, Urine: 1.013 (ref 1.005–1.030)
pH: 5 (ref 5.0–8.0)

## 2017-08-25 LAB — CBC WITH DIFFERENTIAL/PLATELET
BASOS PCT: 0 %
Basophils Absolute: 0 10*3/uL (ref 0.0–0.1)
EOS ABS: 0.1 10*3/uL (ref 0.0–0.7)
EOS PCT: 1 %
HEMATOCRIT: 32.7 % — AB (ref 36.0–46.0)
Hemoglobin: 11.1 g/dL — ABNORMAL LOW (ref 12.0–15.0)
Lymphocytes Relative: 19 %
Lymphs Abs: 2.2 10*3/uL (ref 0.7–4.0)
MCH: 28.8 pg (ref 26.0–34.0)
MCHC: 33.9 g/dL (ref 30.0–36.0)
MCV: 84.7 fL (ref 78.0–100.0)
MONO ABS: 0.8 10*3/uL (ref 0.1–1.0)
MONOS PCT: 7 %
Neutro Abs: 8.7 10*3/uL — ABNORMAL HIGH (ref 1.7–7.7)
Neutrophils Relative %: 73 %
PLATELETS: 394 10*3/uL (ref 150–400)
RBC: 3.86 MIL/uL — ABNORMAL LOW (ref 3.87–5.11)
RDW: 13.6 % (ref 11.5–15.5)
WBC: 11.8 10*3/uL — ABNORMAL HIGH (ref 4.0–10.5)

## 2017-08-25 LAB — SALICYLATE LEVEL: Salicylate Lvl: <7 mg/dL (ref 2.8–30.0)

## 2017-08-25 LAB — CK: CK TOTAL: 213 U/L (ref 38–234)

## 2017-08-25 LAB — POC URINE PREG, ED: Preg Test, Ur: NEGATIVE

## 2017-08-25 LAB — ACETAMINOPHEN LEVEL: Acetaminophen (Tylenol), Serum: <10 ug/mL — ABNORMAL LOW (ref 10–30)

## 2017-08-25 MED ORDER — LORAZEPAM 1 MG PO TABS
2.0000 mg | ORAL_TABLET | ORAL | Status: DC | PRN
  Administered 2017-08-25 (×2): 2 mg via ORAL
  Filled 2017-08-25 (×2): qty 2

## 2017-08-25 NOTE — ED Provider Notes (Signed)
Received care of patient from Dr. Dayna Barker at [facility]. Please see previous notes for hx, physical and prior care. 31 yo female presents with concern for agitation in setting of using cocaine and methamphetamines.  Patient given ativan for agitation, is currently being monitored until clinically sober.  Patient awake, calm, has been eating, ambulating. Denies SI/HI/hallucinations. Provided resources for substance abuse.Patient discharged in stable condition with understanding of reasons to return.    Gareth Morgan, MD 08/27/17 1118

## 2017-08-25 NOTE — ED Provider Notes (Addendum)
Emergency Department Provider Note   I have reviewed the triage vital signs and the nursing notes.   HISTORY  Chief Complaint No chief complaint on file.   HPI [name] is a 31 y.o. female he past medical history of polysubstance abuse resulting in rhabdomyolysis,hepatitis and pyelonephritis presents to the emergency department today because  difficulty sitting still. Patient states that she did an unknown amount of cocaine and methamphetamines yesterday all day and then could not stay still last night so her sister kicked her out of the house she came here for further evaluation. Patient is not having any hallucinations, chest pain, back pain or abdominal pain. Just muscle pain and the need to keep moving. Also complains of anxiety. No fevers or recent illnesses. On chart review the patient was admitted for an observation stay back in the middle of August secondary to rhabdomyolysis with minimal kidney impairment.      Past Medical History:  Diagnosis Date  . Ankle pain, chronic   . Anxiety   . Depression   . Depression with anxiety   . Dilated bile duct 07/2017   CBD up to 11 mm on ultrasound and MRCP.  no choledocholithiasis or gallstones.    . [name] antibody positive in blood 07/2017  . Polysubstance abuse   . PTSD (post-traumatic stress disorder)     Patient Active Problem List   Diagnosis Date Noted  . Dilated bile duct   . Abnormal LFTs (liver function tests)   . Drug intoxication with delirium ([place])   . Polysubstance abuse   . Rhabdomyolysis 07/15/2017  . Hypokalemia 07/15/2017  . Abnormal liver function 07/15/2017  . Chronic [name] ([place]) 03/30/2013  . Bacterial vaginosis 03/30/2013  . Pyelonephritis 03/28/2013  . Polysubstance abuse 03/28/2013  . 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 Polysubstance dependence including opioid type drug, continuous use ([place]) 06/22/2012    Class: Chronic  . Substance induced mood disorder ([place]) 06/22/2012    Class:  Chronic  . Smokes tobacco daily 01/28/2007  . DEPRESSIVE DISORDER, NOS 01/28/2007    Past Surgical History:  Procedure Laterality Date  . ANKLE SURGERY    . APPENDECTOMY    . EUS Right 07/20/2017   Procedure: ESOPHAGEAL ENDOSCOPIC ULTRASOUND (EUS) RADIAL;  Surgeon: Milus Banister, MD;  Location: [facility] ENDOSCOPY;  Service: Endoscopy;  Laterality: Right;  . TUBAL LIGATION     2009  . TUBAL LIGATION        Allergies Penicillins cross reactors  Family History  Problem Relation Age of Onset  . Family history unknown: Yes    Social History Social History  Substance Use Topics  . Smoking status: Current Every Day Smoker    Packs/day: 0.50    Years: 16.00    Types: Cigarettes  . Smokeless tobacco: Never Used  . Alcohol use No     Comment: occasionally    Review of Systems  All other systems negative except as documented in the HPI. All pertinent positives and negatives as reviewed in the HPI. ____________________________________________   PHYSICAL EXAM:  VITAL SIGNS: ED Triage Vitals  Enc Vitals Group     BP      Pulse      Resp      Temp      Temp src      SpO2      Weight      Height      Head Circumference      Peak Flow  Pain Score      Pain Loc      Pain Edu?      Excl. in [place]?     Constitutional: Alert and oriented. Well appearing and in no acute distress. Eyes: Conjunctivae are normal. PERRL. EOMI. Head: Atraumatic. Nose: No congestion/rhinnorhea. Mouth/Throat: Mucous membranes are moist.  Oropharynx non-erythematous. Neck: No stridor.  No meningeal signs.   Cardiovascular: Normal rate, regular rhythm. Good peripheral circulation. Grossly normal heart sounds.   Respiratory: Normal respiratory effort.  No retractions. Lungs CTAB. Gastrointestinal: Soft and nontender. No distention.  Musculoskeletal: No lower extremity tenderness nor edema. No gross deformities of extremities. Neurologic:  Normal speech and language. No gross focal neurologic  deficits are appreciated. Anxious and moving around the bed. Skin:  Skin is warm, dry and intact. No rash noted.  ____________________________________________   LABS (all labs ordered are listed, but only abnormal results are displayed)  Labs Reviewed  CBC WITH DIFFERENTIAL/PLATELET - Abnormal; Notable for the following:       Result Value   WBC 11.8 (*)    RBC 3.86 (*)    Hemoglobin 11.1 (*)    HCT 32.7 (*)    Neutro Abs 8.7 (*)    All other components within normal limits  COMPREHENSIVE METABOLIC PANEL - Abnormal; Notable for the following:    Potassium 3.3 (*)    Creatinine, Ser 1.16 (*)    Calcium 8.6 (*)    All other components within normal limits  ACETAMINOPHEN LEVEL - Abnormal; Notable for the following:    Acetaminophen (Tylenol), Serum <10 (*)    All other components within normal limits  CK  SALICYLATE LEVEL  RAPID URINE DRUG SCREEN, HOSP PERFORMED  URINALYSIS, ROUTINE W REFLEX MICROSCOPIC   ____________________________________________  EKG   EKG Interpretation  Date/Time:    Ventricular Rate:    PR Interval:    QRS Duration:   QT Interval:    QTC Calculation:   R Axis:     Text Interpretation:         ____________________________________________  RADIOLOGY  No results found.  ____________________________________________   PROCEDURES  Procedure(s) performed:   Procedures   ____________________________________________   INITIAL IMPRESSION / ASSESSMENT AND PLAN / ED COURSE  Pertinent labs & imaging results that were available during my care of the patient were reviewed by me and considered in my medical decision making (see chart for details).  Likely sympathomimetic. Will give ativan, reeval and get labs.   reeval and patient sleeping, pending lab draw.   Multiple reevaluations, patient is sleeping. Labs reassurring. When awakens will need reevaluation for sobriety/psychiatric issues but would be stable for discharge if  eating/ambulating at baseline without psych issues.  ____________________________________________  FINAL CLINICAL IMPRESSION(S) / ED DIAGNOSES  Final diagnoses:  Methamphetamine abuse  Cocaine abuse     MEDICATIONS GIVEN DURING THIS VISIT:  Medications  LORazepam (ATIVAN) tablet 2 mg (2 mg Oral Given 08/25/17 1052)     NEW OUTPATIENT MEDICATIONS STARTED DURING THIS VISIT:  Current Discharge Medication List      Note:  This document was prepared using Dragon voice recognition software and may include unintentional dictation errors.     Merrily Pew, MD 08/25/17 [phone]

## 2017-08-25 NOTE — ED Notes (Signed)
Bed: XK55 Expected date:  Expected time:  Means of arrival:  Comments: EMS- 31yo F, polysubstance abuse

## 2017-08-25 NOTE — ED Triage Notes (Signed)
Patient was seen by [employer] walking down the street not acting normal twitching and talking to her self. EMS called to have her evaluated.

## 2017-08-25 NOTE — ED Notes (Signed)
Pt stated "I'm not ready to go yet.  I'm so tired."

## 2017-08-25 NOTE — ED Notes (Signed)
While escorting pt to [facility], pt stated "I had 2 bags that came in from the ambulance.  A black backpack."  No contents in [address], or neighboring lockers found.  1 black backpack with purple plaid found & given to Security to see if belongs to patient d/t bag not being labeled.

## 2017-08-25 NOTE — ED Notes (Signed)
Pt was given black backpack found d/t pt stating it was hers.  Per Security, pt still adamant there was 1 more bag but he informed pt, he and the RN found no other bags on her unit.

## 2017-10-20 ENCOUNTER — Emergency Department (HOSPITAL_COMMUNITY)
Admission: EM | Admit: 2017-10-20 | Discharge: 2017-10-20 | Disposition: A | Discharge status: Home / Self Care | Payer: Non-veteran care | Attending: Emergency Medicine | Admitting: Emergency Medicine

## 2017-10-20 ENCOUNTER — Other Ambulatory Visit: Payer: Self-pay

## 2017-10-20 DIAGNOSIS — F17210 Nicotine dependence, cigarettes, uncomplicated: Secondary | ICD-10-CM | POA: Diagnosis not present

## 2017-10-20 DIAGNOSIS — R2232 Localized swelling, mass and lump, left upper limb: Secondary | ICD-10-CM | POA: Diagnosis present

## 2017-10-20 DIAGNOSIS — Z23 Encounter for immunization: Secondary | ICD-10-CM | POA: Insufficient documentation

## 2017-10-20 DIAGNOSIS — L02512 Cutaneous abscess of left hand: Secondary | ICD-10-CM | POA: Insufficient documentation

## 2017-10-20 DIAGNOSIS — L02519 Cutaneous abscess of unspecified hand: Secondary | ICD-10-CM

## 2017-10-20 LAB — CBC
HCT: 39.8 % (ref 36.0–46.0)
Hemoglobin: 13.2 g/dL (ref 12.0–15.0)
MCH: 27.9 pg (ref 26.0–34.0)
MCHC: 33.2 g/dL (ref 30.0–36.0)
MCV: 84.1 fL (ref 78.0–100.0)
Platelets: 401 10*3/uL — ABNORMAL HIGH (ref 150–400)
RBC: 4.73 MIL/uL (ref 3.87–5.11)
RDW: 13.9 % (ref 11.5–15.5)
WBC: 13 10*3/uL — AB (ref 4.0–10.5)

## 2017-10-20 MED ORDER — CLINDAMYCIN HCL 150 MG PO CAPS: 450.0000 mg | ORAL_CAPSULE | Freq: Three times a day (TID) | ORAL | 0 refills | Status: AC

## 2017-10-20 MED ORDER — TETANUS-DIPHTH-ACELL PERTUSSIS 5-2.5-18.5 LF-MCG/0.5 IM SUSP
0.5000 mL | Freq: Once | INTRAMUSCULAR | Status: AC
  Administered 2017-10-20: 0.5 mL via INTRAMUSCULAR
  Filled 2017-10-20: qty 0.5

## 2017-10-20 MED ORDER — LIDOCAINE HCL (PF) 1 % IJ SOLN
5.0000 mL | Freq: Once | INTRAMUSCULAR | Status: AC
  Administered 2017-10-20: 5 mL
  Filled 2017-10-20: qty 5

## 2017-10-20 MED ORDER — CLINDAMYCIN HCL 150 MG PO CAPS
450.0000 mg | ORAL_CAPSULE | Freq: Once | ORAL | Status: AC
  Administered 2017-10-20: 450 mg via ORAL
  Filled 2017-10-20: qty 3

## 2017-10-20 NOTE — Discharge Instructions (Signed)
Please read and follow all provided instructions.  Your diagnoses today include:  1. Hand abscess     Tests performed today include: Vital signs. See below for your results today.   Medications prescribed:   Take any prescribed medications only as directed.   Home care instructions:  Follow any educational materials contained in this packet. Keep affected area above the level of your heart when possible. Wash area gently twice a day with warm soapy water. Do not apply alcohol or hydrogen peroxide. Cover the area if it draining or weeping.   Follow-up instructions: Return to the Emergency Department in 48 hours for a recheck.  Please follow-up with your primary care provider in the next 1 week for further evaluation of your symptoms.   Return instructions:  Return to the Emergency Department if you have: Fever Worsening symptoms Worsening pain Worsening swelling Redness of the skin that moves away from the affected area, especially if it streaks away from the affected area  Any other emergent concerns  Your vital signs today were: BP 124/69 (BP Location: Right Arm)    Pulse 87    Temp 98.2 F (36.8 C) (Oral)    Resp 18    Ht 5\' 8"  (1.727 m)    Wt 79.4 kg (175 lb)    SpO2 100%    BMI 26.61 kg/m  If your blood pressure (BP) was elevated above 135/85 this visit, please have this repeated by your doctor within one month. --------------

## 2017-10-20 NOTE — ED Provider Notes (Signed)
[facility] EMERGENCY DEPARTMENT Provider Note   CSN: [id number] Arrival date & time: 10/20/17  1240  History   Chief Complaint Chief Complaint  Patient presents with  . Hand Pain    HPI [name] is a 31 y.o. female.  HPI  31 y.o. female with a hx of Polysubstance Abuse, [name], presents to the Emergency Department today due to left posterior hand swelling since Sunday. Noticed small pustule initially and tried to poke it with a needle to get the pus out. Pt is a known heroin user with last use this morning. States she is unsure if she used a heroin needle or note and is unsure if it was clean or not. Notes swelling increased as well as redness. No fevers. No numbness/tingling. Pt states she is pretty sure she does not shoot up in that hand. Rates pain 6/10. Worse with palpation. Notes throbbing. No other symptoms noted   Past Medical History:  Diagnosis Date  . Ankle pain, chronic   . Anxiety   . Depression   . Depression with anxiety   . Dilated bile duct 07/2017   CBD up to 11 mm on ultrasound and MRCP.  no choledocholithiasis or gallstones.    . [name] antibody positive in blood 07/2017  . Polysubstance abuse   . PTSD (post-traumatic stress disorder)     Patient Active Problem List   Diagnosis Date Noted  . Dilated bile duct   . Abnormal LFTs (liver function tests)   . Drug intoxication with delirium (HCC)   . Polysubstance abuse (HCC)   . Rhabdomyolysis 07/15/2017  . Hypokalemia 07/15/2017  . Abnormal liver function 07/15/2017  . Chronic [name] (HCC) 03/30/2013  . Bacterial vaginosis 03/30/2013  . Pyelonephritis 03/28/2013  . Polysubstance abuse (HCC) 03/28/2013  . 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 Polysubstance dependence including opioid type drug, continuous use (HCC) 06/22/2012    Class: Chronic  . Substance induced mood disorder (HCC) 06/22/2012    Class: Chronic  . Smokes tobacco daily 01/28/2007  . DEPRESSIVE DISORDER,  NOS 01/28/2007    Past Surgical History:  Procedure Laterality Date  . ANKLE SURGERY    . APPENDECTOMY    . EUS Right 07/20/2017   Procedure: ESOPHAGEAL ENDOSCOPIC ULTRASOUND (EUS) RADIAL;  Surgeon: Jacobs, Daniel P, MD;  Location: [facility] ENDOSCOPY;  Service: Endoscopy;  Laterality: Right;  . TUBAL LIGATION     20 09  . TUBAL LIGATION      OB History    No data available       Home Medications    Prior to Admission medications   Not on File    Family History Family History  Family history unknown: Yes    Social History Social History   Tobacco Use  . Smoking status: Current Every Day Smoker    Packs/day: 0.50    Years: 16.00    Pack years: 8.00    Types: Cigarettes  . Smokeless tobacco: Never Used  Substance Use Topics  . Alcohol use: No    Comment: occasionally  . Drug use: Yes    Types: Heroin, Cocaine, Methamphetamines    Comment: heroin     Allergies   Penicillins cross reactors   Review of Systems Review of Systems ROS reviewed and all are negative for acute change except as noted in the HPI.  Physical Exam Updated Vital Signs BP 124/69 (BP Location: Right Arm)   Pulse 87   Temp 98.2 F (36.8 C) (Oral)  Resp 18   Ht 5\' 8"  (1.727 m)   Wt 79.4 kg (175 lb)   SpO2 100%   BMI 26.61 kg/m   Physical Exam  Constitutional: She is oriented to person, place, and time. Vital signs are normal. She appears well-developed and well-nourished.  HENT:  Head: Normocephalic and atraumatic.  Right Ear: Hearing normal.  Left Ear: Hearing normal.  Eyes: Conjunctivae and EOM are normal. Pupils are equal, round, and reactive to light.  Neck: Normal range of motion. Neck supple.  Cardiovascular: Normal rate, regular rhythm, normal heart sounds and intact distal pulses.  Pulmonary/Chest: Effort normal and breath sounds normal.  Musculoskeletal: Normal range of motion.  Right dorsum of hand with superficial abscess. Small pustule noted. Fluctuant on palpation.  Mild erythema and swelling. No red streaking. ROM intact. NVI  Neurological: She is alert and oriented to person, place, and time.  Skin: Skin is warm and dry.  Psychiatric: She has a normal mood and affect. Her speech is normal and behavior is normal. Thought content normal.  Nursing note and vitals reviewed.  ED Treatments / Results  Labs (all labs ordered are listed, but only abnormal results are displayed) Labs Reviewed - No data to display  EKG  EKG Interpretation None       Radiology No results found.  Procedures .Marland KitchenIncision and Drainage Date/Time: 10/20/2017 2:38 PM Performed by: Shary Decamp, PA-C Authorized by: Shary Decamp, PA-C   Consent:    Consent obtained:  Verbal   Consent given by:  Patient   Risks discussed:  Bleeding, infection, incomplete drainage and pain   Alternatives discussed:  No treatment Location:    Type:  Abscess   Size:  1.5   Location:  Upper extremity   Upper extremity location:  Hand   Hand location:  R hand Pre-procedure details:    Skin preparation:  Antiseptic wash and Betadine Anesthesia (see MAR for exact dosages):    Anesthesia method:  Local infiltration   Local anesthetic:  Lidocaine 1% w/o epi Procedure type:    Complexity:  Simple Procedure details:    Needle aspiration: no     Incision types:  Stab incision and single straight   Incision depth:  Dermal   Scalpel blade:  11   Wound management:  Probed and deloculated and irrigated with saline   Drainage:  Purulent   Drainage amount:  Moderate   Wound treatment:  Wound left open Post-procedure details:    Patient tolerance of procedure:  Tolerated well, no immediate complications   (including critical care time)  Medications Ordered in ED Medications - No data to display   Initial Impression / Assessment and Plan / ED Course  I have reviewed the triage vital signs and the nursing notes.  Pertinent labs & imaging results that were available during my care of the  patient were reviewed by me and considered in my medical decision making (see chart for details).  Final Clinical Impressions(s) / ED Diagnoses     {I have reviewed the relevant previous healthcare records.  {I obtained HPI from historian.   ED Course:  Assessment: Pt is a 31 y.o. female with a hx of Polysubstance Abuse, [name], presents to the Emergency Department today due to left posterior hand swelling since Sunday. Noticed small pustule initially and tried to poke it with a needle to get the pus out. Pt is a known heroin user with last use this morning. States she is unsure if she used a heroin  needle or note and is unsure if it was clean or not. Notes swelling increased as well as redness. No fevers. No numbness/tingling. Pt states she is pretty sure she does not shoot up in that hand. Rates pain 6/10. Worse with palpation. Notes throbbing. On exam, pt in NAD. Nontoxic/nonseptic appearing. VSS. Afebrile. Right dorsum of hand with superficial abscess. Small pustule noted. Fluctuant on palpation. Mild erythema and swelling. No red streaking. ROM intact. NVI. CBC obtained for follow up visit trending. Performed I&D with copious purulence expressed. Irrigated with saline. Plan is to DC home with follow up in ED for wound check in 48 hours given risk factors. At time of discharge, Patient is in no acute distress. Vital Signs are stable. Patient is able to ambulate. Patient able to tolerate PO.   Disposition/Plan:  DC Home Additional Verbal discharge instructions given and discussed with patient.  Pt Instructed to f/u in ED 2-3 days Return precautions given Pt acknowledges and agrees with plan  Supervising Physician Mabe, Forbes Cellar, MD  Final diagnoses:  Hand abscess    ED Discharge Orders    None       Shary Decamp, PA-C 10/20/17 1442    Marcha Dutton Forbes Cellar, MD 10/20/17 1446

## 2017-10-20 NOTE — ED Triage Notes (Addendum)
Pt has an abcess to her left posterior hand, red and swollen.  Pt reports "I don't shoot up in this hand."  "I tried poking it with a needle yesterday, but nothing came out."  Pt confirms she "shot up before arriving."  Pt is falling asleep during triage.  VSS.

## 2017-10-29 ENCOUNTER — Emergency Department (HOSPITAL_COMMUNITY)
Admission: EM | Admit: 2017-10-29 | Discharge: 2017-10-29 | Discharge status: Against Medical Advice | Payer: Non-veteran care

## 2017-10-29 NOTE — ED Notes (Signed)
Pt checked in then told registration "nevermind"

## 2017-11-21 ENCOUNTER — Emergency Department (HOSPITAL_COMMUNITY)
Admission: EM | Admit: 2017-11-21 | Discharge: 2017-11-21 | Disposition: A | Discharge status: Home / Self Care | Payer: Non-veteran care | Attending: Emergency Medicine | Admitting: Emergency Medicine

## 2017-11-21 ENCOUNTER — Other Ambulatory Visit: Payer: Self-pay

## 2017-11-21 ENCOUNTER — Encounter (HOSPITAL_COMMUNITY): Payer: Self-pay | Admitting: *Deleted

## 2017-11-21 DIAGNOSIS — L02413 Cutaneous abscess of right upper limb: Secondary | ICD-10-CM | POA: Insufficient documentation

## 2017-11-21 DIAGNOSIS — Z5321 Procedure and treatment not carried out due to patient leaving prior to being seen by health care provider: Secondary | ICD-10-CM | POA: Diagnosis not present

## 2017-11-21 NOTE — ED Notes (Signed)
Pt has walked down the hall not seen

## 2017-11-21 NOTE — ED Triage Notes (Signed)
The pt has an abscess to her rt posterior forearm  She reports  Started 2-3 days ago  She is an iv heroin and cocaine user  Last use was today  She injectedd it into the forearm abscess  Red swollen painful lmp now

## 2017-12-10 ENCOUNTER — Other Ambulatory Visit: Payer: Self-pay

## 2017-12-10 ENCOUNTER — Encounter (HOSPITAL_COMMUNITY): Payer: Self-pay | Admitting: Emergency Medicine

## 2017-12-10 ENCOUNTER — Ambulatory Visit (HOSPITAL_COMMUNITY)
Admission: EM | Admit: 2017-12-10 | Discharge: 2017-12-10 | Disposition: A | Discharge status: Home / Self Care | Payer: Non-veteran care | Attending: Family Medicine | Admitting: Family Medicine

## 2017-12-10 DIAGNOSIS — H1033 Unspecified acute conjunctivitis, bilateral: Secondary | ICD-10-CM

## 2017-12-10 MED ORDER — TOBRAMYCIN 0.3 % OP SOLN: 1.0000 [drp] | Freq: Four times a day (QID) | OPHTHALMIC | 0 refills | Status: DC

## 2017-12-10 NOTE — ED Triage Notes (Signed)
Multiple concerns.  Patient's eyes are red and tearing.  Left eye worse than right eye.  Symptoms started this morning.    Patient has had surgery on forearm for abscess.  Wants to know how it looks.  Patient missed follow up appt for reevaluation.  Patient says she is taking clindamycin  Patient also is a iv drug user.  Accidentally stuck right thumb with a needle the other night.  Right thumb is swollen and painful.

## 2017-12-15 NOTE — ED Provider Notes (Signed)
[place]   [id number] 12/10/17 Arrival Time: [place]:  1. Acute conjunctivitis of both eyes, unspecified acute conjunctivitis type     Meds ordered this encounter  Medications  . tobramycin (TOBREX) 0.3 % ophthalmic solution    Sig: Place 1 drop into the right eye every 6 (six) hours.    Dispense:  5 mL    Refill:  0   [facility]. Reviewed expectations re: course of current medical issues. Questions answered. Outlined signs and symptoms indicating need for more acute intervention. Patient verbalized understanding. After Visit Summary given.   SUBJECTIVE:  DORENE BRUNI is a 32 y.o. female who presents with complaint of persistent redness of both eyes; L>R.  Onset gradual, approximately 2 days ago. Injury: no. Visual changes: no. Contact lens use: no. Self treatment: None. No recent illnesses. Persistent drainage reported.  ROS: As per HPI.  OBJECTIVE:  Vitals:   12/10/17 1939  BP: 128/89  Pulse: 88  Resp: 20  Temp: 98.2 F (36.8 C)  TempSrc: Oral  SpO2: 97%    General appearance: alert; no distress Eyes: conjunctival irritation bilaterally; moderate clear/white thick drainage Neck: supple Lungs: clear to auscultation bilaterally Heart: regular rate and rhythm Skin: warm and dry Psychological: alert and cooperative; normal mood and affect  Allergies  Allergen Reactions  . Penicillins Cross Reactors Hives    Has patient had a PCN reaction causing immediate rash, facial/tongue/throat swelling, SOB or lightheadedness with hypotension: Yes Has patient had a PCN reaction causing severe rash involving mucus membranes or skin necrosis: Yes Has patient had a PCN reaction that required hospitalization unknown Has patient had a PCN reaction occurring within the last 10 years: unknown If all of the above answers are "NO", then may proceed with Cephalosporin use.     Past Medical History:  Diagnosis Date  . Ankle pain, chronic     . Anxiety   . Depression   . Depression with anxiety   . Dilated bile duct 07/2017   CBD up to 11 mm on ultrasound and MRCP.  no choledocholithiasis or gallstones.    . [name] antibody positive in blood 07/2017  . Polysubstance abuse ([place])   . PTSD (post-traumatic stress disorder)    Social History   Socioeconomic History  . Marital status: Single    Spouse name: Not on file  . Number of children: Not on file  . Years of education: Not on file  . Highest education level: Not on file  Social Needs  . Financial resource strain: Not on file  . Food insecurity - worry: Not on file  . Food insecurity - inability: Not on file  . Transportation needs - medical: Not on file  . Transportation needs - non-medical: Not on file  Occupational History  . Not on file  Tobacco Use  . Smoking status: Current Every Day Smoker    Packs/day: 0.50    Years: 16.00    Pack years: 8.00    Types: Cigarettes  . Smokeless tobacco: Never Used  Substance and Sexual Activity  . Alcohol use: No    Comment: occasionally  . Drug use: Yes    Types: Heroin, Cocaine, Methamphetamines    Comment: heroin  . Sexual activity: Yes    Birth control/protection: Surgical, Condom    Comment: uses condomns inconsistenty  Other Topics Concern  . Not on file  Social History Narrative   ** Merged History Encounter **  Family History  Family history unknown: Yes   Past Surgical History:  Procedure Laterality Date  . ANKLE SURGERY    . APPENDECTOMY    . EUS Right 07/20/2017   Procedure: ESOPHAGEAL ENDOSCOPIC ULTRASOUND (EUS) RADIAL;  Surgeon: Milus Banister, MD;  Location: [facility] ENDOSCOPY;  Service: Endoscopy;  Laterality: Right;  . TUBAL LIGATION     2009  . TUBAL LIGATION       Vanessa Kick, MD 12/15/17 [phone]

## 2018-03-30 ENCOUNTER — Encounter (HOSPITAL_COMMUNITY): Payer: Self-pay | Admitting: Emergency Medicine

## 2018-03-30 ENCOUNTER — Ambulatory Visit (HOSPITAL_COMMUNITY)
Admission: EM | Admit: 2018-03-30 | Discharge: 2018-03-30 | Disposition: A | Discharge status: Home / Self Care | Payer: Non-veteran care | Attending: Family Medicine | Admitting: Family Medicine

## 2018-03-30 DIAGNOSIS — L739 Follicular disorder, unspecified: Secondary | ICD-10-CM | POA: Diagnosis not present

## 2018-03-30 MED ORDER — TRIAMCINOLONE ACETONIDE 0.1 % EX CREA: 1.0000 "application " | TOPICAL_CREAM | Freq: Two times a day (BID) | CUTANEOUS | 1 refills | Status: DC

## 2018-03-30 MED ORDER — DOXYCYCLINE HYCLATE 100 MG PO CAPS: 100.0000 mg | ORAL_CAPSULE | Freq: Two times a day (BID) | ORAL | 1 refills | Status: DC

## 2018-03-30 NOTE — ED Provider Notes (Signed)
[place]    CSN: [id number] Arrival date & time: 03/30/18  1950     History   Chief Complaint Chief Complaint  Patient presents with  . Rash    HPI MARRIETTA Berry is a 32 y.o. female.   HPI Patient is here for a "rash". Initially she had a few spots on her thighs.  Note spread of her both thighs, lower abdomen, and some in her pubic area. No fever or chills.  No recent infection or illness.  No insect bites, or itching. She acknowledges that the biggest problem with these spots are that she "picks" at them.  She cannot stand the bump, thinks it is a pimple, tries to pop it, and scratches and wounds open. She is actively an IV drug abuser.  Many needle tracks in both hands and arms. Past Medical History:  Diagnosis Date  . Ankle pain, chronic   . Anxiety   . Depression   . Depression with anxiety   . Dilated bile duct 07/2017   CBD up to 11 mm on ultrasound and MRCP.  no choledocholithiasis or gallstones.    . [name] antibody positive in blood 07/2017  . Polysubstance abuse ([place])   . PTSD (post-traumatic stress disorder)     Patient Active Problem List   Diagnosis Date Noted  . Dilated bile duct   . Abnormal LFTs (liver function tests)   . Drug intoxication with delirium ([place])   . Polysubstance abuse ([place])   . Rhabdomyolysis 07/15/2017  . Hypokalemia 07/15/2017  . Abnormal liver function 07/15/2017  . Chronic [name] ([place]) 03/30/2013  . Bacterial vaginosis 03/30/2013  . Pyelonephritis 03/28/2013  . Polysubstance abuse ([place]) 03/28/2013  . 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 Polysubstance dependence including opioid type drug, continuous use ([place]) 06/22/2012    Class: Chronic  . Substance induced mood disorder ([place]) 06/22/2012    Class: Chronic  . Smokes tobacco daily 01/28/2007  . DEPRESSIVE DISORDER, NOS 01/28/2007    Past Surgical History:  Procedure Laterality Date  . ANKLE SURGERY    . APPENDECTOMY    . EUS Right 07/20/2017   Procedure:  ESOPHAGEAL ENDOSCOPIC ULTRASOUND (EUS) RADIAL;  Surgeon: Milus Banister, MD;  Location[facility] ENDOSCOPY;  Service: Endoscopy;  Laterality: Right;  . TUBAL LIGATION     2009  . TUBAL LIGATION      OB History   None      Home Medications    Prior to Admission medications   Medication Sig Start Date End Date Taking? Authorizing Provider  doxycycline (VIBRAMYCIN) 100 MG capsule Take 1 capsule (100 mg total) by mouth 2 (two) times daily. 03/30/18   Raylene Everts, MD  triamcinolone cream (KENALOG) 0.1 % Apply 1 application topically 2 (two) times daily. 03/30/18   Raylene Everts, MD    Family History Family History  Family history unknown: Yes    Social History Social History   Tobacco Use  . Smoking status: Current Every Day Smoker    Packs/day: 0.50    Years: 16.00    Pack years: 8.00    Types: Cigarettes  . Smokeless tobacco: Never Used  Substance Use Topics  . Alcohol use: No    Comment: occasionally  . Drug use: Yes    Types: Heroin, Cocaine, Methamphetamines    Comment: heroin     Allergies   Penicillins cross reactors   Review of Systems Review of Systems  Constitutional: Negative for chills and fever.  HENT:  Positive for congestion. Negative for ear pain and sore throat.        Mild allergies  Eyes: Negative for pain and visual disturbance.  Respiratory: Negative for cough and shortness of breath.   Cardiovascular: Negative for chest pain and palpitations.  Gastrointestinal: Negative for abdominal pain and vomiting.  Genitourinary: Negative for dysuria and hematuria.  Musculoskeletal: Negative for arthralgias and back pain.  Skin: Positive for rash and wound. Negative for color change.  Neurological: Negative for seizures and syncope.  Psychiatric/Behavioral:       Active substance abuse  All other systems reviewed and are negative.    Physical Exam Triage Vital Signs ED Triage Vitals [03/30/18 2011]  Enc Vitals Group     BP 124/85      Pulse Rate (!) 106     Resp 18     Temp 98.2 F (36.8 C)     Temp src      SpO2 95 %     Weight      Height      Head Circumference      Peak Flow      Pain Score      Pain Loc      Pain Edu?      Excl. in [place]?    No data found.  Updated Vital Signs BP 124/85   Pulse (!) 106   Temp 98.2 F (36.8 C)   Resp 18   SpO2 95%   Visual Acuity Right Eye Distance:   Left Eye Distance:   Bilateral Distance:    Right Eye Near:   Left Eye Near:    Bilateral Near:     Physical Exam  Constitutional: She appears well-developed and well-nourished. She appears distressed.  Mild distress.  Perspiration upper lip  HENT:  Head: Normocephalic and atraumatic.  Eyes: Conjunctivae are normal.  Neck: Neck supple.  Cardiovascular: Normal rate and regular rhythm.  No murmur heard. Pulmonary/Chest: Effort normal and breath sounds normal. No respiratory distress.  Abdominal: Soft. There is no tenderness.  Musculoskeletal: She exhibits no edema.       Legs: Neurological: She is alert.  Skin: Skin is warm. Rash noted. She is diaphoretic.  Psychiatric: She has a normal mood and affect.  Nursing note and vitals reviewed.    UC Treatments / Results    Initial Impression / Assessment and Plan / UC Course  I have reviewed the triage vital signs and the nursing notes.  Pertinent labs & imaging results that were available during my care of the patient were reviewed by me and considered in my medical decision making (see chart for details).     Discussed with patient folliculitis.  Cleaning with antibacterial soap.  PRN antibiotics.  I did give her a cortisone cream to take down the itching.  I am trying to impress upon her the importance of keeping her hands off of the rash.  Discussed scarring if she continues to pick these wounds open.  Discussed her polysubstance abuse.  IV drug abuse.  She gets medical care through the [place].  I reminded her that there is a [place] now encouraged.  She needs to  find a PCP there to go regularly.  Offered her referral to local Suboxone clinic.  Patient declines. Final Clinical Impressions(s) / UC Diagnoses   Final diagnoses:  Folliculitis     Discharge Instructions     Wash skin with antibacterial soap or chlorhexidine (OTC) Take the antibiotic for 10 days Repeat  if needed Use the cream for the itching [facility]     ED Prescriptions    Medication Sig Dispense Auth. Provider   doxycycline (VIBRAMYCIN) 100 MG capsule Take 1 capsule (100 mg total) by mouth 2 (two) times daily. 20 capsule Raylene Everts, MD   triamcinolone cream (KENALOG) 0.1 % Apply 1 application topically 2 (two) times daily. 30 g Raylene Everts, MD     Controlled Substance Prescriptions [place] Controlled Substance Registry consulted? Not Applicable   Raylene Everts, MD 03/30/18 2045

## 2018-03-30 NOTE — ED Triage Notes (Signed)
Pt c/o rash all over bilateral legs, pt states she shoots up heroin and cocaine. Pt worried about staph infection. Went to the health dept for std check, unsure of results.

## 2018-03-30 NOTE — Discharge Instructions (Signed)
Wash skin with antibacterial soap or chlorhexidine (OTC) Take the antibiotic for 10 days Repeat if needed Use the cream for the itching STOP PICKING

## 2018-05-07 ENCOUNTER — Encounter (HOSPITAL_COMMUNITY): Payer: Self-pay | Admitting: Emergency Medicine

## 2018-05-07 ENCOUNTER — Ambulatory Visit (HOSPITAL_COMMUNITY)
Admission: EM | Admit: 2018-05-07 | Discharge: 2018-05-07 | Disposition: A | Discharge status: Home / Self Care | Payer: Non-veteran care | Attending: Family Medicine | Admitting: Family Medicine

## 2018-05-07 DIAGNOSIS — Z9889 Other specified postprocedural states: Secondary | ICD-10-CM | POA: Insufficient documentation

## 2018-05-07 DIAGNOSIS — F4310 Post-traumatic stress disorder, unspecified: Secondary | ICD-10-CM | POA: Insufficient documentation

## 2018-05-07 DIAGNOSIS — Z9851 Tubal ligation status: Secondary | ICD-10-CM | POA: Insufficient documentation

## 2018-05-07 DIAGNOSIS — B9689 Other specified bacterial agents as the cause of diseases classified elsewhere: Secondary | ICD-10-CM | POA: Insufficient documentation

## 2018-05-07 DIAGNOSIS — F17210 Nicotine dependence, cigarettes, uncomplicated: Secondary | ICD-10-CM | POA: Insufficient documentation

## 2018-05-07 DIAGNOSIS — N760 Acute vaginitis: Secondary | ICD-10-CM | POA: Insufficient documentation

## 2018-05-07 DIAGNOSIS — F418 Other specified anxiety disorders: Secondary | ICD-10-CM | POA: Insufficient documentation

## 2018-05-07 DIAGNOSIS — Z79899 Other long term (current) drug therapy: Secondary | ICD-10-CM | POA: Insufficient documentation

## 2018-05-07 DIAGNOSIS — Z880 Allergy status to penicillin: Secondary | ICD-10-CM | POA: Insufficient documentation

## 2018-05-07 DIAGNOSIS — Z590 Homelessness: Secondary | ICD-10-CM | POA: Insufficient documentation

## 2018-05-07 LAB — POCT URINALYSIS DIP (DEVICE)
Bilirubin Urine: NEGATIVE
Glucose, UA: NEGATIVE mg/dL
HGB URINE DIPSTICK: NEGATIVE
Ketones, ur: NEGATIVE mg/dL
LEUKOCYTES UA: NEGATIVE
NITRITE: NEGATIVE
Protein, ur: NEGATIVE mg/dL
UROBILINOGEN UA: 0.2 mg/dL (ref 0.0–1.0)
pH: 6 (ref 5.0–8.0)

## 2018-05-07 LAB — POCT PREGNANCY, URINE: Preg Test, Ur: NEGATIVE

## 2018-05-07 MED ORDER — METRONIDAZOLE 500 MG PO TABS: 500.0000 mg | ORAL_TABLET | Freq: Two times a day (BID) | ORAL | 0 refills | Status: DC

## 2018-05-07 NOTE — Discharge Instructions (Addendum)
We did lab testing during this visit.  If there are any abnormal findings that require change in medicine or indicate a positive result, you will be notified.  If all of your tests are normal, you will not be called.   Take the antibiotic for 7 days Return for problems

## 2018-05-07 NOTE — ED Triage Notes (Signed)
Pt c/o mild cramping, abdominal pain, discharge x1 week.

## 2018-05-07 NOTE — ED Provider Notes (Signed)
[place]    CSN: [id number] Arrival date & time: 05/07/18  1945     History   Chief Complaint Chief Complaint  Patient presents with  . Vaginal Discharge  No abdominal pain.  No fever chills.  HPI [name] is a 32 y.o. female.   HPI  Patient is here to be evaluated for vaginitis.  She has vaginal discharge and odor.  Mild dysuria.  No frequency or bladder symptoms.  Patient has a steady boyfriend.  She is not certain that he is trustworthy.  She like to be tested for STDs.  No fever, no chills, no rash.  No lesions. Patient states she still using drugs.  She is still homeless.  Past Medical History:  Diagnosis Date  . Ankle pain, chronic   . Anxiety   . Depression   . Depression with anxiety   . Dilated bile duct 07/2017   CBD up to 11 mm on ultrasound and MRCP.  no choledocholithiasis or gallstones.    . [name] antibody positive in blood 07/2017  . Polysubstance abuse ([place])   . PTSD (post-traumatic stress disorder)     Patient Active Problem List   Diagnosis Date Noted  . Dilated bile duct   . Abnormal LFTs (liver function tests)   . Drug intoxication with delirium ([place])   . Polysubstance abuse ([place])   . Rhabdomyolysis 07/15/2017  . Hypokalemia 07/15/2017  . Abnormal liver function 07/15/2017  . Chronic [name] ([place]) 03/30/2013  . Bacterial vaginosis 03/30/2013  . Pyelonephritis 03/28/2013  . Polysubstance abuse ([place]) 03/28/2013  . 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 Polysubstance dependence including opioid type drug, continuous use ([place]) 06/22/2012    Class: Chronic  . Substance induced mood disorder ([place]) 06/22/2012    Class: Chronic  . Smokes tobacco daily 01/28/2007  . DEPRESSIVE DISORDER, NOS 01/28/2007    Past Surgical History:  Procedure Laterality Date  . ANKLE SURGERY    . APPENDECTOMY    . EUS Right 07/20/2017   Procedure: ESOPHAGEAL ENDOSCOPIC ULTRASOUND (EUS) RADIAL;  Surgeon: Milus Banister, MD;  Location: [facility]  ENDOSCOPY;  Service: Endoscopy;  Laterality: Right;  . TUBAL LIGATION     2009  . TUBAL LIGATION      OB History   None      Home Medications    Prior to Admission medications   Medication Sig Start Date End Date Taking? Authorizing Provider  metroNIDAZOLE (FLAGYL) 500 MG tablet Take 1 tablet (500 mg total) by mouth 2 (two) times daily. 05/07/18   Raylene Everts, MD  triamcinolone cream (KENALOG) 0.1 % Apply 1 application topically 2 (two) times daily. 03/30/18   Raylene Everts, MD    Family History Family History  Family history unknown: Yes    Social History Social History   Tobacco Use  . Smoking status: Current Every Day Smoker    Packs/day: 0.50    Years: 16.00    Pack years: 8.00    Types: Cigarettes  . Smokeless tobacco: Never Used  Substance Use Topics  . Alcohol use: No    Comment: occasionally  . Drug use: Yes    Types: Heroin, Cocaine, Methamphetamines    Comment: heroin     Allergies   Penicillins cross reactors   Review of Systems Review of Systems  Constitutional: Negative for chills and fever.  HENT: Negative for ear pain and sore throat.   Eyes: Negative for pain and visual disturbance.  Respiratory: Negative for  cough and shortness of breath.   Cardiovascular: Negative for chest pain and palpitations.  Gastrointestinal: Negative for abdominal pain and vomiting.  Genitourinary: Positive for dysuria and vaginal discharge. Negative for hematuria.  Musculoskeletal: Negative for arthralgias and back pain.  Skin: Negative for color change and rash.  Neurological: Negative for seizures and syncope.  All other systems reviewed and are negative.    Physical Exam Triage Vital Signs ED Triage Vitals [05/07/18 1951]  Enc Vitals Group     BP 133/84     Pulse Rate 96     Resp 18     Temp 99.2 F (37.3 C)     Temp src      SpO2 100 %     Weight      Height      Head Circumference      Peak Flow      Pain Score      Pain Loc       Pain Edu?      Excl. in [place]?    No data found.  Updated Vital Signs BP 133/84   Pulse 96   Temp 99.2 F (37.3 C)   Resp 18   SpO2 100%   Visual Acuity Right Eye Distance:   Left Eye Distance:   Bilateral Distance:    Right Eye Near:   Left Eye Near:    Bilateral Near:     Physical Exam  Constitutional: She appears well-developed and well-nourished. No distress.  HENT:  Head: Normocephalic and atraumatic.  Mouth/Throat: Oropharynx is clear and moist.  Eyes: Pupils are equal, round, and reactive to light. Conjunctivae are normal.  Neck: Normal range of motion.  Cardiovascular: Normal rate.  Pulmonary/Chest: Effort normal. No respiratory distress.  Abdominal: Soft. She exhibits no distension.  Musculoskeletal: Normal range of motion. She exhibits no edema.  Neurological: She is alert.  Skin: Skin is warm and dry.     UC Treatments / Results  Labs (all labs ordered are listed, but only abnormal results are displayed) Labs Reviewed  POCT URINALYSIS DIP (DEVICE)  POCT PREGNANCY, URINE  URINE CYTOLOGY ANCILLARY ONLY    EKG None  Radiology No results found.  Procedures Procedures (including critical care time)  Medications Ordered in UC Medications - No data to display  Initial Impression / Assessment and Plan / UC Course  I have reviewed the triage vital signs and the nursing notes.  Pertinent labs & imaging results that were available during my care of the patient were reviewed by me and considered in my medical decision making (see chart for details).     *Discussed lab testing available for STD.  She declines need for RPR and HIV.  She does want to be tested for the full plan will of urine cytology.  We will treat her for BV.  We will treat her for any additional findings was culture test results are available. Final Clinical Impressions(s) / UC Diagnoses   Final diagnoses:  Bacterial vaginosis     Discharge Instructions     We did lab testing  during this visit.  If there are any abnormal findings that require change in medicine or indicate a positive result, you will be notified.  If all of your tests are normal, you will not be called.   Take the antibiotic for 7 days Return for problems   ED Prescriptions    Medication Sig Dispense Auth. Provider   metroNIDAZOLE (FLAGYL) 500 MG tablet Take 1 tablet (500  mg total) by mouth 2 (two) times daily. 14 tablet Raylene Everts, MD     Controlled Substance Prescriptions [place] Controlled Substance Registry consulted? Not Applicable   Raylene Everts, MD 05/07/18 2023

## 2018-05-10 LAB — URINE CYTOLOGY ANCILLARY ONLY
Chlamydia: NEGATIVE
NEISSERIA GONORRHEA: NEGATIVE
TRICH (WINDOWPATH): NEGATIVE

## 2018-05-14 ENCOUNTER — Telehealth (HOSPITAL_COMMUNITY): Payer: Self-pay

## 2018-05-14 LAB — URINE CYTOLOGY ANCILLARY ONLY: CANDIDA VAGINITIS: NEGATIVE

## 2018-05-14 NOTE — Telephone Encounter (Signed)
Bacterial Vaginosis test is positive.  Prescription for metronidazole was given at the urgent care visit. Attempted to reach patient. No answer at this time. Voicemail left.

## 2018-09-20 ENCOUNTER — Ambulatory Visit (HOSPITAL_COMMUNITY)
Admission: EM | Admit: 2018-09-20 | Discharge: 2018-09-20 | Discharge status: Absent without leave | Payer: Non-veteran care

## 2018-09-23 ENCOUNTER — Encounter (HOSPITAL_COMMUNITY): Payer: Self-pay | Admitting: Emergency Medicine

## 2018-09-23 ENCOUNTER — Ambulatory Visit (HOSPITAL_COMMUNITY)
Admission: EM | Admit: 2018-09-23 | Discharge: 2018-09-23 | Disposition: A | Discharge status: Home / Self Care | Payer: Non-veteran care | Attending: Family Medicine | Admitting: Family Medicine

## 2018-09-23 DIAGNOSIS — N760 Acute vaginitis: Secondary | ICD-10-CM | POA: Insufficient documentation

## 2018-09-23 DIAGNOSIS — F17210 Nicotine dependence, cigarettes, uncomplicated: Secondary | ICD-10-CM | POA: Insufficient documentation

## 2018-09-23 DIAGNOSIS — F1994 Other psychoactive substance use, unspecified with psychoactive substance-induced mood disorder: Secondary | ICD-10-CM | POA: Insufficient documentation

## 2018-09-23 DIAGNOSIS — B9689 Other specified bacterial agents as the cause of diseases classified elsewhere: Secondary | ICD-10-CM | POA: Insufficient documentation

## 2018-09-23 DIAGNOSIS — E876 Hypokalemia: Secondary | ICD-10-CM | POA: Insufficient documentation

## 2018-09-23 DIAGNOSIS — N898 Other specified noninflammatory disorders of vagina: Secondary | ICD-10-CM | POA: Diagnosis not present

## 2018-09-23 DIAGNOSIS — Z113 Encounter for screening for infections with a predominantly sexual mode of transmission: Secondary | ICD-10-CM

## 2018-09-23 DIAGNOSIS — Z880 Allergy status to penicillin: Secondary | ICD-10-CM | POA: Insufficient documentation

## 2018-09-23 DIAGNOSIS — Z202 Contact with and (suspected) exposure to infections with a predominantly sexual mode of transmission: Secondary | ICD-10-CM | POA: Diagnosis not present

## 2018-09-23 DIAGNOSIS — M6282 Rhabdomyolysis: Secondary | ICD-10-CM | POA: Insufficient documentation

## 2018-09-23 DIAGNOSIS — B182 Chronic viral hepatitis C: Secondary | ICD-10-CM | POA: Insufficient documentation

## 2018-09-23 DIAGNOSIS — F418 Other specified anxiety disorders: Secondary | ICD-10-CM | POA: Insufficient documentation

## 2018-09-23 DIAGNOSIS — R109 Unspecified abdominal pain: Secondary | ICD-10-CM

## 2018-09-23 DIAGNOSIS — F4310 Post-traumatic stress disorder, unspecified: Secondary | ICD-10-CM | POA: Insufficient documentation

## 2018-09-23 LAB — POCT URINALYSIS DIP (DEVICE)
Bilirubin Urine: NEGATIVE
GLUCOSE, UA: NEGATIVE mg/dL
Hgb urine dipstick: NEGATIVE
Ketones, ur: NEGATIVE mg/dL
LEUKOCYTES UA: NEGATIVE
NITRITE: NEGATIVE
PROTEIN: NEGATIVE mg/dL
Specific Gravity, Urine: >=1.03 (ref 1.005–1.030)
UROBILINOGEN UA: 0.2 mg/dL (ref 0.0–1.0)
pH: 5.5 (ref 5.0–8.0)

## 2018-09-23 IMAGING — CT CT HEAD W/O CM
4 series · 16 of 47 positions shown, 18 images · non-contrast
Comparison: None.

CLINICAL DATA: Bizarre behavior beginning last night, pinpoint
pupils. Last used heroin and crack cocaine this morning.

EXAM:
CT HEAD WITHOUT CONTRAST
TECHNIQUE: Contiguous axial images were obtained from the base of the skull
through the vertex without intravenous contrast.

[Series 3: head wo · axial · 0.44mm/px · z∈[+1736,+1862]mm · 7 of 35 slices shown, 9 images]
[im 5/35  brain]
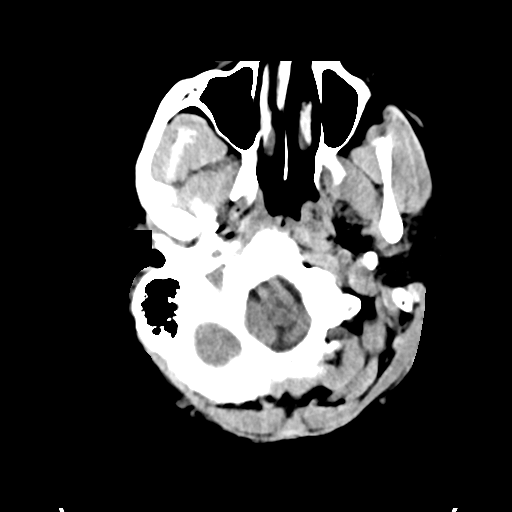
[im 5/35  bone]
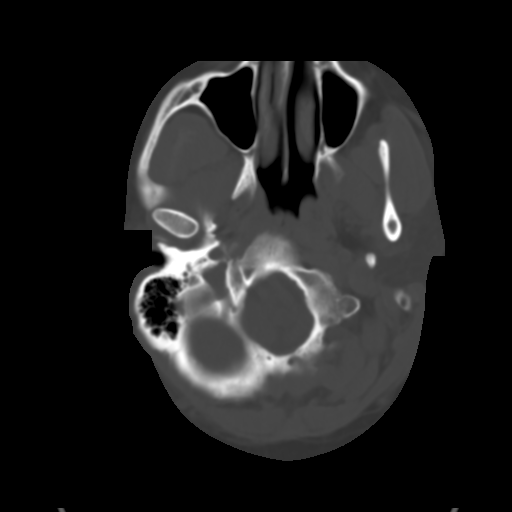
[im 9/35  brain]
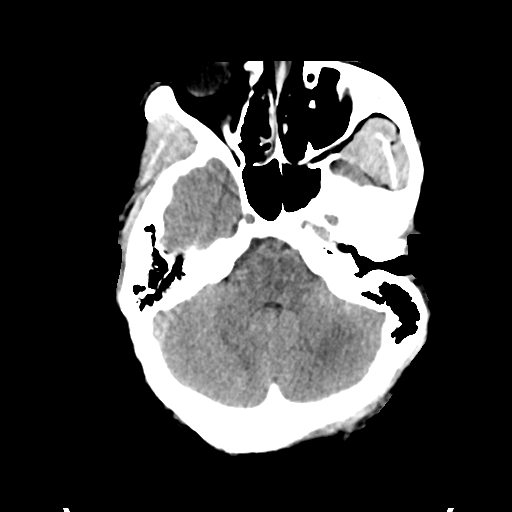
[im 13/35  brain]
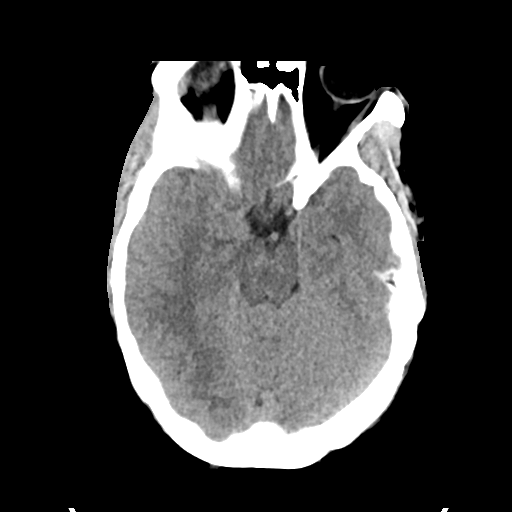
[im 18/35  brain]
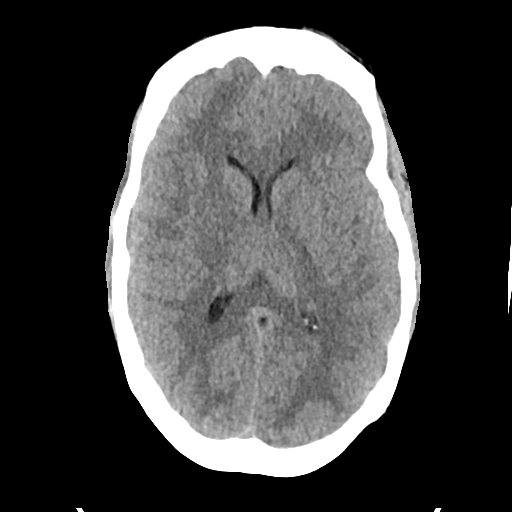
[im 22/35  brain]
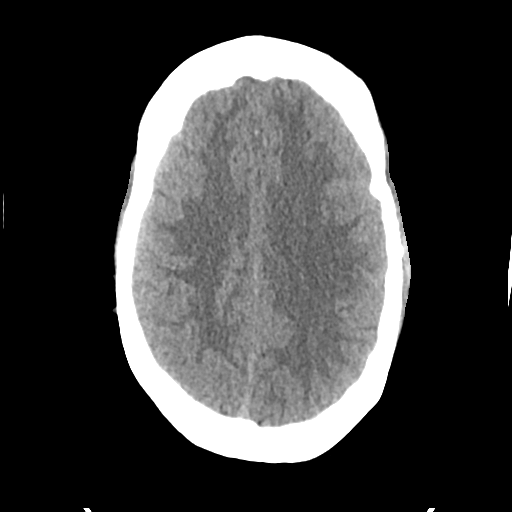
[im 22/35  bone]
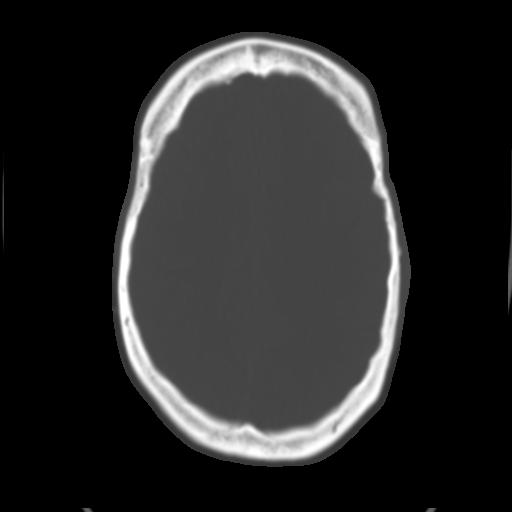
[im 26/35  brain]
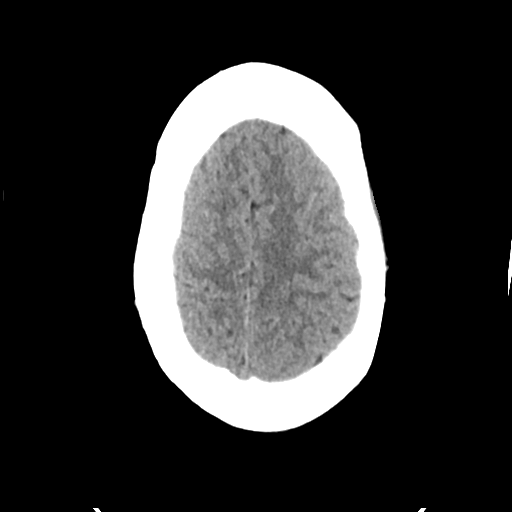
[im 30/35  brain]
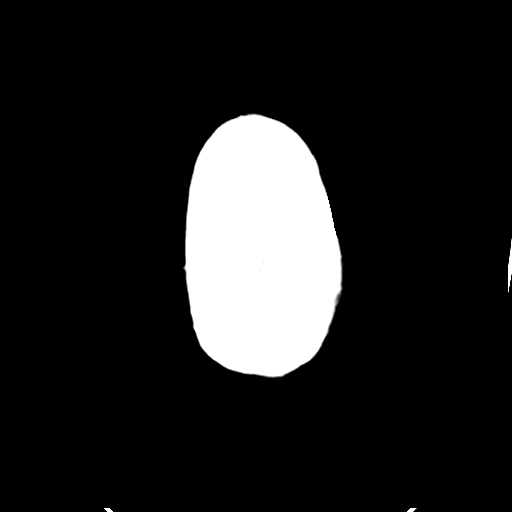

[Series 4: head bone · axial · 0.44mm/px · z∈[+1732,+1766]mm · 3 of 86 slices shown]
[im 9/86  bone]
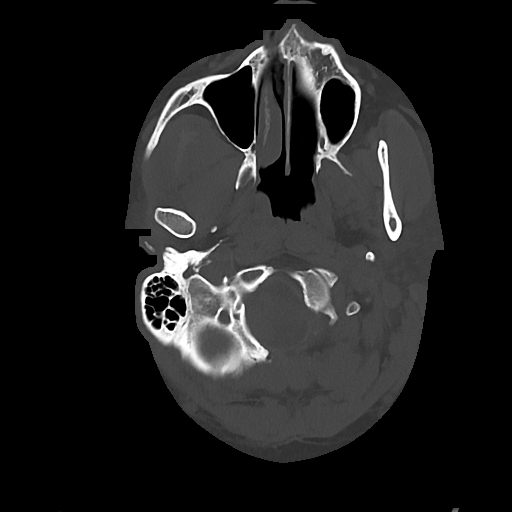
[im 18/86  bone]
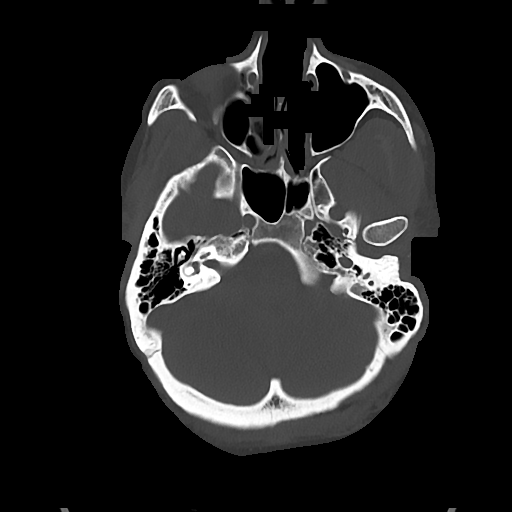
[im 26/86  bone]
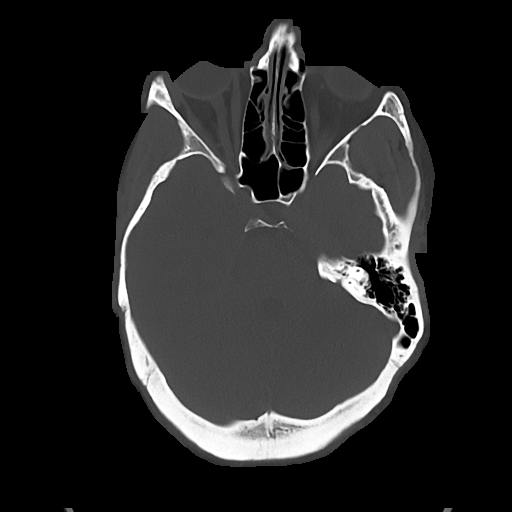

[Series 5: cor soft · coronal · 0.33mm/px · 3 of 74 slices shown]
[im 25/74  brain]
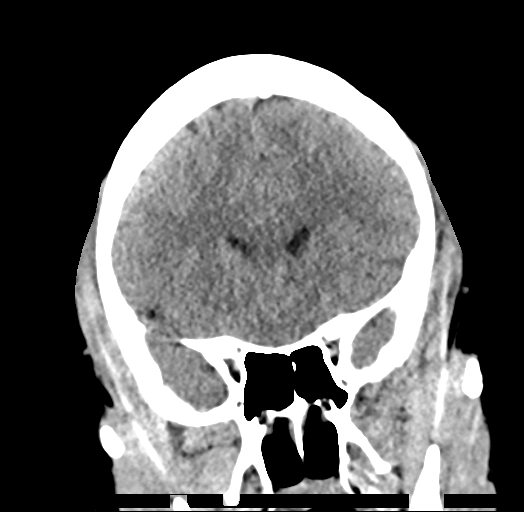
[im 33/74  brain]
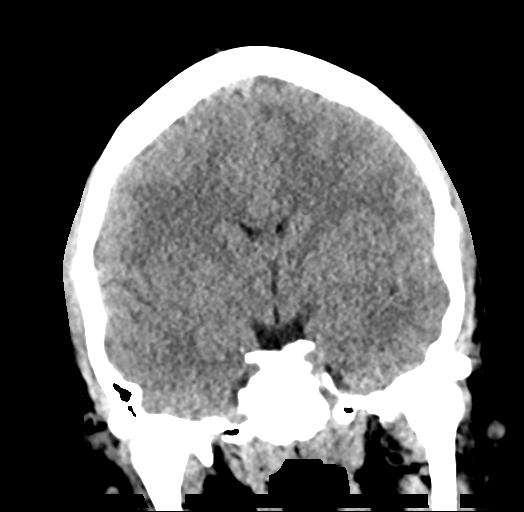
[im 41/74  brain]
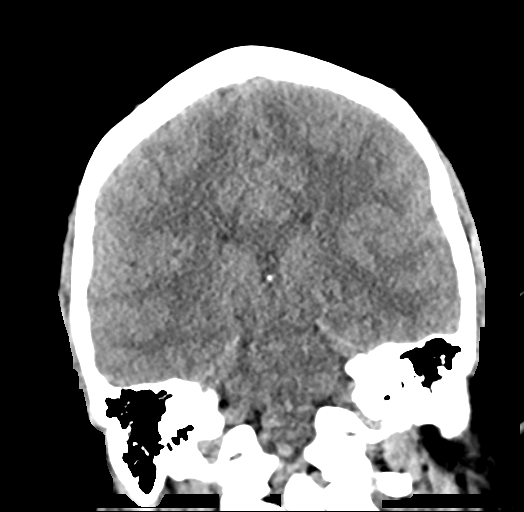

[Series 6: sag soft · sagittal · 0.33mm/px · 3 of 57 slices shown]
[im 20/57  brain]
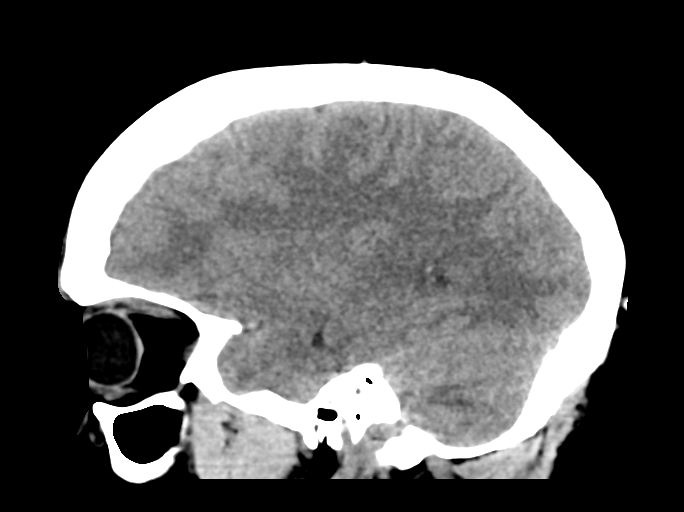
[im 29/57  brain]
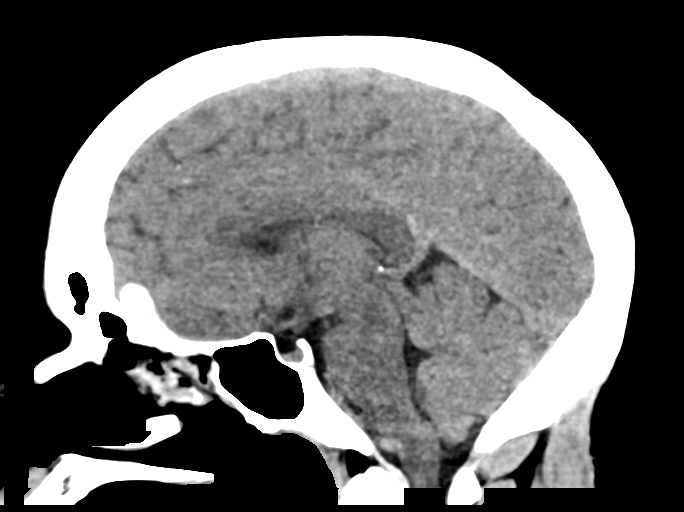
[im 38/57  brain]
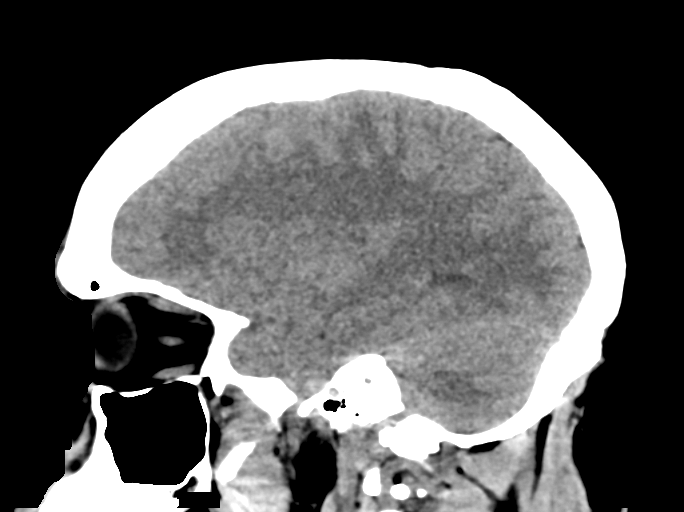

[16 of 47 positions shown; findings below may reference images not displayed]

FINDINGS: BRAIN: No intraparenchymal hemorrhage, mass effect nor midline
shift. The ventricles and sulci are normal. No acute large vascular
territory infarcts. No abnormal extra-axial fluid collections.
Punctate anterior falx calcifications. Basal cisterns are patent.

VASCULAR: Unremarkable.

SKULL/SOFT TISSUES: No skull fracture. No significant soft tissue
swelling.

ORBITS/SINUSES: The included ocular globes and orbital contents are
normal.Trace paranasal sinus mucosal thickening without air-fluid
levels. Mastoid air cells are well aerated. Mastoid aircells and
included paranasal sinuses are well-aerated.

OTHER: None.
IMPRESSION: Normal noncontrast CT HEAD.

## 2018-09-23 MED ORDER — AZITHROMYCIN 250 MG PO TABS
1000.0000 mg | ORAL_TABLET | Freq: Once | ORAL | Status: AC
  Administered 2018-09-23: 1000 mg via ORAL

## 2018-09-23 MED ORDER — CEFTRIAXONE SODIUM 250 MG IJ SOLR
250.0000 mg | Freq: Once | INTRAMUSCULAR | Status: AC
  Administered 2018-09-23: 250 mg via INTRAMUSCULAR

## 2018-09-23 MED ORDER — AZITHROMYCIN 250 MG PO TABS
ORAL_TABLET | ORAL | Status: AC
  Filled 2018-09-23: qty 4

## 2018-09-23 MED ORDER — CEFTRIAXONE SODIUM 250 MG IJ SOLR
INTRAMUSCULAR | Status: AC
  Filled 2018-09-23: qty 250

## 2018-09-23 MED ORDER — METRONIDAZOLE 500 MG PO TABS: 500.0000 mg | ORAL_TABLET | Freq: Two times a day (BID) | ORAL | 0 refills | Status: AC

## 2018-09-23 NOTE — ED Triage Notes (Signed)
PT reports vaginal odor and discharge for 1 week.

## 2018-09-23 NOTE — ED Provider Notes (Signed)
[place]    CSN: [id number] Arrival date & time: 09/23/18  1851     History   Chief Complaint Chief Complaint  Patient presents with  . Vaginal Discharge    HPI Lucie AZELYN BATIE is a 32 y.o. female history of polysubstance abuse, IV drug use, depression, presenting today for evaluation of vaginal discharge and odor.  Patient states that over the past week she has had discharge and odor.  States this this developed after unprotected intercourse with a new partner.  She has had some occasional abdominal cramping.  Mom nausea, denies vomiting and fevers.  States that she has a history of BV and has had some irritation.  Denies itching.  Patient has IUD in place for birth control, unsure of last menstrual period.  Patient is requesting empiric treatment today for STDs.  HPI  Past Medical History:  Diagnosis Date  . Ankle pain, chronic   . Anxiety   . Depression   . Depression with anxiety   . Dilated bile duct 07/2017   CBD up to 11 mm on ultrasound and MRCP.  no choledocholithiasis or gallstones.    . [name] antibody positive in blood 07/2017  . Polysubstance abuse ([place])   . PTSD (post-traumatic stress disorder)     Patient Active Problem List   Diagnosis Date Noted  . Dilated bile duct   . Abnormal LFTs (liver function tests)   . Drug intoxication with delirium ([place])   . Polysubstance abuse ([place])   . Rhabdomyolysis 07/15/2017  . Hypokalemia 07/15/2017  . Abnormal liver function 07/15/2017  . Chronic [name] ([place]) 03/30/2013  . Bacterial vaginosis 03/30/2013  . Pyelonephritis 03/28/2013  . Polysubstance abuse ([place]) 03/28/2013  . 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 Polysubstance dependence including opioid type drug, continuous use ([place]) 06/22/2012    Class: Chronic  . Substance induced mood disorder ([place]) 06/22/2012    Class: Chronic  . Smokes tobacco daily 01/28/2007  . DEPRESSIVE DISORDER, NOS 01/28/2007    Past Surgical History:  Procedure  Laterality Date  . ANKLE SURGERY    . APPENDECTOMY    . EUS Right 07/20/2017   Procedure: ESOPHAGEAL ENDOSCOPIC ULTRASOUND (EUS) RADIAL;  Surgeon: Milus Banister, MD;  Location: [facility] ENDOSCOPY;  Service: Endoscopy;  Laterality: Right;  . TUBAL LIGATION     2009  . TUBAL LIGATION      OB History   None      Home Medications    Prior to Admission medications   Medication Sig Start Date End Date Taking? Authorizing Provider  metroNIDAZOLE (FLAGYL) 500 MG tablet Take 1 tablet (500 mg total) by mouth 2 (two) times daily for 7 days. 09/23/18 09/30/18  Wieters, Hallie C, PA-C  triamcinolone cream (KENALOG) 0.1 % Apply 1 application topically 2 (two) times daily. 03/30/18   Raylene Everts, MD    Family History Family History  Family history unknown: Yes    Social History Social History   Tobacco Use  . Smoking status: Current Every Day Smoker    Packs/day: 0.50    Years: 16.00    Pack years: 8.00    Types: Cigarettes  . Smokeless tobacco: Never Used  Substance Use Topics  . Alcohol use: No    Comment: occasionally  . Drug use: Yes    Types: Heroin, Cocaine, Methamphetamines    Comment: heroin     Allergies   Penicillins cross reactors   Review of Systems Review of Systems  Constitutional: Negative for  fever.  Respiratory: Negative for shortness of breath.   Cardiovascular: Negative for chest pain.  Gastrointestinal: Positive for abdominal pain. Negative for diarrhea, nausea and vomiting.  Genitourinary: Positive for vaginal discharge. Negative for dysuria, flank pain, genital sores, hematuria, menstrual problem, vaginal bleeding and vaginal pain.  Musculoskeletal: Negative for back pain.  Skin: Negative for rash.  Neurological: Negative for dizziness, light-headedness and headaches.     Physical Exam Triage Vital Signs ED Triage Vitals  Enc Vitals Group     BP 09/23/18 1901 135/80     Pulse Rate 09/23/18 1901 82     Resp 09/23/18 1901 16     Temp  09/23/18 1901 97.9 F (36.6 C)     Temp Source 09/23/18 1901 Oral     SpO2 09/23/18 1901 98 %     Weight --      Height --      Head Circumference --      Peak Flow --      Pain Score 09/23/18 1902 4     Pain Loc --      Pain Edu? --      Excl. in [place]? --    No data found.  Updated Vital Signs BP 135/80   Pulse 82   Temp 97.9 F (36.6 C) (Oral)   Resp 16   SpO2 98%   Visual Acuity Right Eye Distance:   Left Eye Distance:   Bilateral Distance:    Right Eye Near:   Left Eye Near:    Bilateral Near:     Physical Exam  Constitutional: She appears well-developed and well-nourished. No distress.  HENT:  Head: Normocephalic and atraumatic.  Eyes: Conjunctivae are normal.  Neck: Neck supple.  Cardiovascular: Normal rate and regular rhythm.  No murmur heard. Pulmonary/Chest: Effort normal and breath sounds normal. No respiratory distress.  Abdominal: Soft. There is no tenderness.  Abdomen soft, nondistended, nontender to light deep palpation throughout all 4 quadrants and suprapubic area  Musculoskeletal: She exhibits no edema.  Neurological: She is alert.  Skin: Skin is warm and dry.  Psychiatric: She has a normal mood and affect.  Nursing note and vitals reviewed.    UC Treatments / Results  Labs (all labs ordered are listed, but only abnormal results are displayed) Labs Reviewed  HIV ANTIBODY (ROUTINE TESTING W REFLEX)  RPR  POCT URINALYSIS DIP (DEVICE)  CERVICOVAGINAL ANCILLARY ONLY    EKG None  Radiology No results found.  Procedures Procedures (including critical care time)  Medications Ordered in UC Medications  azithromycin (ZITHROMAX) tablet 1,000 mg (1,000 mg Oral Given 09/23/18 1930)  cefTRIAXone (ROCEPHIN) injection 250 mg (250 mg Intramuscular Given 09/23/18 1929)    Initial Impression / Assessment and Plan / UC Course  I have reviewed the triage vital signs and the nursing notes.  Pertinent labs & imaging results that were available  during my care of the patient were reviewed by me and considered in my medical decision making (see chart for details).     Vaginal swab obtained to send off to check for GC/chlamydia/trichomonas, yeast and BV.  Will provide empiric treatment today with Rocephin and azithromycin.  Patient has previously tolerated Rocephin without hives or allergic reaction.  HIV and RPR attempted to draw, patient had poor veins, 4 attempts without success.  Advised patient to go to health department for this; discussed with patient she has high risk for these and her last check appears to be over 1 year ago..  We  will also treat for BV with metronidazole.Discussed strict return precautions. Patient verbalized understanding and is agreeable with plan.  Final Clinical Impressions(s) / UC Diagnoses   Final diagnoses:  Vaginal discharge     Discharge Instructions     We have treated you today for gonorrhea and chlamydia, with rocephin and azithromycin. Please refrain from sexual intercourse for 7 days while medicines eliminating infection. Begin metronidazole twice daily for 1 week for Bv. Do not drink alcohol until finishing last tablet.   We are testing you for Gonorrhea, Chlamydia, Trichomonas, Yeast and Bacterial Vaginosis. We will call you if anything is positive and let you know if you require any further treatment. Please inform partners of any positive results.   Please return if symptoms not improving with treatment, development of fever, nausea, vomiting, abdominal pain.    ED Prescriptions    Medication Sig Dispense Auth. Provider   metroNIDAZOLE (FLAGYL) 500 MG tablet Take 1 tablet (500 mg total) by mouth 2 (two) times daily for 7 days. 14 tablet Wieters, [place] C, PA-C     Controlled Substance Prescriptions [place] Controlled Substance Registry consulted? Not Applicable   Janith Lima, [place] 09/23/18 2043

## 2018-09-23 NOTE — Discharge Instructions (Signed)
We have treated you today for gonorrhea and chlamydia, with rocephin and azithromycin. Please refrain from sexual intercourse for 7 days while medicines eliminating infection. Begin metronidazole twice daily for 1 week for Bv. Do not drink alcohol until finishing last tablet.   We are testing you for Gonorrhea, Chlamydia, Trichomonas, Yeast and Bacterial Vaginosis. We will call you if anything is positive and let you know if you require any further treatment. Please inform partners of any positive results.   Please return if symptoms not improving with treatment, development of fever, nausea, vomiting, abdominal pain.

## 2018-09-24 LAB — CERVICOVAGINAL ANCILLARY ONLY
Bacterial vaginitis: POSITIVE — AB
Candida vaginitis: NEGATIVE
Chlamydia: NEGATIVE
Neisseria Gonorrhea: NEGATIVE
TRICH (WINDOWPATH): NEGATIVE

## 2019-02-24 ENCOUNTER — Other Ambulatory Visit: Payer: Self-pay

## 2019-02-24 ENCOUNTER — Inpatient Hospital Stay (HOSPITAL_COMMUNITY)
Admission: EM | Admit: 2019-02-24 | Discharge: 2019-02-26 | DRG: 552 | Disposition: A | Discharge status: Home / Self Care | Payer: No Typology Code available for payment source | Attending: Internal Medicine | Admitting: Internal Medicine

## 2019-02-24 ENCOUNTER — Encounter (HOSPITAL_COMMUNITY): Payer: Self-pay | Admitting: Pharmacist Clinician (PhC)/ Clinical Pharmacy Specialist

## 2019-02-24 ENCOUNTER — Emergency Department (HOSPITAL_COMMUNITY): Payer: No Typology Code available for payment source

## 2019-02-24 ENCOUNTER — Encounter (HOSPITAL_COMMUNITY): Payer: Self-pay | Admitting: *Deleted

## 2019-02-24 DIAGNOSIS — M47816 Spondylosis without myelopathy or radiculopathy, lumbar region: Secondary | ICD-10-CM | POA: Diagnosis present

## 2019-02-24 DIAGNOSIS — M4657 Other infective spondylopathies, lumbosacral region: Principal | ICD-10-CM | POA: Diagnosis present

## 2019-02-24 DIAGNOSIS — F1990 Other psychoactive substance use, unspecified, uncomplicated: Secondary | ICD-10-CM

## 2019-02-24 DIAGNOSIS — B1920 Unspecified viral hepatitis C without hepatic coma: Secondary | ICD-10-CM | POA: Diagnosis not present

## 2019-02-24 DIAGNOSIS — F4310 Post-traumatic stress disorder, unspecified: Secondary | ICD-10-CM | POA: Diagnosis present

## 2019-02-24 DIAGNOSIS — M609 Myositis, unspecified: Secondary | ICD-10-CM | POA: Diagnosis present

## 2019-02-24 DIAGNOSIS — Z8619 Personal history of other infectious and parasitic diseases: Secondary | ICD-10-CM | POA: Diagnosis not present

## 2019-02-24 DIAGNOSIS — G4700 Insomnia, unspecified: Secondary | ICD-10-CM | POA: Diagnosis present

## 2019-02-24 DIAGNOSIS — Z880 Allergy status to penicillin: Secondary | ICD-10-CM

## 2019-02-24 DIAGNOSIS — M545 Low back pain: Secondary | ICD-10-CM

## 2019-02-24 DIAGNOSIS — M48061 Spinal stenosis, lumbar region without neurogenic claudication: Secondary | ICD-10-CM | POA: Diagnosis present

## 2019-02-24 DIAGNOSIS — F329 Major depressive disorder, single episode, unspecified: Secondary | ICD-10-CM | POA: Diagnosis present

## 2019-02-24 DIAGNOSIS — F17210 Nicotine dependence, cigarettes, uncomplicated: Secondary | ICD-10-CM | POA: Diagnosis present

## 2019-02-24 DIAGNOSIS — F419 Anxiety disorder, unspecified: Secondary | ICD-10-CM | POA: Diagnosis present

## 2019-02-24 DIAGNOSIS — M009 Pyogenic arthritis, unspecified: Secondary | ICD-10-CM | POA: Diagnosis present

## 2019-02-24 DIAGNOSIS — R7982 Elevated C-reactive protein (CRP): Secondary | ICD-10-CM | POA: Diagnosis present

## 2019-02-24 DIAGNOSIS — F32A Depression, unspecified: Secondary | ICD-10-CM | POA: Diagnosis present

## 2019-02-24 DIAGNOSIS — M6008 Infective myositis, other site: Secondary | ICD-10-CM

## 2019-02-24 DIAGNOSIS — F1490 Cocaine use, unspecified, uncomplicated: Secondary | ICD-10-CM | POA: Diagnosis present

## 2019-02-24 DIAGNOSIS — F1920 Other psychoactive substance dependence, uncomplicated: Secondary | ICD-10-CM

## 2019-02-24 DIAGNOSIS — B182 Chronic viral hepatitis C: Secondary | ICD-10-CM | POA: Diagnosis present

## 2019-02-24 DIAGNOSIS — F1410 Cocaine abuse, uncomplicated: Secondary | ICD-10-CM | POA: Diagnosis not present

## 2019-02-24 DIAGNOSIS — Z590 Homelessness: Secondary | ICD-10-CM | POA: Diagnosis not present

## 2019-02-24 DIAGNOSIS — Z9851 Tubal ligation status: Secondary | ICD-10-CM | POA: Diagnosis not present

## 2019-02-24 DIAGNOSIS — M4650 Other infective spondylopathies, site unspecified: Secondary | ICD-10-CM

## 2019-02-24 DIAGNOSIS — F17200 Nicotine dependence, unspecified, uncomplicated: Secondary | ICD-10-CM | POA: Diagnosis present

## 2019-02-24 DIAGNOSIS — M5459 Other low back pain: Secondary | ICD-10-CM

## 2019-02-24 DIAGNOSIS — F1120 Opioid dependence, uncomplicated: Secondary | ICD-10-CM | POA: Diagnosis present

## 2019-02-24 DIAGNOSIS — F1110 Opioid abuse, uncomplicated: Secondary | ICD-10-CM | POA: Diagnosis not present

## 2019-02-24 DIAGNOSIS — F1123 Opioid dependence with withdrawal: Secondary | ICD-10-CM | POA: Diagnosis present

## 2019-02-24 DIAGNOSIS — M5442 Lumbago with sciatica, left side: Secondary | ICD-10-CM

## 2019-02-24 LAB — C-REACTIVE PROTEIN: CRP: 18.6 mg/dL — ABNORMAL HIGH (ref <1.0)

## 2019-02-24 LAB — BASIC METABOLIC PANEL
Anion gap: 10 (ref 5–15)
BUN: 8 mg/dL (ref 6–20)
CO2: 21 mmol/L — ABNORMAL LOW (ref 22–32)
Calcium: 8.9 mg/dL (ref 8.9–10.3)
Chloride: 105 mmol/L (ref 98–111)
Creatinine, Ser: 0.82 mg/dL (ref 0.44–1.00)
GFR calc Af Amer: >60 mL/min (ref >60)
GFR calc non Af Amer: >60 mL/min (ref >60)
Glucose, Bld: 96 mg/dL (ref 70–99)
Potassium: 3.3 mmol/L — ABNORMAL LOW (ref 3.5–5.1)
Sodium: 136 mmol/L (ref 135–145)

## 2019-02-24 LAB — CBC WITH DIFFERENTIAL/PLATELET
Abs Immature Granulocytes: 0.05 10*3/uL (ref 0.00–0.07)
Basophils Absolute: 0 10*3/uL (ref 0.0–0.1)
Basophils Relative: 0 %
EOS ABS: 0 10*3/uL (ref 0.0–0.5)
Eosinophils Relative: 0 %
HCT: 39.5 % (ref 36.0–46.0)
Hemoglobin: 12.7 g/dL (ref 12.0–15.0)
Immature Granulocytes: 0 %
Lymphocytes Relative: 16 %
Lymphs Abs: 1.8 10*3/uL (ref 0.7–4.0)
MCH: 27.8 pg (ref 26.0–34.0)
MCHC: 32.2 g/dL (ref 30.0–36.0)
MCV: 86.4 fL (ref 80.0–100.0)
Monocytes Absolute: 0.9 10*3/uL (ref 0.1–1.0)
Monocytes Relative: 8 %
Neutro Abs: 8.7 10*3/uL — ABNORMAL HIGH (ref 1.7–7.7)
Neutrophils Relative %: 76 %
Platelets: 324 10*3/uL (ref 150–400)
RBC: 4.57 MIL/uL (ref 3.87–5.11)
RDW: 13.1 % (ref 11.5–15.5)
WBC: 11.5 10*3/uL — ABNORMAL HIGH (ref 4.0–10.5)
nRBC: 0 % (ref 0.0–0.2)

## 2019-02-24 LAB — I-STAT BETA HCG BLOOD, ED (MC, WL, AP ONLY): I-stat hCG, quantitative: <5 m[IU]/mL (ref <5)

## 2019-02-24 LAB — LACTIC ACID, PLASMA: Lactic Acid, Venous: 0.9 mmol/L (ref 0.5–1.9)

## 2019-02-24 MED ORDER — VANCOMYCIN HCL 10 G IV SOLR
1500.0000 mg | Freq: Two times a day (BID) | INTRAVENOUS | Status: DC
  Administered 2019-02-25 – 2019-02-26 (×3): 1500 mg via INTRAVENOUS
  Filled 2019-02-24 (×4): qty 1500

## 2019-02-24 MED ORDER — LIDOCAINE 5 % EX PTCH
1.0000 | MEDICATED_PATCH | CUTANEOUS | Status: DC
  Administered 2019-02-24 – 2019-02-26 (×3): 1 via TRANSDERMAL
  Filled 2019-02-24 (×3): qty 1

## 2019-02-24 MED ORDER — SODIUM CHLORIDE 0.9 % IV BOLUS
1000.0000 mL | Freq: Once | INTRAVENOUS | Status: AC
  Administered 2019-02-24: 1000 mL via INTRAVENOUS

## 2019-02-24 MED ORDER — ENOXAPARIN SODIUM 40 MG/0.4ML ~~LOC~~ SOLN
40.0000 mg | SUBCUTANEOUS | Status: DC
  Administered 2019-02-24 – 2019-02-25 (×2): 40 mg via SUBCUTANEOUS
  Filled 2019-02-24 (×2): qty 0.4

## 2019-02-24 MED ORDER — VANCOMYCIN HCL 10 G IV SOLR
1500.0000 mg | Freq: Once | INTRAVENOUS | Status: AC
  Administered 2019-02-24: 1500 mg via INTRAVENOUS
  Filled 2019-02-24 (×2): qty 1500

## 2019-02-24 MED ORDER — KETOROLAC TROMETHAMINE 30 MG/ML IJ SOLN
30.0000 mg | Freq: Four times a day (QID) | INTRAMUSCULAR | Status: DC | PRN
  Administered 2019-02-24 – 2019-02-26 (×8): 30 mg via INTRAVENOUS
  Filled 2019-02-24 (×8): qty 1

## 2019-02-24 MED ORDER — ACETAMINOPHEN 325 MG PO TABS: 650.0000 mg | ORAL_TABLET | Freq: Four times a day (QID) | ORAL | Status: DC | PRN

## 2019-02-24 MED ORDER — HYDROMORPHONE HCL 1 MG/ML IJ SOLN
1.0000 mg | Freq: Once | INTRAMUSCULAR | Status: AC
  Administered 2019-02-24: 1 mg via INTRAVENOUS
  Filled 2019-02-24: qty 1

## 2019-02-24 MED ORDER — ACETAMINOPHEN 650 MG RE SUPP: 650.0000 mg | Freq: Four times a day (QID) | RECTAL | Status: DC | PRN

## 2019-02-24 MED ORDER — KETOROLAC TROMETHAMINE 30 MG/ML IJ SOLN
30.0000 mg | Freq: Once | INTRAMUSCULAR | Status: AC
  Administered 2019-02-24: 30 mg via INTRAVENOUS
  Filled 2019-02-24: qty 1

## 2019-02-24 MED ORDER — GADOBUTROL 1 MMOL/ML IV SOLN
10.0000 mL | Freq: Once | INTRAVENOUS | Status: AC | PRN
  Administered 2019-02-24: 8 mL via INTRAVENOUS

## 2019-02-24 MED ORDER — POLYETHYLENE GLYCOL 3350 17 G PO PACK: 17.0000 g | PACK | Freq: Every day | ORAL | Status: DC | PRN

## 2019-02-24 MED ORDER — PROMETHAZINE HCL 25 MG/ML IJ SOLN
12.5000 mg | Freq: Four times a day (QID) | INTRAMUSCULAR | Status: DC | PRN
  Administered 2019-02-24 – 2019-02-26 (×4): 12.5 mg via INTRAVENOUS
  Filled 2019-02-24 (×4): qty 1

## 2019-02-24 MED ORDER — SODIUM CHLORIDE 0.9 % IV SOLN
2.0000 g | Freq: Two times a day (BID) | INTRAVENOUS | Status: DC
  Administered 2019-02-24 – 2019-02-25 (×3): 2 g via INTRAVENOUS
  Filled 2019-02-24 (×4): qty 2

## 2019-02-24 NOTE — Progress Notes (Addendum)
Pharmacy Antibiotic Note  [name] is a 33 y.o. female admitted on 02/24/2019 with low back pain. 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 Planning to initiate broad spectrum abx. Blood cultures drawn in ED. SCr is stable.  Vancomycin 1500 mg IV Q 12 hrs. Goal AUC 400-550. Expected AUC: 513 SCr used: 0.8  I reviewed patient's allergies - notable for penicillin allergy. This patient has received other cephalosporins in the past; therefore, I will proceed with cefepime.  Plan: -Cefepime 2 g IV q12h -Vancomycin 1500 mg IV x1 then 1500 mg IV q12h -Monitor renal fx, cultures, AUC levels as needed   Height: 5\' 8"  (172.7 cm) Weight: 170 lb (77.1 kg) IBW/kg (Calculated) : 63.9  Temp (24hrs), Avg:98.9 F (37.2 C), Min:98.9 F (37.2 C), Max:98.9 F (37.2 C)  Recent Labs  Lab 02/24/19 0302  WBC 11.5*  CREATININE 0.82  LATICACIDVEN 0.9     Antimicrobials this admission: 3/26 vancomycin > 3/26 cefepime >  Dose adjustments this admission: N/A  Microbiology results: 3/26 blood cx:   Harvel Quale 02/24/2019 10:34 AM

## 2019-02-24 NOTE — ED Provider Notes (Addendum)
6:30 AM Handoff from [facility] PA-C/Dr. Christy Gentles at shift change.   Pt with h/o IVDU, low back pain. Mild leukocytosis, elevated CRP, reported fevers at home -- concern for spinal etiology. Awaiting MRI with contrast. Pain controlled with toradol, dilaudid on arrival.   BP (!) 158/100   Pulse 91   Temp 98.9 F (37.2 C) (Oral)   Resp 18   Ht 5\' 8"  (1.727 m)   Wt 77.1 kg   SpO2 96%   BMI 25.85 kg/m    9:13 AM MRI personally reviewed.   Consistent with likely septic arthritis of L5/S1 facet with associated myositis.    Spoke with IMTS who will be admitting. They request neurosurgery input and hold abx for now.   BP (!) 137/94   Pulse 89   Temp 98.9 F (37.2 C) (Oral)   Resp 16   Ht 5\' 8"  (1.727 m)   Wt 77.1 kg   SpO2 99%   BMI 25.85 kg/m   9:56 AM Spoke with Dr. Annette Stable, neurosurgery. Recommends IR input for consideration of joint aspiration.   Spoke with Dr. Pascal Lux, IR. He has reviewed imaging. States would treat empirically at this point -- no fluid collection seen that would be amenable to drainage at this time.      Carlisle Cater, PA-C 02/24/19 0957    Carlisle Cater, PA-C 02/24/19 1158    Ripley Fraise, MD 02/24/19 [phone]

## 2019-02-24 NOTE — ED Notes (Signed)
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

## 2019-02-24 NOTE — ED Provider Notes (Signed)
Patient seen/examined in the Emergency Department in conjunction with Advanced Practice Provider [facility] Patient reports back pain, h/o IVDA Exam : resting comfortably, however reports pain in low back Plan: due to h/o IVDA, recent fevers, elevated CRP, will proceed with MRI of lumbar spine    Ripley Fraise, MD 02/24/19 [phone]

## 2019-02-24 NOTE — Progress Notes (Signed)
33 year old female with history of IV drug abuse presents with back pain.  Minimal if any radicular symptoms.  No symptoms of sensory loss or weakness.  Work-up demonstrates elevated CRP.  MRI scan demonstrates myositis with posterior element inflammation at L4-5 and L5-S1 left greater than right.  No evidence of significant epidural abscess or compressive lesion requiring surgery.  Patient should be admitted for IV antibiotic therapy which should include broad Staphylococcus aureus coverage.  Interventional radiology can attempt to aspirate the facet joint at L5-S1 on the left.  I am not sure that any usable sample will likely be obtained.  Please call me if further assistance is necessary.

## 2019-02-24 NOTE — ED Notes (Signed)
ED TO INPATIENT HANDOFF REPORT  ED Nurse Name and Phone #: [phone], Reuel Derby Name/Age/Gender [name] 33 y.o. female Room/Bed: 017C/017C  Code Status   Code Status: Prior  Home/SNF/Other Home Patient oriented to: self, place, time and situation Is this baseline? Yes   Triage Complete: Triage complete  Chief Complaint back pain  Triage Note Pt arrives via [facility]  with c/o left sided back pain radiates down left leg for several days. Has taken some ibuprofen and flexeril yesterday without relief. 132/82, hr 104, 94% RA, 20, 99.8.  Pt reporting left lower back pain for 3 days, felt like she has had a fever for about 2 days. No urinary symptoms, vaginal symptoms or injury. No numbness or tingling, loss of bowel or bladder  Pt friend, Inocente Salles, can pick her up at disposition [phone]   Allergies Allergies  Allergen Reactions  . Penicillins Cross Reactors Hives    Has patient had a PCN reaction causing immediate rash, facial/tongue/throat swelling, SOB or lightheadedness with hypotension: Yes Has patient had a PCN reaction causing severe rash involving mucus membranes or skin necrosis: Yes Has patient had a PCN reaction that required hospitalization unknown Has patient had a PCN reaction occurring within the last 10 years: unknown If all of the above answers are "NO", then may proceed with Cephalosporin use.     Level of Care/Admitting Diagnosis ED Disposition    ED Disposition Condition Comment   Admit  The patient appears reasonably stabilized for admission considering the current resources, flow, and capabilities available in the ED at this time, and I doubt any other [facility] requiring further screening and/or treatment in the ED prior to admission is  present.       B Medical/Surgery History Past Medical History:  Diagnosis Date  . Ankle pain, chronic   . Anxiety   . Depression   . Depression with anxiety   . Dilated bile duct 07/2017   CBD up to 11 mm on ultrasound  and MRCP.  no choledocholithiasis or gallstones.    . [name] antibody positive in blood 07/2017  . Polysubstance abuse ([place])   . PTSD (post-traumatic stress disorder)    Past Surgical History:  Procedure Laterality Date  . ANKLE SURGERY    . APPENDECTOMY    . EUS Right 07/20/2017   Procedure: ESOPHAGEAL ENDOSCOPIC ULTRASOUND (EUS) RADIAL;  Surgeon: Milus Banister, MD;  [facility] END[facility]COPY;  Service: Endoscopy;  Laterality: Right;  . TUBAL LIGATION     2009  . TUBAL LIGATION       A IV Location/Drains/Wounds Patient Lines/Drains/Airways Status   Active Line/Drains/Airways    Name:   Placement date:   Placement time:   Site:   Days:   Peripheral IV 02/24/19 Left;Upper Arm   02/24/19    0346    Arm   less than 1          Intake/Output Last 24 hours  Intake/Output Summary (Last 24 hours) at 02/24/2019 0955 Last data filed at 02/24/2019 0505 Gross per 24 hour  Intake 1000 ml  Output -  Net 1000 ml    Labs/Imaging Results for orders placed or performed during the hospital encounter of 02/24/19 (from the past 48 hour(s))  CBC with Differential     Status: Abnormal   Collection Time: 02/24/19  3:02 AM  Result Value Ref Range   WBC 11.5 (H) 4.0 - 10.5 K/uL   RBC 4.57 3.87 - 5.11 MIL/uL   Hemoglobin  12.7 12.0 - 15.0 g/dL   HCT 39.5 36.0 - 46.0 %   MCV 86.4 80.0 - 100.0 fL   MCH 27.8 26.0 - 34.0 pg   MCHC 32.2 30.0 - 36.0 g/dL   RDW 13.1 11.5 - 15.5 %   Platelets 324 150 - 400 K/uL   nRBC 0.0 0.0 - 0.2 %   Neutrophils Relative % 76 %   Neutro Abs 8.7 (H) 1.7 - 7.7 K/uL   Lymphocytes Relative 16 %   Lymphs Abs 1.8 0.7 - 4.0 K/uL   Monocytes Relative 8 %   Monocytes Absolute 0.9 0.1 - 1.0 K/uL   Eosinophils Relative 0 %   Eosinophils Absolute 0.0 0.0 - 0.5 K/uL   Basophils Relative 0 %   Basophils Absolute 0.0 0.0 - 0.1 K/uL   Immature Granulocytes 0 %   Abs Immature Granulocytes 0.05 0.00 - 0.07 K/uL    Comment: Performed at [facility]  [address], [place], [place] [address]  C-reactive protein     Status: Abnormal   Collection Time: 02/24/19  3:02 AM  Result Value Ref Range   CRP 18.6 (H) <1.0 mg/dL    Comment: Performed at [facility] [address], [place], [place] [address]  Basic metabolic panel     Status: Abnormal   Collection Time: 02/24/19  3:02 AM  Result Value Ref Range   Sodium 136 135 - 145 mmol/L   Potassium 3.3 (L) 3.5 - 5.1 mmol/L   Chloride 105 98 - 111 mmol/L   CO2 21 (L) 22 - 32 mmol/L   Glucose, Bld 96 70 - 99 mg/dL   BUN 8 6 - 20 mg/dL   Creatinine, Ser 0.82 0.44 - 1.00 mg/dL   Calcium 8.9 8.9 - 10.3 mg/dL   GFR calc non Af Amer >60 >60 mL/min   GFR calc Af Amer >60 >60 mL/min   Anion gap 10 5 - 15    Comment: Performed at [facility], [place] [address], [place], [place] [address]  Lactic acid, plasma     Status: None   Collection Time: 02/24/19  3:02 AM  Result Value Ref Range   Lactic Acid, Venous 0.9 0.5 - 1.9 mmol/L    Comment: Performed at [facility] [address], [place], [place] [address]  I-Stat Beta hCG blood, ED ([facility], [facility], [facility] only)     Status: None   Collection Time: 02/24/19  3:18 AM  Result Value Ref Range   I-stat hCG, quantitative <5.0 <5 mIU/mL   Comment 3            Comment:   GEST. AGE      CONC.  (mIU/mL)   <=1 WEEK        5 - 50     2 WEEKS       50 - 500     3 WEEKS       100 - 10,000     4 WEEKS     1,000 - 30,000        FEMALE AND NON-PREGNANT FEMALE:     LESS THAN 5 mIU/mL    Mr Lumbar Spine W Wo Contrast  Result Date: 02/24/2019 CLINICAL DATA:  Fever and left low back pain for 3 days. History of IV drug use. EXAM: MRI LUMBAR SPINE WITHOUT AND WITH CONTRAST TECHNIQUE: Multiplanar and multiecho pulse sequences of the lumbar spine were obtained without and with intravenous contrast. CONTRAST:  8 mL Gadavist COMPARISON:  Lumbar spine radiographs 07/30/2017. CT abdomen and pelvis 03/28/2013. FINDINGS: Some sequences are mildly to moderately motion  degraded. Segmentation: The lowest fully formed intervertebral disc space is designated L5-S1. The ribs at T12 are hypoplastic. Alignment:  Minimal anterolisthesis of L5 on S1. Vertebrae: They are is fluid in the left L5-S1 facet joint with prominent edema and enhancement within the adjacent marrow and soft tissues. There is abnormal dorsal epidural enhancement at L5-S1 which extends superiorly to the L4 level, and there is also abnormal ventral epidural enhancement at L5. No organized epidural fluid collection is identified. There is less pronounced marrow edema and enhancement about the right L5-S1 facet joint with minimal joint fluid. There is no evidence of infectious discitis. No fracture or suspicious focal osseous lesion is identified. Conus medullaris and cauda equina: Conus extends to the lower L1 level. Conus and cauda equina appear normal. Paraspinal and other soft tissues: Left posterior paraspinal muscle edema and enhancement throughout the lumbar spine. No paraspinal fluid collection. Disc levels: Disc desiccation and mild disc space narrowing from L3-4 to L5-S1. L1-2: Negative. L2-3: Slight facet hypertrophy without disc herniation or stenosis. L3-4: Circumferential disc bulging, a left foraminal to extraforaminal disc protrusion, congenitally short pedicles, and mild facet and ligamentum flavum hypertrophy result in mild spinal stenosis, mild left lateral recess stenosis, and borderline right and mild left neural foraminal stenosis. The disc protrusion may affect the left L3 nerve. L4-5: Disc bulging, a left foraminal to extraforaminal disc protrusion, congenitally short pedicles, and mild facet hypertrophy result in moderate spinal stenosis, mild bilateral lateral recess stenosis, and borderline right and moderate left neural foraminal stenosis with potential left L4 nerve root impingement. L5-S1: Anterolisthesis with mild bulging of uncovered disc and mild right and moderate left facet hypertrophy  result in moderate left neural foraminal stenosis with potential left L5 nerve root impingement. Epidural enhancement partially effaces the thecal sac. IMPRESSION: 1. Inflammation about the left greater than right L5-S1 facet joints and throughout the left paraspinal musculature concerning for septic facet arthritis and myositis with this history. Associated epidural phlegmon without evidence of drainable epidural or paraspinal abscess. 2. Left foraminal and extraforaminal disc protrusions at L3-4 and L4-5 potentially affecting the L3 and L4 nerves. 3. Moderate multifactorial spinal stenosis at L4-5. Electronically Signed   By: Logan Bores M.D.   On: 02/24/2019 08:42    Pending Labs Unresulted Labs (From admission, onward)    Start     Ordered   02/24/19 0847  Blood culture (routine x 2)  BLOOD CULTURE X 2,   STAT     02/24/19 0846   02/24/19 0254  Urinalysis, Routine w reflex microscopic  Once,   R     02/24/19 0253          Vitals/Pain Today's Vitals   02/24/19 0709 02/24/19 0858 02/24/19 0900 02/24/19 0930  BP:   (!) 137/94 (!) 150/90  Pulse:  92 89 92  Resp:  18 16 19   Temp:      TempSrc:      SpO2:  100% 99% 98%  Weight:      Height:      PainSc: Asleep       Isolation Precautions No active isolations  Medications Medications  lidocaine (LIDODERM) 5 % 1 patch (1 patch Transdermal Patch Applied 02/24/19 0329)  ketorolac (TORADOL) 30 MG/ML injection 30 mg (30 mg Intravenous Given 02/24/19 0352)  HYDROmorphone (DILAUDID) injection 1 mg (1 mg Intravenous Given 02/24/19  8307)  sodium chloride 0.9 % bolus 1,000 mL (0 mLs Intravenous Stopped 02/24/19 0504)  gadobutrol (GADAVIST) 1 MMOL/ML injection 10 mL (8 mLs Intravenous Contrast Given 02/24/19 0816)    Mobility walks Low fall risk   Focused Assessments    R Recommendations: See Admitting Provider Note  Report given to:   Additional Notes:

## 2019-02-24 NOTE — ED Notes (Signed)
The pt continues to sleep

## 2019-02-24 NOTE — ED Triage Notes (Signed)
Pt reporting left lower back pain for 3 days, felt like she has had a fever for about 2 days. No urinary symptoms, vaginal symptoms or injury. No numbness or tingling, loss of bowel or bladder  Pt friend, Inocente Salles, can pick her up at disposition [phone]

## 2019-02-24 NOTE — ED Notes (Signed)
Pt had to be awakened  To give med

## 2019-02-24 NOTE — ED Provider Notes (Signed)
[facility] EMERGENCY DEPARTMENT Provider Note   CSN: [id number] Arrival date & time: 02/24/19  6073    History   Chief Complaint Chief Complaint  Patient presents with  . Back Pain    HPI [name] is a 33 y.o. female.    33 year old female with a history of anxiety, depression, [name], polysubstance abuse presents to the emergency department for evaluation of left-sided low back pain x3 days.  Pain has been constant, worsening over the last 1 to 2 days.  She has been taking ibuprofen and Flexeril for symptoms without relief.  Last use ibuprofen yesterday morning.  States that pain is worse with movement as well as ambulation.  She noticed onset of symptoms after use of IV drugs 3 days ago.  Last used cocaine and heroin.  Reports subjective fever characterized as her feeling "hot" x2 days.  She denies extremity numbness or paresthesias, genital or perianal numbness, bowel or bladder incontinence, dysuria, hematuria, sick contacts.  The history is provided by the patient. No language interpreter was used.  Back Pain    Past Medical History:  Diagnosis Date  . Ankle pain, chronic   . Anxiety   . Depression   . Depression with anxiety   . Dilated bile duct 07/2017   CBD up to 11 mm on ultrasound and MRCP.  no choledocholithiasis or gallstones.    . [name] antibody positive in blood 07/2017  . Polysubstance abuse ([place])   . PTSD (post-traumatic stress disorder)     Patient Active Problem List   Diagnosis Date Noted  . Dilated bile duct   . Abnormal LFTs (liver function tests)   . Drug intoxication with delirium ([place])   . Polysubstance abuse ([place])   . Rhabdomyolysis 07/15/2017  . Hypokalemia 07/15/2017  . Abnormal liver function 07/15/2017  . Chronic [name] ([place]) 03/30/2013  . Bacterial vaginosis 03/30/2013  . Pyelonephritis 03/28/2013  . Polysubstance abuse ([place]) 03/28/2013  . 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 Polysubstance dependence  including opioid type drug, continuous use ([place]) 06/22/2012    Class: Chronic  . Substance induced mood disorder ([place]) 06/22/2012    Class: Chronic  . Smokes tobacco daily 01/28/2007  . DEPRESSIVE DISORDER, NOS 01/28/2007    Past Surgical History:  Procedure Laterality Date  . ANKLE SURGERY    . APPENDECTOMY    . EUS Right 07/20/2017   Procedure: ESOPHAGEAL ENDOSCOPIC ULTRASOUND (EUS) RADIAL;  Surgeon: Milus Banister, MD;  Location: [facility] ENDOSCOPY;  Service: Endoscopy;  Laterality: Right;  . TUBAL LIGATION     2009  . TUBAL LIGATION       OB History   No obstetric history on file.      Home Medications    Prior to Admission medications   Medication Sig Start Date End Date Taking? Authorizing Provider  triamcinolone cream (KENALOG) 0.1 % Apply 1 application topically 2 (two) times daily. 03/30/18   Raylene Everts, MD    Family History Family History  Family history unknown: Yes    Social History Social History   Tobacco Use  . Smoking status: Current Every Day Smoker    Packs/day: 0.50    Years: 16.00    Pack years: 8.00    Types: Cigarettes  . Smokeless tobacco: Never Used  Substance Use Topics  . Alcohol use: No    Comment: occasionally  . Drug use: Yes    Types: Heroin, Cocaine, Methamphetamines    Comment: heroin  Allergies   Penicillins cross reactors   Review of Systems Review of Systems  Musculoskeletal: Positive for back pain.  Ten systems reviewed and are negative for acute change, except as noted in the HPI.    Physical Exam Updated Vital Signs BP (!) 174/106   Pulse 95   Temp 98.9 F (37.2 C) (Oral)   Resp 18   Ht 5\' 8"  (1.727 m)   Wt 77.1 kg   SpO2 94%   BMI 25.85 kg/m   Physical Exam Vitals signs and nursing note reviewed.  Constitutional:      General: She is not in acute distress.    Appearance: She is well-developed. She is not diaphoretic.     Comments: Writhing around in the bed.  Appears uncomfortable.  HENT:      Head: Normocephalic and atraumatic.  Eyes:     General: No scleral icterus.    Conjunctiva/sclera: Conjunctivae normal.  Neck:     Musculoskeletal: Normal range of motion.  Cardiovascular:     Rate and Rhythm: Normal rate and regular rhythm.     Pulses: Normal pulses.  Pulmonary:     Effort: Pulmonary effort is normal. No respiratory distress.     Comments: Respirations even and unlabored Musculoskeletal: Normal range of motion.     Lumbar back: She exhibits tenderness. She exhibits no swelling, no edema and no deformity.       Back:     Comments: No bony deformities, step-offs, crepitus to the lumbosacral midline.  Positive straight leg raise on the left with negative cross straight leg raise.  Skin:    General: Skin is warm and dry.     Coloration: Skin is not pale.     Findings: No erythema or rash.  Neurological:     Mental Status: She is alert and oriented to person, place, and time.     Comments: Moving all extremities spontaneously.  Sensation to light touch intact in bilateral lower extremities.  Psychiatric:        Behavior: Behavior normal.      ED Treatments / Results  Labs (all labs ordered are listed, but only abnormal results are displayed) Labs Reviewed  CBC WITH DIFFERENTIAL/PLATELET - Abnormal; Notable for the following components:      Result Value   WBC 11.5 (*)    Neutro Abs 8.7 (*)    All other components within normal limits  C-REACTIVE PROTEIN - Abnormal; Notable for the following components:   CRP 18.6 (*)    All other components within normal limits  BASIC METABOLIC PANEL - Abnormal; Notable for the following components:   Potassium 3.3 (*)    CO2 21 (*)    All other components within normal limits  LACTIC ACID, PLASMA  URINALYSIS, ROUTINE W REFLEX MICROSCOPIC  I-STAT BETA HCG BLOOD, ED ([facility], [facility], [facility] ONLY)    EKG None  Radiology No results found.  Procedures Procedures (including critical care time)  Medications Ordered in ED  Medications  lidocaine (LIDODERM) 5 % 1 patch (1 patch Transdermal Patch Applied 02/24/19 0329)  ketorolac (TORADOL) 30 MG/ML injection 30 mg (30 mg Intravenous Given 02/24/19 0352)  HYDROmorphone (DILAUDID) injection 1 mg (1 mg Intravenous Given 02/24/19 0353)  sodium chloride 0.9 % bolus 1,000 mL (0 mLs Intravenous Stopped 02/24/19 0504)     Initial Impression / Assessment and Plan / ED Course  I have reviewed the triage vital signs and the nursing notes.  Pertinent labs & imaging results that were available  during my care of the patient were reviewed by me and considered in my medical decision making (see chart for details).        33 year old female presents for 3 days of low back pain which has been worsening over the last 1 to 2 days.  She is neurovascularly intact on my assessment, but reports subjective fever for the last 2 days as well as ongoing use of IV cocaine and heroin.  She is afebrile in the emergency department, but with elevated heart rate and elevated CRP.  Leukocytosis noted with left shift.  Plan for MRI of the lumbar spine with and without contrast to assess for epidural abscess.  If negative, patient can be safely discharged with instruction for continued use of NSAIDs for pain management.  Patient signed out to [facility], PA-C at change of shift.   Final Clinical Impressions(s) / ED Diagnoses   Final diagnoses:  Acute left-sided low back pain with left-sided sciatica  IVDU (intravenous drug user)    ED Discharge Orders    None       Antonietta Breach, PA-C 02/24/19 0601    Ripley Fraise, MD 02/24/19 [phone]

## 2019-02-24 NOTE — ED Notes (Signed)
Pt c/o extreme pain for 24-48 hours

## 2019-02-24 NOTE — ED Triage Notes (Signed)
Pt arrives via [facility]  with c/o left sided back pain radiates down left leg for several days. Has taken some ibuprofen and flexeril yesterday without relief. 132/82, hr 104, 94% RA, 20, 99.8.

## 2019-02-24 NOTE — H&P (Signed)
Date: 02/24/2019               Patient Name:  [name] MRN: [id number]  DOB: 02-09-1986 Age / Sex: 33 y.o., female   PCP: Patient, No Pcp Per         Medical Service: Internal Medicine Teaching Service         Attending Physician: Dr. Aldine Contes, MD    First Contact: Dr. Koleen Distance Pager: [phone]  Second Contact: Dr. Berline Lopes Pager: [phone]       After Hours (After 5p/  First Contact Pager: [phone]  weekends / holidays): Second Contact Pager: [phone]   Chief Complaint: Lower back pain and subjective fever for past 3 days.  History of Present Illness: [name] is a 33 y.o. female with PMHx significant for anxiety, depression, [name], polysubstance abuse which include daily IV heroin and cocaine came to ED with complaint of worsening lower back pain for the past 2 to 3 days and subjective fevers.  Patient uses daily IV heroin and cocaine, last use was 2 days ago.  She was experiencing worsening lower back pain with subjective fevers and some chills. She was also experiencing mild nausea but no vomiting.  She denies any needle sharing, mixes her drugs with water. She denies any tingling or numbness or focal deficit.  No saddle anesthesia.  Denies any incontinence urinary or fecal.  She denies any chest pain or shortness of breath.  She denies any upper respiratory symptoms.  She denies any aches and pains.  Appetite currently mildly decreased.  No change in her bowel habit.  Denies any urinary symptoms.  ED course.  She was afebrile, hemodynamically stable, labs with neutrophilic predominant leukocytosis and elevated C-reactive protein, MRI spine shows possible septic facet joint at L5 and S1 level along with some myositis.  No sign of any abscess or fluid collection.  Meds:  No outpatient medications have been marked as taking for the 02/24/19 encounter [facility] Encounter).   Allergies: Allergies as of 02/24/2019 - Review Complete 09/23/2018  Allergen  Reaction Noted  . Penicillins cross reactors Hives 12/15/2011   Past Medical History:  Diagnosis Date  . Ankle pain, chronic   . Anxiety   . Depression   . Depression with anxiety   . Dilated bile duct 07/2017   CBD up to 11 mm on ultrasound and MRCP.  no choledocholithiasis or gallstones.    . [name] antibody positive in blood 07/2017  . Polysubstance abuse ([place])   . PTSD (post-traumatic stress disorder)     Family History: Noncontributory  Social History: Currently homeless, smoker, uses cocaine and heroin for many years.  Occasionally drink alcohol.  Review of Systems: A complete ROS was negative except as per HPI.   Physical Exam: Blood pressure (!) 142/97, pulse 94, temperature 98.9 F (37.2 C), temperature source Oral, resp. rate 15, height 5\' 8"  (1.727 m), weight 77.1 kg, SpO2 98 %. Vitals:   02/24/19 0900 02/24/19 0930 02/24/19 1000 02/24/19 1109  BP: (!) 137/94 (!) 150/90 (!) 142/97 (!) 146/78  Pulse: 89 92 94 90  Resp: 16 19 15 14   Temp:    99.1 F (37.3 C)  TempSrc:    Oral  SpO2: 99% 98% 98% 99%  Weight:      Height:       General: Vital signs reviewed.  Patient is well-developed and well-nourished, in no acute distress and cooperative with exam.  Head: Normocephalic and atraumatic. Eyes:  EOMI, conjunctivae normal, no scleral icterus.  Neck: Supple, trachea midline, normal ROM, no JVD,  Cardiovascular: RRR, S1 normal, S2 normal, no murmurs, gallops, or rubs. Pulmonary/Chest: Clear to auscultation bilaterally, no wheezes, rales, or rhonchi. Abdominal: Soft, non-tender, non-distended, BS +, no masses, organomegaly, or guarding present.  Musculoskeletal: Marked tenderness along lumbar and sacral area, both spinal and paraspinal region. Extremities: No lower extremity edema bilaterally,  pulses symmetric and intact bilaterally. No cyanosis or clubbing. Neurological: A&O x3, Strength is normal and symmetric bilaterally, cranial nerve II-XII are grossly  intact, no focal motor deficit, sensory intact to light touch bilaterally.  Skin: Warm, dry and intact. No rashes or erythema. Psychiatric: Normal mood and affect. speech and behavior is normal. Cognition and memory are normal.  Assessment & Plan by Problem: [name] is a 33 y.o. female with PMHx significant for anxiety, depression, [name], polysubstance abuse which include daily IV heroin and cocaine came to ED with complaint of worsening lower back pain for the past 2 to 3 days and subjective fevers.  Active Problems:   Septic joint [facility]) Patient most likely has septic facet joint without any abscess.  Talked with neurosurgery and IR who does not think that they can obtain a sample for culture at this time.  Blood cultures are pending. -Empiric antibiotics with cefepime and vancomycin. -Toradol for pain.  Polysubstance abuse.  She is high risk for opioid withdrawal.  No current sign or symptoms of withdrawal. -She can be placed on clonidine detox protocol if needed.  Elevaded blood pressure.  No prior history of hypertension.  Might be due to pain. -We will monitor.  DVT prophylaxis.  Lovenox CODE STATUS.  Full Diet.  Regular  Dispo: Admit patient to Inpatient with expected length of stay greater than 2 midnights.  SignedLorella Nimrod, MD 02/24/2019, 10:35 AM  Pager: [phone]

## 2019-02-24 NOTE — ED Notes (Signed)
Patient transported to MRI 

## 2019-02-25 DIAGNOSIS — F419 Anxiety disorder, unspecified: Secondary | ICD-10-CM

## 2019-02-25 DIAGNOSIS — M609 Myositis, unspecified: Secondary | ICD-10-CM

## 2019-02-25 DIAGNOSIS — M545 Low back pain: Secondary | ICD-10-CM

## 2019-02-25 DIAGNOSIS — Z8619 Personal history of other infectious and parasitic diseases: Secondary | ICD-10-CM

## 2019-02-25 DIAGNOSIS — F1410 Cocaine abuse, uncomplicated: Secondary | ICD-10-CM

## 2019-02-25 DIAGNOSIS — Z590 Homelessness: Secondary | ICD-10-CM

## 2019-02-25 DIAGNOSIS — Z880 Allergy status to penicillin: Secondary | ICD-10-CM

## 2019-02-25 DIAGNOSIS — F17210 Nicotine dependence, cigarettes, uncomplicated: Secondary | ICD-10-CM

## 2019-02-25 DIAGNOSIS — M4656 Other infective spondylopathies, lumbar region: Secondary | ICD-10-CM

## 2019-02-25 DIAGNOSIS — F1110 Opioid abuse, uncomplicated: Secondary | ICD-10-CM

## 2019-02-25 DIAGNOSIS — R768 Other specified abnormal immunological findings in serum: Secondary | ICD-10-CM

## 2019-02-25 DIAGNOSIS — F329 Major depressive disorder, single episode, unspecified: Secondary | ICD-10-CM

## 2019-02-25 DIAGNOSIS — F1990 Other psychoactive substance use, unspecified, uncomplicated: Secondary | ICD-10-CM | POA: Diagnosis present

## 2019-02-25 LAB — COMPREHENSIVE METABOLIC PANEL
ALT: 16 U/L (ref 0–44)
AST: 18 U/L (ref 15–41)
Albumin: 2.8 g/dL — ABNORMAL LOW (ref 3.5–5.0)
Alkaline Phosphatase: 41 U/L (ref 38–126)
Anion gap: 6 (ref 5–15)
BUN: 9 mg/dL (ref 6–20)
CO2: 22 mmol/L (ref 22–32)
Calcium: 8.2 mg/dL — ABNORMAL LOW (ref 8.9–10.3)
Chloride: 108 mmol/L (ref 98–111)
Creatinine, Ser: 0.75 mg/dL (ref 0.44–1.00)
GFR calc Af Amer: >60 mL/min (ref >60)
GFR calc non Af Amer: >60 mL/min (ref >60)
Glucose, Bld: 91 mg/dL (ref 70–99)
Potassium: 3 mmol/L — ABNORMAL LOW (ref 3.5–5.1)
Sodium: 136 mmol/L (ref 135–145)
Total Bilirubin: UNDETERMINED mg/dL (ref 0.3–1.2)
Total Protein: 6.8 g/dL (ref 6.5–8.1)

## 2019-02-25 LAB — CBC
HCT: 35.4 % — ABNORMAL LOW (ref 36.0–46.0)
Hemoglobin: 11.9 g/dL — ABNORMAL LOW (ref 12.0–15.0)
MCH: 28.1 pg (ref 26.0–34.0)
MCHC: 33.6 g/dL (ref 30.0–36.0)
MCV: 83.5 fL (ref 80.0–100.0)
Platelets: 330 10*3/uL (ref 150–400)
RBC: 4.24 MIL/uL (ref 3.87–5.11)
RDW: 12.9 % (ref 11.5–15.5)
WBC: 7.2 10*3/uL (ref 4.0–10.5)
nRBC: 0 % (ref 0.0–0.2)

## 2019-02-25 LAB — URINALYSIS, ROUTINE W REFLEX MICROSCOPIC
Bilirubin Urine: NEGATIVE
Glucose, UA: NEGATIVE mg/dL
HGB URINE DIPSTICK: NEGATIVE
KETONES UR: 20 mg/dL — AB
Leukocytes,Ua: NEGATIVE
Nitrite: NEGATIVE
Protein, ur: NEGATIVE mg/dL
Specific Gravity, Urine: 1.014 (ref 1.005–1.030)
pH: 6 (ref 5.0–8.0)

## 2019-02-25 LAB — HIV ANTIBODY (ROUTINE TESTING W REFLEX): HIV Screen 4th Generation wRfx: NONREACTIVE

## 2019-02-25 LAB — HEPATITIS C ANTIBODY (REFLEX): HCV Ab: >11 s/co ratio — ABNORMAL HIGH (ref 0.0–0.9)

## 2019-02-25 LAB — COMMENT2 - HEP PANEL

## 2019-02-25 MED ORDER — DOXYCYCLINE HYCLATE 100 MG PO TABS: 100.0000 mg | ORAL_TABLET | Freq: Two times a day (BID) | ORAL | 0 refills | Status: AC

## 2019-02-25 MED ORDER — PROMETHAZINE HCL 25 MG/ML IJ SOLN
12.5000 mg | Freq: Once | INTRAMUSCULAR | Status: AC
  Administered 2019-02-25: 12.5 mg via INTRAVENOUS
  Filled 2019-02-25: qty 1

## 2019-02-25 MED ORDER — POTASSIUM CHLORIDE CRYS ER 20 MEQ PO TBCR
20.0000 meq | EXTENDED_RELEASE_TABLET | Freq: Two times a day (BID) | ORAL | Status: AC
  Administered 2019-02-25: 20 meq via ORAL
  Filled 2019-02-25 (×2): qty 1

## 2019-02-25 MED ORDER — SODIUM CHLORIDE 0.9 % IV SOLN
2.0000 g | INTRAVENOUS | Status: DC
  Administered 2019-02-25 – 2019-02-26 (×2): 2 g via INTRAVENOUS
  Filled 2019-02-25 (×2): qty 20

## 2019-02-25 MED ORDER — LEVOFLOXACIN 750 MG PO TABS: 750.0000 mg | ORAL_TABLET | Freq: Every day | ORAL | 0 refills | Status: AC

## 2019-02-25 MED FILL — DOXYCYCLINE HYCLATE 100 MG: 100 | 40 days supply | Qty: 80 | Fill #0

## 2019-02-25 MED FILL — levoFLOXacin 750 MG TABS: 750 | 40 days supply | Qty: 40 | Fill #0

## 2019-02-25 NOTE — Progress Notes (Signed)
Pt c/o lower back pain slept quietly most of the shift Prn Toradol x 1 and phenergan for nausea  Given. With good result  No further c/o resting quietly Tele in use  Start EKG done for QT check.

## 2019-02-25 NOTE — Progress Notes (Addendum)
Patient ID: [name], female   DOB: 1986/02/02, 33 y.o.   MRN: [id number]         [facility] for Infectious Disease    Date of Admission:  02/24/2019           Day 2 vancomycin        Day 2 cefepime       Reason for Consult: Septic lumbar facet arthritis with adjacent myositis    Referring Provider: Dr. Lorella Nimrod  Assessment: Her acute back pain and MRI findings are almost certainly due to lumbar facet joint infection.  I would continue vancomycin and downgrade cefepime to ceftriaxone now pending final blood cultures.  She is not a candidate for outpatient IV antibiotic therapy given her homelessness and active injecting drug use.  If her blood cultures remain negative I would consider discharge on 6 weeks of oral levofloxacin and doxycycline.  She has [name] antibody positive.  Her [name] viral load was undetectable in 2014 signifying spontaneous clearing.  She may have become reinfected due to her ongoing injecting drug use.  I will repeat her viral load now.  She is HIV antibody negative.  She needs to be screened for syphilis, GC and chlamydia.  Plan: 1. Continue vancomycin and change cefepime to ceftriaxone pending final blood cultures 2. [name] viral load 3. Syphilis, GC and chlamydia screening  Principal Problem:   Septic joint ([place]) Active Problems:   Polysubstance dependence including opioid type drug, continuous use (HCC)   IVDU (intravenous drug user)   Smokes tobacco daily   Depression   Chronic [name] ([place])   Scheduled Meds: . enoxaparin (LOVENOX) injection  40 mg Subcutaneous Q24H  . lidocaine  1 patch Transdermal Q24H  . potassium chloride  20 mEq Oral BID   Continuous Infusions: . ceFEPime (MAXIPIME) IV 2 g (02/25/19 1033)  . vancomycin 1,500 mg (02/25/19 0056)   PRN Meds:.acetaminophen **OR** acetaminophen, ketorolac, polyethylene glycol, promethazine  HPI: [name] is a 33 y.o. female who developed severe left lower back  pain 4 days ago.  She did not have any injury preceding onset of the pain.  She has a long history of polysubstance addiction and intravenous drug use.  She has been injecting heroin recently.  She denies any recent skin infections or abscesses.  She was admitted yesterday and underwent MRI which showed:  IMPRESSION: 1. Inflammation about the left greater than right L5-S1 facet joints and throughout the left paraspinal musculature concerning for septic facet arthritis and myositis with this history. Associated epidural phlegmon without evidence of drainable epidural or paraspinal abscess. 2. Left foraminal and extraforaminal disc protrusions at L3-4 and L4-5 potentially affecting the L3 and L4 nerves. 3. Moderate multifactorial spinal stenosis at L4-5.  Interventional radiology did not feel that they could successfully aspirate fluid so she was started on empiric antibiotic therapy after blood cultures were obtained.  The blood cultures are negative at 24 hours.  She has a long history of depression but is currently homeless.   Review of Systems: Review of Systems  Constitutional: Negative for chills, diaphoresis and fever.  Musculoskeletal: Positive for back pain.  Psychiatric/Behavioral: Positive for depression and substance abuse.    Past Medical History:  Diagnosis Date  . Ankle pain, chronic   . Anxiety   . Depression   . Depression with anxiety   . Dilated bile duct 07/2017   CBD up to 11 mm on ultrasound and MRCP.  no choledocholithiasis  or gallstones.    . [name] antibody positive in blood 07/2017  . Polysubstance abuse ([place])   . PTSD (post-traumatic stress disorder)     Social History   Tobacco Use  . Smoking status: Current Every Day Smoker    Packs/day: 0.50    Years: 16.00    Pack years: 8.00    Types: Cigarettes  . Smokeless tobacco: Never Used  Substance Use Topics  . Alcohol use: No    Comment: occasionally  . Drug use: Yes    Types: Heroin,  Cocaine, Methamphetamines    Comment: heroin    Family History  Family history unknown: Yes   Allergies  Allergen Reactions  . Penicillins Cross Reactors Hives    Has patient had a PCN reaction causing immediate rash, facial/tongue/throat swelling, SOB or lightheadedness with hypotension: Yes Has patient had a PCN reaction causing severe rash involving mucus membranes or skin necrosis: Yes Has patient had a PCN reaction that required hospitalization unknown Has patient had a PCN reaction occurring within the last 10 years: unknown If all of the above answers are "NO", then may proceed with Cephalosporin use. No issues with cephalosporins     OBJECTIVE: Blood pressure (!) 146/94, pulse 86, temperature 98.3 F (36.8 C), temperature source Oral, resp. rate 18, height 5\' 8"  (1.727 m), weight 77.1 kg, SpO2 100 %.  Physical Exam Constitutional:      Comments: She is resting quietly in bed.  Cardiovascular:     Rate and Rhythm: Normal rate and regular rhythm.     Heart sounds: No murmur.  Pulmonary:     Effort: Pulmonary effort is normal.     Breath sounds: Normal breath sounds.  Skin:    Comments: Multiple, healing scars on her arms.     Lab Results Lab Results  Component Value Date   WBC 7.2 02/25/2019   HGB 11.9 (L) 02/25/2019   HCT 35.4 (L) 02/25/2019   MCV 83.5 02/25/2019   PLT 330 02/25/2019    Lab Results  Component Value Date   CREATININE 0.75 02/25/2019   BUN 9 02/25/2019   NA 136 02/25/2019   K 3.0 (L) 02/25/2019   CL 108 02/25/2019   CO2 22 02/25/2019    Lab Results  Component Value Date   ALT 16 02/25/2019   AST 18 02/25/2019   ALKPHOS 41 02/25/2019   BILITOT QUANTITY NOT SUFFICIENT, UNABLE TO PERFORM TEST 02/25/2019     Microbiology: Recent Results (from the past 240 hour(s))  Blood culture (routine x 2)     Status: None (Preliminary result)   Collection Time: 02/24/19  9:15 AM  Result Value Ref Range Status   Specimen Description BLOOD RIGHT  HAND  Final   Special Requests   Final    BOTTLES DRAWN AEROBIC AND ANAEROBIC Blood Culture results may not be optimal due to an excessive volume of blood received in culture bottles   Culture   Final    NO GROWTH < 24 HOURS Performed at [facility], [place] [address], [place], [place] [address]    Report Status PENDING  Incomplete  Blood culture (routine x 2)     Status: None (Preliminary result)   Collection Time: 02/24/19  9:25 AM  Result Value Ref Range Status   Specimen Description BLOOD RIGHT HAND  Final   Special Requests   Final    BOTTLES DRAWN AEROBIC ONLY Blood Culture adequate volume   Culture   Final  NO GROWTH < 24 HOURS Performed at [place] [address], [place], [place] [address]    Report Status PENDING  Incomplete    Michel Bickers, MD [facility] for Infectious [place] [facility] [phone] pager   [phone] cell 02/25/2019, 12:22 PM

## 2019-02-25 NOTE — TOC Initial Note (Signed)
Transition of Care [facility]) - Initial/Assessment Note    Patient Details  Name: [name] MRN: [id number] Date of Birth: 05-07-1986  [facility]) CM/SW Contact:    Pollie Friar, RN Phone Number: 02/25/2019, 2:41 PM  Clinical Narrative:                 Pt staying with friends. She is interested in [facility] but need date of d/c to be able to schedule. Pt states she has VA benefits but doesn't go to the [place] consistently. CM encouraged her to f[facility] with the [facility] and use her resources.  Pt will need assistance with the cost of meds at d/c Will need assist with transportation at d/c.   Expected Discharge Plan: Home/Self Care Barriers to Discharge: Continued Medical Work up, Homeless with medical needs, Inadequate or no insurance   Patient Goals and CMS Choice        Expected Discharge Plan and Services Expected Discharge Plan: Home/Self Care   Discharge Planning Services: CM Consult, [facility], Medication Assistance, [place] arrangements for the past 2 months: Homeless                          Prior Living Arrangements/Services Living arrangements for the past 2 months: Homeless Lives with:: Other (Comment)(friends at times) Patient language and need for interpreter reviewed:: Yes(no needs)        Need for Family Participation in Patient Care: No (Comment) Care giver support system in place?: No (comment)   Criminal Activity/Legal Involvement Pertinent to Current Situation/Hospitalization: No - Comment as needed  Activities of Daily Living      Permission Sought/Granted                  Emotional Assessment Appearance:: Appears older than stated age Attitude/Demeanor/Rapport: Engaged Affect (typically observed): Accepting, Pleasant, Appropriate Orientation: : Oriented to Self, Oriented to Place, Oriented to  Time, Oriented to Situation Alcohol / Substance Use: Illicit Drugs Psych Involvement: No (comment)  Admission  diagnosis:  IVDU (intravenous drug user) [F19.90] Lumbar facet joint pain [M54.5] Acute left-sided low back pain with left-sided sciatica [M54.42] Infective myositis of other site [M60.08] Septic arthritis of vertebra, due to unspecified organism [facility]) [M46.50] Patient Active Problem List   Diagnosis Date Noted  . IVDU (intravenous drug user) 02/25/2019  . Septic joint ([place]) 02/24/2019  . Chronic [name] ([place]) 03/30/2013  . 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 Polysubstance dependence including opioid type drug, continuous use ([place]) 06/22/2012    Class: Chronic  . Substance induced mood disorder ([place]) 06/22/2012    Class: Chronic  . Smokes tobacco daily 01/28/2007  . Depression 01/28/2007   PCP:  Patient, No Pcp Per Pharmacy:   [id number] [place], [place] [address] Phone: [phone] Fax: [phone]  [id number] - [place], [place] [address] Phone: [phone] Fax: [phone]  [id number] [place], [place] [address] [place] [address] Phone: [phone] Fax: [phone]  [facility] [place], [place] - [address] [place] [address] Phone: [phone] Fax: [place], [place]  [place] [address] Phone: [phone] Fax: [phone]     Social Determinants of Health (SDOH) Interventions  Has issues affording meds.  Has issues with transportation.   Readmission Risk Interventions No flowsheet data found.

## 2019-02-25 NOTE — Progress Notes (Signed)
   Subjective: [name] continues to have 7/10 back pain that is only somewhat helped by the toradol. She endorses nausea. She denies fevers, chills, diarrhea or sweating.   Objective:  Vital signs in last 24 hours: Vitals:   02/24/19 1648 02/24/19 1955 02/24/19 2326 02/25/19 0402  BP: (!) 152/96 123/80 (!) 157/99 138/82  Pulse: 92 93 95 78  Resp: (!) 22 16 18 18   Temp: 98.6 F (37 C) 99.3 F (37.4 C) 99.4 F (37.4 C) 97.8 F (36.6 C)  TempSrc: Oral Oral Oral Oral  SpO2: 100% 100% 98% 96%  Weight:      Height:       Gen: alert and oriented, no distress CV: RRR, no murmurs Pulm: CTAB, normal effort on room air Abd: bowel sounds present, non-distended, non-tender  Assessment/Plan:  Active Problems:   Septic joint ([place])  Thomas L Keckis a 33 y.o.female with PMHx significant for anxiety, depression, [name], polysubstance abuse which include daily IV heroin and cocaine came to ED with complaint of worsening lower back pain for the past 2 to 3 days and subjective fevers. MRI findings consistent with septic facet joint at L5/S1. No abscess seen. Neurosurgery and IR consulted, but did not think they could safely get a sample for culture.   Septic facet joint without abscess  - continue empiric IV vanc and cefepime - blood cultures pending - Toradol prn for pain control  - will need 6 week course of antibiotics. Given IVDU, likely a high risk for home IV antibiotics   Polysubstance abuse.  She is high risk for opioid withdrawal.  No current sign or symptoms of withdrawal. - continue to monitor and will use Clonidine protocol    Dispo: Anticipated discharge pending further work-up and plan for antibiotic therapy long-term.   Modena Nunnery D, DO 02/25/2019, 6:30 AM Pager: [phone]

## 2019-02-25 NOTE — Progress Notes (Signed)
Patient has prescriptions filled through transitions of care [facility]) pharmacy and are stored in main pharmacy - levofloxacin and doxycycline. When patient is ready for discharge please retrieve these from main pharmacy.   Janae Bridgeman, PharmD PGY1 Pharmacy Resident Phone: [phone] 02/25/2019 3:09 PM

## 2019-02-26 DIAGNOSIS — M4657 Other infective spondylopathies, lumbosacral region: Principal | ICD-10-CM

## 2019-02-26 DIAGNOSIS — F1410 Cocaine abuse, uncomplicated: Secondary | ICD-10-CM

## 2019-02-26 DIAGNOSIS — F419 Anxiety disorder, unspecified: Secondary | ICD-10-CM

## 2019-02-26 DIAGNOSIS — F1120 Opioid dependence, uncomplicated: Secondary | ICD-10-CM

## 2019-02-26 DIAGNOSIS — B1920 Unspecified viral hepatitis C without hepatic coma: Secondary | ICD-10-CM

## 2019-02-26 LAB — CBC
HCT: 38.2 % (ref 36.0–46.0)
Hemoglobin: 12.4 g/dL (ref 12.0–15.0)
MCH: 27.2 pg (ref 26.0–34.0)
MCHC: 32.5 g/dL (ref 30.0–36.0)
MCV: 83.8 fL (ref 80.0–100.0)
Platelets: 473 10*3/uL — ABNORMAL HIGH (ref 150–400)
RBC: 4.56 MIL/uL (ref 3.87–5.11)
RDW: 12.5 % (ref 11.5–15.5)
WBC: 7.6 10*3/uL (ref 4.0–10.5)
nRBC: 0 % (ref 0.0–0.2)

## 2019-02-26 LAB — BASIC METABOLIC PANEL
Anion gap: 14 (ref 5–15)
BUN: 8 mg/dL (ref 6–20)
CO2: 20 mmol/L — AB (ref 22–32)
Calcium: 9.1 mg/dL (ref 8.9–10.3)
Chloride: 107 mmol/L (ref 98–111)
Creatinine, Ser: 0.75 mg/dL (ref 0.44–1.00)
GFR calc Af Amer: >60 mL/min (ref >60)
GFR calc non Af Amer: >60 mL/min (ref >60)
Glucose, Bld: 101 mg/dL — ABNORMAL HIGH (ref 70–99)
Potassium: 3.4 mmol/L — ABNORMAL LOW (ref 3.5–5.1)
Sodium: 141 mmol/L (ref 135–145)

## 2019-02-26 LAB — RPR: RPR Ser Ql: NONREACTIVE

## 2019-02-26 MED ORDER — BUPRENORPHINE HCL-NALOXONE HCL 8-2 MG SL SUBL: 2.0000 | SUBLINGUAL_TABLET | Freq: Every day | SUBLINGUAL | 0 refills | Status: DC

## 2019-02-26 MED ORDER — BUPRENORPHINE HCL-NALOXONE HCL 8-2 MG SL SUBL
1.0000 | SUBLINGUAL_TABLET | Freq: Every day | SUBLINGUAL | Status: DC
  Administered 2019-02-26: 1 via SUBLINGUAL
  Filled 2019-02-26: qty 1

## 2019-02-26 NOTE — Progress Notes (Signed)
Patient being discharged home with a friend. All discharge paperwork went over with patient. All questions and concerns addressed. Nurse stressed importance of taking Suboxone as prescribed and going to all follow-up appointments. IVs taken out, tele removed. Patient taken down in wheelchair. [place]

## 2019-02-26 NOTE — Progress Notes (Signed)
   Subjective: [name] was seen and evaluated on bedside on morning rounds. She has been experiencing nausea, vomiting, muscle aches, insomnia, diaphoresis, and abdominal discomfort since yesterday which she attributes to her withdrawal.  In the past, she has done methadone, but does not wish to do that again in the future as this was overly burdensome and she experienced withdraw symptoms regularly. She has not tried the suboxone through a medical provider but has tried this off the street for a period of time successfully. She is currently homeless, but plans on staying with a friend when she leaves. This friend is not someone that uses drugs and she states he will be supportive of her coming off of them.  She has used cocaine after running out of pain medication upon discharge from the [employer].  She injects opioid pain medications into both arms. She contributes her homelessness, problems with family and inability to keep a job to her drug addiction. She still occassionally uses cocaine.  She is interested in suboxone therapy prior to discharge.  Her back is feeling much better since admission.   Objective:  Vital signs in last 24 hours: Vitals:   02/25/19 1645 02/25/19 1954 02/26/19 0006 02/26/19 0334  BP: 140/72 140/81 (!) 164/95 (!) 143/91  Pulse: 79 87 69 63  Resp: 16 18 20 17   Temp: 98.5 F (36.9 C) 98.6 F (37 C) 98 F (36.7 C) 97.8 F (36.6 C)  TempSrc: Oral Oral Oral Oral  SpO2: 99% 99% 100% 99%  Weight:      Height:       General: A/O x4, in no acute distress, afebrile, nondiaphoretic Cardio: RRR, no mrg's  Pulmonary: CTA bilaterally, no wheezing or crackles  Abdomen: Bowel sounds normal, soft, nontender  MSK: No TTP of lumbar back  Psych: Somewhat flat affect, but engages appropriately  Assessment/Plan:  Principal Problem:   Septic joint ([place]) Active Problems:   Smokes tobacco daily   Depression   Polysubstance dependence including opioid type drug, continuous  use (HCC)   Chronic [name] ([place])   IVDU (intravenous drug user)  [employer] a 33 y.o.femalewith PMHxsignificant foranxiety, depression, [name], polysubstance abusewhich include daily IV heroin and cocainecame to ED with complaint of worsening lower back pain for the past 2 to 3 days and subjective fevers. MRI findings consistent with septic facet joint at L5/S1. No abscess seen. Neurosurgery and IR consulted, but did not think they could safely get a sample for culture.   Septic facet joint without abscess  - she is on IV vanc and ceftriaxone - appreciate ID recommendations. If her blood cx remain negative after 48 hours, will transition to oral Levaquin and Doxy for a 6 week course. Given her history of IVDU, she is not a good candidate for home IV antibiotics - she has remained afebrile and back pain has significantly improved   Polysubstance abuse. - currently have withdrawal symptoms of nausea, vomiting, diarrhea, insomnia.  - last had a dose IV dilaudid in the ED approximately 48 hours ago - she is interested in starting Suboxone. Education was provided on what to expect and how this medication will be managed in our clinic - will initiate 2 mg Suboxone this morning and monitor for improvement in symptoms prior to discharging on home dose    Dispo: Anticipated discharge in approximately 0-1 day(s).   Modena Nunnery D, DO 02/26/2019, 6:19 AM Pager: [phone]

## 2019-02-26 NOTE — Discharge Summary (Signed)
Name: [name] MRN: [id number] DOB: 1986-03-05 33 y.o. PCP: Patient, No Pcp Per  Date of Admission: 02/24/2019  2:39 AM Date of Discharge: 02/26/2019 Attending Physician: Lucious Groves, DO  Discharge Diagnosis: 1. Septic facet joint without abscess  2. Opioid use disorder  Discharge Medications: Allergies as of 02/26/2019      Reactions   Penicillins Cross Reactors Hives   Has patient had a PCN reaction causing immediate rash, facial/tongue/throat swelling, SOB or lightheadedness with hypotension: Yes Has patient had a PCN reaction causing severe rash involving mucus membranes or skin necrosis: Yes Has patient had a PCN reaction that required hospitalization unknown Has patient had a PCN reaction occurring within the last 10 years: unknown If all of the above answers are "NO", then may proceed with Cephalosporin use. No issues with cephalosporins      Medication List    STOP taking these medications   triamcinolone cream 0.1 % Commonly known as:  KENALOG     TAKE these medications   buprenorphine-naloxone 8-2 mg Subl SL tablet Commonly known as:  SUBOXONE Place 2 tablets under the tongue daily for 15 days.   doxycycline 100 MG tablet Commonly known as:  VIBRA-TABS Take 1 tablet (100 mg total) by mouth 2 (two) times daily.   levofloxacin 750 MG tablet Commonly known as:  Levaquin Take 1 tablet (750 mg total) by mouth daily.       Disposition and follow-up:   [name] was [facility] from [facility] in Good condition.  At the hospital follow up visit please address:  1.  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 She was discharged on 6 week course of Levaquin and Doxy per ID recommendations. Please ensure she has continued to take daily (stope date 04/09/19).  Opioid use disorder: Patient wished to be started on Suboxone for treatment. Initiated on 8-2mg  2 tablets daily.   2.   Labs / imaging needed at time of follow-up: CBC, BMP  3.  Pending labs/ test needing follow-up: Hep C genotype   Follow-up Appointments: Follow-up Information    [place]. Go on 03/14/2019.   Why:  Call to schedule hospital follow up appointment on 03/14/2019 at 8:30 am Contact information: [place] [id number] [phone]          Hospital Course by problem list: [name] a 33 y.o.femalewith PMHxsignificant foranxiety, depression, [name], polysubstance abusewhich include daily IV heroin and cocainecame to ED with complaint of worsening lower back pain for the past 2 to 3 days and subjective fevers.MRI findings consistent with septic facet joint at L5/S1. No abscess seen. Neurosurgery and IR consulted, but did not think they could safely get a sample for culture so she was initiated on parenteral broad-spectrum antibiotics.   1. Septic facet joint without abscess - pain significantly improved after 48 hours of antibiotics  - Berry cultures remained negative. Per ID recs, she was transitioned or oral Levaquin and Doxy to complete a 6 week course  2. Opioid Use Disorder - patient experienced withdrawal symptoms of n/v, diarrhea and insomnia during hospitalization - she was interested in starting Suboxone for treatment. Education was provided on how to use it and how it will be managed in our clinic - she was initiated on 8-2 mg 2 tabs daily. Will need follow-up telehealth visit in 2 weeks.  3. Positive hep C antibody - RNA negative    Discharge Vitals:   BP (!) 145/99 (BP Location: Right Arm)   Pulse 75   Temp 97.9 F (36.6 C) (Oral)   Resp 16   Ht 5\' 8"  (1.727 m)   Wt 77.1 kg   SpO2 100%   BMI 25.85 kg/m   Pertinent Labs, Studies, and Procedures:  MR lumbar spine IMPRESSION: 1. Inflammation about the left greater than right L5-S1 facet joints and throughout the left paraspinal  musculature concerning for septic facet arthritis and myositis with this history. Associated epidural phlegmon without evidence of drainable epidural or paraspinal abscess. 2. Left foraminal and extraforaminal disc protrusions at L3-4 and L4-5 potentially affecting the L3 and L4 nerves. 3. Moderate multifactorial spinal stenosis at L4-5.  Discharge Instructions: Discharge Instructions    Call MD for:  persistant nausea and vomiting   Complete by:  As directed    Call MD for:  severe uncontrolled pain   Complete by:  As directed    Call MD for:  temperature >100.4   Complete by:  As directed    Diet - low sodium heart healthy   Complete by:  As directed    Discharge instructions   Complete by:  As directed    [name],  It was a pleasure taking care of you. You were treated in the hospital for an infection in a joint in your low back. You will need to continue taking the two antibiotics every day for the next 6 weeks.   You have also been started on Suboxone which will need to followed up in our clinic downstairs. You will likely have your first visit over the telephone with the coronavirus going on. You should receive a phone call next week regarding this.   Take care, [name]   Increase activity slowly   Complete by:  As directed       Signed: Delice Bison, DO 03/03/2019, 9:32 AM   Pager: [phone]

## 2019-02-26 NOTE — TOC Transition Note (Signed)
Transition of Care [facility]) - CM/SW Discharge Note   Patient Details  Name: [name] MRN: [id number] Date of Birth: 09/12/1986  Transition of Care [facility]) CM/SW Contact:  Sharin Mons, RN Phone Number: 02/26/2019, 1:25 PM   Clinical Narrative:     Transition to home today. Pt states has transportation to home. [facility] pharmacy to deliver Rx meds to bedside prior to d/c. Pt to f/u with [facility] for hospital f/u appointment. Clinic currently not scheduling f/u appointment with new pts for 2 weeks. Pt is to call an arranged f/u appointment on 03/14/2019. NCM made pt aware and noted on AVS.    Barriers to Discharge: Continued Medical Work up, Homeless with medical needs, Inadequate or no insurance   Patient Goals and CMS Choice        Discharge Placement                       Discharge Plan and Services   Discharge Planning Services: CM Consult, [facility], Medication Assistance, [place] [facility]                      Social Determinants of Health (SDOH) Interventions     Readmission Risk Interventions No flowsheet data found.

## 2019-02-28 ENCOUNTER — Other Ambulatory Visit: Payer: Self-pay

## 2019-02-28 LAB — URINE CYTOLOGY ANCILLARY ONLY
Chlamydia: NEGATIVE
Neisseria Gonorrhea: NEGATIVE

## 2019-02-28 NOTE — Telephone Encounter (Signed)
Requesting to speak with a nurse about buprenorphine-naloxone (SUBOXONE) 8-2 mg SUBL SL tablet. Please call pt back.

## 2019-03-01 LAB — CULTURE, BLOOD (ROUTINE X 2)
Culture: NO GROWTH
Culture: NO GROWTH
Special Requests: ADEQUATE

## 2019-03-01 NOTE — Telephone Encounter (Signed)
Call made to patient-no answer, HIPPA compliant message left on recorder.Despina Hidden Cassady3/31/20209:54 AM

## 2019-03-02 LAB — HEPATITIS C VRS RNA DETECT BY PCR-QUAL: Hepatitis C Vrs RNA by PCR-Qual: NEGATIVE

## 2019-03-04 ENCOUNTER — Encounter: Payer: Self-pay | Admitting: Internal Medicine

## 2019-03-04 ENCOUNTER — Ambulatory Visit (INDEPENDENT_AMBULATORY_CARE_PROVIDER_SITE_OTHER): Payer: No Typology Code available for payment source | Admitting: Internal Medicine

## 2019-03-04 ENCOUNTER — Other Ambulatory Visit: Payer: Self-pay

## 2019-03-04 ENCOUNTER — Telehealth: Payer: Self-pay | Admitting: Internal Medicine

## 2019-03-04 DIAGNOSIS — F1920 Other psychoactive substance dependence, uncomplicated: Secondary | ICD-10-CM

## 2019-03-04 DIAGNOSIS — F1120 Opioid dependence, uncomplicated: Secondary | ICD-10-CM

## 2019-03-04 DIAGNOSIS — M0088 Arthritis due to other bacteria, vertebrae: Secondary | ICD-10-CM

## 2019-03-04 MED ORDER — BUPRENORPHINE HCL-NALOXONE HCL 8-2 MG SL SUBL: 2.0000 | SUBLINGUAL_TABLET | Freq: Every day | SUBLINGUAL | 0 refills | Status: AC

## 2019-03-04 NOTE — Telephone Encounter (Signed)
His has been addressed

## 2019-03-04 NOTE — Telephone Encounter (Signed)
First call pt couldn't hear me well, called back x2 she screened call and hung up.

## 2019-03-04 NOTE — Addendum Note (Signed)
Addended by: Larey Dresser A on: 03/04/2019 10:08 AM   Modules accepted: Orders

## 2019-03-04 NOTE — Assessment & Plan Note (Signed)
   A: Septic joint: Still having some back pain but has not worsened, no new symptoms, not taking abx consistently sometimes misses doxy mainly.  Has cut back on cigarette use smoking about 1/4 ppd, from 1ppd.  No fever or chills, trying to stay indoors and social distance.    P: encouraged abx compliance, one week follow up  As always, pt is advised that if symptoms worsen or new symptoms arise, they should go to an urgent care facility or to to ER for further evaluation

## 2019-03-04 NOTE — Progress Notes (Signed)
   [facility] Telephone Encounter  Reason for call:   This telephone encounter was created for Ms. Tecora Huntley Estelle on 03/04/2019 for the following purpose/cc OUD, Septic arthritis .   Pertinent Data:   MRI of lumbar spine shows probable facet joint septic arthritis at level of L5-S1   Assessment / Plan / Recommendations:   A: OUD: Says suboxone too expensive so didn't pick it up.  Had withdrawal symptoms and slipped used some heroin last use last night and this morning. Still wants to try suboxone.    P: send suboxone script to [place] outpatient pharmacy, follow up in one week to check in on progress ensure she obtained suboxone  A: Septic joint: Still having some back pain but has not worsened, no new symptoms, not taking abx consistently sometimes misses doxy mainly.  Has cut back on cigarette use smoking about 1/4 ppd, from 1ppd.  No fever or chills, trying to stay indoors and social distance.    P: encouraged abx compliance, one week follow up  As always, pt is advised that if symptoms worsen or new symptoms arise, they should go to an urgent care facility or to to ER for further evaluation.   Consent and Medical Decision Making:   Patient discussed with Dr. Lynnae January  This is a telephone encounter between [employer] and Vickki Muff on 03/04/2019 for OUD and septic arthritis. The visit was conducted with the patient located at home and Vickki Muff at [facility]. The patient's identity was confirmed using their DOB and current address. The patient has consented to being evaluated through a telephone encounter and understands the associated risks (an examination cannot be done and the patient may need to come in for an appointment) / benefits (allows the patient to remain at home, decreasing exposure to coronavirus). I personally spent 15 minutes on medical discussion.

## 2019-03-04 NOTE — Assessment & Plan Note (Signed)
   A: OUD: Says suboxone too expensive so didn't pick it up.  Had withdrawal symptoms and slipped used some heroin last use last night and this morning. Still wants to try suboxone.    P: send suboxone script to  outpatient pharmacy, follow up in one week to check in on progress ensure she obtained suboxone

## 2019-03-04 NOTE — Progress Notes (Signed)
Internal Medicine Clinic Attending  Case discussed with Dr. Shan Levans at the time of the visit.  We reviewed the resident's history and exam and pertinent patient test results.  I agree with the assessment, diagnosis, and plan of care documented in the resident's note.  I sent her suboxone Rx to [facility] as it was too expensive at [facility].

## 2019-03-08 ENCOUNTER — Encounter: Payer: Self-pay | Admitting: Internal Medicine

## 2019-03-08 ENCOUNTER — Ambulatory Visit (INDEPENDENT_AMBULATORY_CARE_PROVIDER_SITE_OTHER): Payer: No Typology Code available for payment source | Admitting: Internal Medicine

## 2019-03-08 ENCOUNTER — Telehealth: Payer: Self-pay

## 2019-03-08 ENCOUNTER — Other Ambulatory Visit: Payer: Self-pay

## 2019-03-08 DIAGNOSIS — F1920 Other psychoactive substance dependence, uncomplicated: Secondary | ICD-10-CM

## 2019-03-08 DIAGNOSIS — F1120 Opioid dependence, uncomplicated: Secondary | ICD-10-CM

## 2019-03-08 DIAGNOSIS — M0088 Arthritis due to other bacteria, vertebrae: Secondary | ICD-10-CM

## 2019-03-08 NOTE — Progress Notes (Signed)
   [facility] Telephone Encounter  Reason for call:   This telephone encounter was created for [name] on 03/08/2019 for the following purpose/cc septic joint, OUD.   Pertinent Data:   Recent admission for lumbar facet septic arthritis from IVDU, still using heroin, taking abx intermittently, worsening radicular symptoms, leg weakness.   ROS: Pulmonary: pt denies increased work of breathing, shortness of breath,  Cardiac: pt denies palpitations, chest pain,   Abdominal: pt denies abdominal pain, nausea, vomiting, or diarrhea   Assessment / Plan / Recommendations:    A: OUD: pt says she called [employer] and [employer] [phone], says she will try to pick up today has still not got around to picking it up. States last use of heroin was 3 days ago.    P: will need to get evaluated in ED can hopefully get started on suboxone inpatient vs outpt    A:Septic arthritis: pain seems to be coming back, it is very sore when walking, complains of weakness in legs, has to pull herself up this feels like when she was first evaluated for this.  She also complains of burning in feet, numbness in feet, difficulty walking. Much worse than when we talked last week. Still taking antibiotics very inconsistently. No fevers or chills.  No urinary incontinence, she does have constipation but not worse than her baseline.  P: advised pt to go to the emergency room for further evaluation and repeat MRI, likely will require admission for IV abx as pt taking outpt abx intermittently  I took time to explain the risks of not seeking treatment including but not limited to paralysis,sepsis and death.      Consent and Medical Decision Making:   Patient discussed with Dr. Beryle Beams  This is a telephone encounter between [name] and [name] on 03/08/2019 for OUD, Septic Joint. The visit was conducted with the patient located at home and [name] at [facility].  The patient's identity was confirmed using their DOB and current address. The patient has consented to being evaluated through a telephone encounter and understands the associated risks (an examination cannot be done and the patient may need to come in for an appointment) / benefits (allows the patient to remain at home, decreasing exposure to coronavirus). I personally spent 22 minutes on medical discussion.

## 2019-03-08 NOTE — Progress Notes (Signed)
Medicine attending: Medical history, presenting problems,  and medications, reviewed with resident physician Dr Guadlupe Spanish on the day of the patient telephone conversation and I concur with his evaluation and management plan. Heroin addict recent admitted for septic arthritis of lumbar spine. Sent home on 2 PO antibiotics. Poorly compliant. Still abusing. Now w increasing back pain, having to pull herself up to stand; no bladder/bowel incontinence. We have recommended hospital admission for IV antibiotics & suboxone. Discussed w inpatient team. Would like to get stat MRI spine through ED prior to admitting.

## 2019-03-08 NOTE — Assessment & Plan Note (Signed)
   A:Septic arthritis: pain seems to be coming back, it is very sore when walking, complains of weakness in legs, has to pull herself up this feels like when she was first evaluated for this.  She also complains of burning in feet, numbness in feet, difficulty walking. Much worse than when we talked last week. Still taking antibiotics very inconsistently. No fevers or chills.  No urinary incontinence, she does have constipation but not worse than her baseline.  P: advised pt to go to the emergency room for further evaluation and repeat MRI, likely will require admission for IV abx as pt taking outpt abx intermittently  I took time to explain the risks of not seeking treatment including but not limited to paralysis,sepsis and death.

## 2019-03-08 NOTE — Assessment & Plan Note (Signed)
   A: OUD: pt says she called [employer] and [employer] [phone], says she will try to pick up today has still not got around to picking it up. States last use of heroin was 3 days ago.    P: will need to get evaluated in ED can hopefully get started on suboxone inpatient vs outpt

## 2019-03-09 LAB — HCV RNA QUANT RFLX ULTRA OR GENOTYP
HCV RNA Qnt(log copy/mL): UNDETERMINED {Log_IU}/mL
HepC Qn: NOT DETECTED [IU]/mL

## 2019-03-10 LAB — HEPATITIS C GENOTYPE

## 2019-03-15 ENCOUNTER — Telehealth: Payer: Self-pay | Admitting: *Deleted

## 2019-03-15 NOTE — Telephone Encounter (Signed)
Have tried to reach out via ph, unable to make contact

## 2019-03-18 MED FILL — BUPRENORPHIN-NALOXON 8-2 MG: 8-2 | 15 days supply | Qty: 30 | Fill #0

## 2019-05-02 ENCOUNTER — Encounter (HOSPITAL_COMMUNITY)
Admission: EM | Discharge status: Against Medical Advice | Payer: Self-pay | Source: Home / Self Care | Attending: Internal Medicine

## 2019-05-02 ENCOUNTER — Inpatient Hospital Stay (HOSPITAL_COMMUNITY): Payer: Self-pay | Admitting: Certified Registered Nurse Anesthetist

## 2019-05-02 ENCOUNTER — Emergency Department (HOSPITAL_COMMUNITY): Payer: Self-pay

## 2019-05-02 ENCOUNTER — Inpatient Hospital Stay (HOSPITAL_COMMUNITY)
Admission: EM | Admit: 2019-05-02 | Discharge: 2019-05-05 | DRG: 580 | Discharge status: Against Medical Advice | Payer: Self-pay | Attending: Internal Medicine | Admitting: Internal Medicine

## 2019-05-02 ENCOUNTER — Other Ambulatory Visit: Payer: Self-pay

## 2019-05-02 ENCOUNTER — Encounter (HOSPITAL_COMMUNITY): Payer: Self-pay | Admitting: Emergency Medicine

## 2019-05-02 DIAGNOSIS — L02414 Cutaneous abscess of left upper limb: Principal | ICD-10-CM | POA: Diagnosis present

## 2019-05-02 DIAGNOSIS — Z79899 Other long term (current) drug therapy: Secondary | ICD-10-CM

## 2019-05-02 DIAGNOSIS — F17210 Nicotine dependence, cigarettes, uncomplicated: Secondary | ICD-10-CM | POA: Diagnosis present

## 2019-05-02 DIAGNOSIS — Z1159 Encounter for screening for other viral diseases: Secondary | ICD-10-CM

## 2019-05-02 DIAGNOSIS — F1910 Other psychoactive substance abuse, uncomplicated: Secondary | ICD-10-CM | POA: Diagnosis present

## 2019-05-02 DIAGNOSIS — M25532 Pain in left wrist: Secondary | ICD-10-CM | POA: Diagnosis present

## 2019-05-02 DIAGNOSIS — M4656 Other infective spondylopathies, lumbar region: Secondary | ICD-10-CM | POA: Diagnosis present

## 2019-05-02 DIAGNOSIS — F1920 Other psychoactive substance dependence, uncomplicated: Secondary | ICD-10-CM | POA: Diagnosis present

## 2019-05-02 DIAGNOSIS — Z880 Allergy status to penicillin: Secondary | ICD-10-CM

## 2019-05-02 DIAGNOSIS — L02419 Cutaneous abscess of limb, unspecified: Secondary | ICD-10-CM | POA: Diagnosis present

## 2019-05-02 DIAGNOSIS — L03114 Cellulitis of left upper limb: Secondary | ICD-10-CM | POA: Diagnosis present

## 2019-05-02 DIAGNOSIS — M25579 Pain in unspecified ankle and joints of unspecified foot: Secondary | ICD-10-CM | POA: Diagnosis present

## 2019-05-02 DIAGNOSIS — Z8739 Personal history of other diseases of the musculoskeletal system and connective tissue: Secondary | ICD-10-CM

## 2019-05-02 DIAGNOSIS — B1920 Unspecified viral hepatitis C without hepatic coma: Secondary | ICD-10-CM | POA: Diagnosis present

## 2019-05-02 DIAGNOSIS — M4316 Spondylolisthesis, lumbar region: Secondary | ICD-10-CM | POA: Diagnosis present

## 2019-05-02 DIAGNOSIS — F1490 Cocaine use, unspecified, uncomplicated: Secondary | ICD-10-CM | POA: Diagnosis present

## 2019-05-02 DIAGNOSIS — F4310 Post-traumatic stress disorder, unspecified: Secondary | ICD-10-CM | POA: Diagnosis present

## 2019-05-02 DIAGNOSIS — Z590 Homelessness: Secondary | ICD-10-CM

## 2019-05-02 DIAGNOSIS — F418 Other specified anxiety disorders: Secondary | ICD-10-CM | POA: Diagnosis present

## 2019-05-02 DIAGNOSIS — R825 Elevated urine levels of drugs, medicaments and biological substances: Secondary | ICD-10-CM

## 2019-05-02 DIAGNOSIS — F1120 Opioid dependence, uncomplicated: Secondary | ICD-10-CM | POA: Diagnosis present

## 2019-05-02 HISTORY — PX: I & D EXTREMITY: SHX5045

## 2019-05-02 LAB — I-STAT BETA HCG BLOOD, ED (MC, WL, AP ONLY): I-stat hCG, quantitative: <5 m[IU]/mL (ref <5)

## 2019-05-02 LAB — CBC WITH DIFFERENTIAL/PLATELET
Abs Immature Granulocytes: 0.07 10*3/uL (ref 0.00–0.07)
Basophils Absolute: 0.1 10*3/uL (ref 0.0–0.1)
Basophils Relative: 0 %
Eosinophils Absolute: 0.1 10*3/uL (ref 0.0–0.5)
Eosinophils Relative: 0 %
HCT: 33.7 % — ABNORMAL LOW (ref 36.0–46.0)
Hemoglobin: 11 g/dL — ABNORMAL LOW (ref 12.0–15.0)
Immature Granulocytes: 0 %
Lymphocytes Relative: 20 %
Lymphs Abs: 3.7 10*3/uL (ref 0.7–4.0)
MCH: 28.3 pg (ref 26.0–34.0)
MCHC: 32.6 g/dL (ref 30.0–36.0)
MCV: 86.6 fL (ref 80.0–100.0)
Monocytes Absolute: 1.2 10*3/uL — ABNORMAL HIGH (ref 0.1–1.0)
Monocytes Relative: 7 %
Neutro Abs: 12.9 10*3/uL — ABNORMAL HIGH (ref 1.7–7.7)
Neutrophils Relative %: 73 %
Platelets: 339 10*3/uL (ref 150–400)
RBC: 3.89 MIL/uL (ref 3.87–5.11)
RDW: 13.2 % (ref 11.5–15.5)
WBC: 18 10*3/uL — ABNORMAL HIGH (ref 4.0–10.5)
nRBC: 0 % (ref 0.0–0.2)

## 2019-05-02 LAB — COMPREHENSIVE METABOLIC PANEL
ALT: 19 U/L (ref 0–44)
AST: 22 U/L (ref 15–41)
Albumin: 3.7 g/dL (ref 3.5–5.0)
Alkaline Phosphatase: 43 U/L (ref 38–126)
Anion gap: 11 (ref 5–15)
BUN: 7 mg/dL (ref 6–20)
CO2: 22 mmol/L (ref 22–32)
Calcium: 8.8 mg/dL — ABNORMAL LOW (ref 8.9–10.3)
Chloride: 104 mmol/L (ref 98–111)
Creatinine, Ser: 0.92 mg/dL (ref 0.44–1.00)
GFR calc Af Amer: >60 mL/min (ref >60)
GFR calc non Af Amer: >60 mL/min (ref >60)
Glucose, Bld: 108 mg/dL — ABNORMAL HIGH (ref 70–99)
Potassium: 3.7 mmol/L (ref 3.5–5.1)
Sodium: 137 mmol/L (ref 135–145)
Total Bilirubin: 0.6 mg/dL (ref 0.3–1.2)
Total Protein: 7.5 g/dL (ref 6.5–8.1)

## 2019-05-02 LAB — RAPID URINE DRUG SCREEN, HOSP PERFORMED
Amphetamines: POSITIVE — AB
Barbiturates: NOT DETECTED
Benzodiazepines: NOT DETECTED
Cocaine: POSITIVE — AB
Opiates: POSITIVE — AB
Tetrahydrocannabinol: NOT DETECTED

## 2019-05-02 LAB — LACTIC ACID, PLASMA: Lactic Acid, Venous: 1 mmol/L (ref 0.5–1.9)

## 2019-05-02 LAB — SARS CORONAVIRUS 2 BY RT PCR (HOSPITAL ORDER, PERFORMED IN ~~LOC~~ HOSPITAL LAB): SARS Coronavirus 2: NEGATIVE

## 2019-05-02 LAB — MRSA PCR SCREENING: MRSA by PCR: NEGATIVE

## 2019-05-02 SURGERY — IRRIGATION AND DEBRIDEMENT EXTREMITY
Anesthesia: General | Site: Arm Lower | Laterality: Left

## 2019-05-02 MED ORDER — DEXMEDETOMIDINE HCL IN NACL 200 MCG/50ML IV SOLN
INTRAVENOUS | Status: AC
  Filled 2019-05-02: qty 50

## 2019-05-02 MED ORDER — PHENYLEPHRINE 40 MCG/ML (10ML) SYRINGE FOR IV PUSH (FOR BLOOD PRESSURE SUPPORT)
PREFILLED_SYRINGE | INTRAVENOUS | Status: AC
  Filled 2019-05-02: qty 10

## 2019-05-02 MED ORDER — SODIUM CHLORIDE 0.9 % IV SOLN
INTRAVENOUS | Status: DC | PRN
  Administered 2019-05-02: 16:00:00 via INTRAVENOUS

## 2019-05-02 MED ORDER — ACETAMINOPHEN 650 MG RE SUPP: 650.0000 mg | Freq: Four times a day (QID) | RECTAL | Status: DC | PRN

## 2019-05-02 MED ORDER — ACETAMINOPHEN 160 MG/5ML PO SOLN: 1000.0000 mg | Freq: Once | ORAL | Status: DC | PRN

## 2019-05-02 MED ORDER — ONDANSETRON HCL 4 MG/2ML IJ SOLN
4.0000 mg | Freq: Four times a day (QID) | INTRAMUSCULAR | Status: DC | PRN
  Administered 2019-05-05: 4 mg via INTRAVENOUS
  Filled 2019-05-02: qty 2

## 2019-05-02 MED ORDER — IBUPROFEN 600 MG PO TABS
600.0000 mg | ORAL_TABLET | Freq: Three times a day (TID) | ORAL | Status: DC
  Administered 2019-05-02 – 2019-05-05 (×8): 600 mg via ORAL
  Filled 2019-05-02 (×8): qty 1

## 2019-05-02 MED ORDER — SODIUM CHLORIDE 0.9% FLUSH: 3.0000 mL | Freq: Once | INTRAVENOUS | Status: DC

## 2019-05-02 MED ORDER — ONDANSETRON HCL 4 MG/2ML IJ SOLN: 4.0000 mg | Freq: Once | INTRAMUSCULAR | Status: DC

## 2019-05-02 MED ORDER — ONDANSETRON HCL 4 MG/2ML IJ SOLN
INTRAMUSCULAR | Status: AC
  Filled 2019-05-02: qty 2

## 2019-05-02 MED ORDER — OXYCODONE HCL 5 MG/5ML PO SOLN: 5.0000 mg | Freq: Once | ORAL | Status: DC | PRN

## 2019-05-02 MED ORDER — CHLORHEXIDINE GLUCONATE 4 % EX LIQD: 60.0000 mL | Freq: Once | CUTANEOUS | Status: DC

## 2019-05-02 MED ORDER — SUCCINYLCHOLINE CHLORIDE 200 MG/10ML IV SOSY
PREFILLED_SYRINGE | INTRAVENOUS | Status: AC
  Filled 2019-05-02: qty 10

## 2019-05-02 MED ORDER — PROPOFOL 10 MG/ML IV BOLUS
INTRAVENOUS | Status: DC | PRN
  Administered 2019-05-02: 200 mg via INTRAVENOUS

## 2019-05-02 MED ORDER — SODIUM CHLORIDE 0.9 % IV SOLN
INTRAVENOUS | Status: AC
  Filled 2019-05-02: qty 500000

## 2019-05-02 MED ORDER — LEVOFLOXACIN IN D5W 750 MG/150ML IV SOLN
750.0000 mg | Freq: Once | INTRAVENOUS | Status: AC
  Administered 2019-05-02: 750 mg via INTRAVENOUS
  Filled 2019-05-02: qty 150

## 2019-05-02 MED ORDER — DEXAMETHASONE SODIUM PHOSPHATE 10 MG/ML IJ SOLN
INTRAMUSCULAR | Status: AC
  Filled 2019-05-02: qty 1

## 2019-05-02 MED ORDER — VANCOMYCIN HCL 10 G IV SOLR
1500.0000 mg | Freq: Once | INTRAVENOUS | Status: AC
  Administered 2019-05-02: 11:00:00 1500 mg via INTRAVENOUS
  Filled 2019-05-02: qty 1500

## 2019-05-02 MED ORDER — ACETAMINOPHEN 325 MG PO TABS: 650.0000 mg | ORAL_TABLET | Freq: Four times a day (QID) | ORAL | Status: DC | PRN

## 2019-05-02 MED ORDER — POVIDONE-IODINE 10 % EX SWAB: 2.0000 "application " | Freq: Once | CUTANEOUS | Status: DC

## 2019-05-02 MED ORDER — ONDANSETRON HCL 4 MG PO TABS: 4.0000 mg | ORAL_TABLET | Freq: Four times a day (QID) | ORAL | Status: DC | PRN

## 2019-05-02 MED ORDER — LIDOCAINE 2% (20 MG/ML) 5 ML SYRINGE
INTRAMUSCULAR | Status: AC
  Filled 2019-05-02: qty 5

## 2019-05-02 MED ORDER — SODIUM CHLORIDE 0.9 % IV BOLUS
1000.0000 mL | Freq: Once | INTRAVENOUS | Status: AC
  Administered 2019-05-02: 1000 mL via INTRAVENOUS

## 2019-05-02 MED ORDER — BUPIVACAINE HCL (PF) 0.25 % IJ SOLN
INTRAMUSCULAR | Status: AC
  Filled 2019-05-02: qty 30

## 2019-05-02 MED ORDER — BUPIVACAINE HCL (PF) 0.25 % IJ SOLN
INTRAMUSCULAR | Status: DC | PRN
  Administered 2019-05-02: 30 mL

## 2019-05-02 MED ORDER — FENTANYL CITRATE (PF) 250 MCG/5ML IJ SOLN
INTRAMUSCULAR | Status: DC | PRN
  Administered 2019-05-02: 200 ug via INTRAVENOUS
  Administered 2019-05-02: 50 ug via INTRAVENOUS

## 2019-05-02 MED ORDER — LIDOCAINE 2% (20 MG/ML) 5 ML SYRINGE
INTRAMUSCULAR | Status: DC | PRN
  Administered 2019-05-02: 60 mg via INTRAVENOUS

## 2019-05-02 MED ORDER — FENTANYL CITRATE (PF) 250 MCG/5ML IJ SOLN
INTRAMUSCULAR | Status: AC
  Filled 2019-05-02: qty 5

## 2019-05-02 MED ORDER — ONDANSETRON HCL 4 MG/2ML IJ SOLN
INTRAMUSCULAR | Status: DC | PRN
  Administered 2019-05-02: 4 mg via INTRAVENOUS

## 2019-05-02 MED ORDER — HYDROMORPHONE HCL 1 MG/ML IJ SOLN: 0.2500 mg | INTRAMUSCULAR | Status: DC | PRN

## 2019-05-02 MED ORDER — OXYCODONE HCL 5 MG PO TABS: 5.0000 mg | ORAL_TABLET | Freq: Once | ORAL | Status: DC | PRN

## 2019-05-02 MED ORDER — DEXAMETHASONE SODIUM PHOSPHATE 10 MG/ML IJ SOLN
INTRAMUSCULAR | Status: DC | PRN
  Administered 2019-05-02: 5 mg via INTRAVENOUS

## 2019-05-02 MED ORDER — HYDROMORPHONE HCL 1 MG/ML IJ SOLN: 1.0000 mg | INTRAMUSCULAR | Status: DC | PRN

## 2019-05-02 MED ORDER — MORPHINE SULFATE (PF) 4 MG/ML IV SOLN
4.0000 mg | Freq: Once | INTRAVENOUS | Status: AC
  Administered 2019-05-02: 4 mg via INTRAVENOUS
  Filled 2019-05-02: qty 1

## 2019-05-02 MED ORDER — VANCOMYCIN HCL IN DEXTROSE 1-5 GM/200ML-% IV SOLN
1000.0000 mg | Freq: Two times a day (BID) | INTRAVENOUS | Status: DC
  Administered 2019-05-02 – 2019-05-04 (×5): 1000 mg via INTRAVENOUS
  Filled 2019-05-02 (×7): qty 200

## 2019-05-02 MED ORDER — PROPOFOL 10 MG/ML IV BOLUS
INTRAVENOUS | Status: AC
  Filled 2019-05-02: qty 20

## 2019-05-02 MED ORDER — SENNA 8.6 MG PO TABS
1.0000 | ORAL_TABLET | Freq: Two times a day (BID) | ORAL | Status: DC
  Administered 2019-05-02 – 2019-05-05 (×6): 8.6 mg via ORAL
  Filled 2019-05-02 (×6): qty 1

## 2019-05-02 MED ORDER — SUCCINYLCHOLINE CHLORIDE 200 MG/10ML IV SOSY
PREFILLED_SYRINGE | INTRAVENOUS | Status: DC | PRN
  Administered 2019-05-02: 100 mg via INTRAVENOUS

## 2019-05-02 MED ORDER — 0.9 % SODIUM CHLORIDE (POUR BTL) OPTIME
TOPICAL | Status: DC | PRN
  Administered 2019-05-02 (×2): 1000 mL

## 2019-05-02 MED ORDER — SODIUM CHLORIDE 0.9 % IV SOLN
2.0000 g | INTRAVENOUS | Status: DC
  Administered 2019-05-02 – 2019-05-04 (×3): 2 g via INTRAVENOUS
  Filled 2019-05-02 (×4): qty 20

## 2019-05-02 MED ORDER — CLINDAMYCIN PHOSPHATE 900 MG/50ML IV SOLN
900.0000 mg | INTRAVENOUS | Status: AC
  Administered 2019-05-02: 900 mg via INTRAVENOUS
  Filled 2019-05-02: qty 50

## 2019-05-02 MED ORDER — HYDROCODONE-ACETAMINOPHEN 5-325 MG PO TABS
1.0000 | ORAL_TABLET | ORAL | Status: DC | PRN
  Administered 2019-05-02 – 2019-05-04 (×5): 2 via ORAL
  Filled 2019-05-02 (×5): qty 2

## 2019-05-02 MED ORDER — MIDAZOLAM HCL 2 MG/2ML IJ SOLN
INTRAMUSCULAR | Status: DC | PRN
  Administered 2019-05-02: 2 mg via INTRAVENOUS

## 2019-05-02 MED ORDER — PHENYLEPHRINE HCL (PRESSORS) 10 MG/ML IV SOLN
INTRAVENOUS | Status: DC | PRN
  Administered 2019-05-02: 80 ug via INTRAVENOUS

## 2019-05-02 MED ORDER — ACETAMINOPHEN 500 MG PO TABS: 1000.0000 mg | ORAL_TABLET | Freq: Once | ORAL | Status: DC | PRN

## 2019-05-02 MED ORDER — ACETAMINOPHEN 10 MG/ML IV SOLN: 1000.0000 mg | Freq: Once | INTRAVENOUS | Status: DC | PRN

## 2019-05-02 MED ORDER — LACTATED RINGERS IV SOLN
INTRAVENOUS | Status: DC
  Administered 2019-05-02: 18:00:00 via INTRAVENOUS

## 2019-05-02 MED ORDER — DEXMEDETOMIDINE HCL 200 MCG/2ML IV SOLN
INTRAVENOUS | Status: DC | PRN
  Administered 2019-05-02: 12 ug via INTRAVENOUS

## 2019-05-02 MED ORDER — MIDAZOLAM HCL 2 MG/2ML IJ SOLN
INTRAMUSCULAR | Status: AC
  Filled 2019-05-02: qty 2

## 2019-05-02 SURGICAL SUPPLY — 41 items
BAG DECANTER FOR FLEXI CONT (MISCELLANEOUS) ×3 IMPLANT
BANDAGE ACE 3X5.8 VEL STRL LF (GAUZE/BANDAGES/DRESSINGS) ×2 IMPLANT
BANDAGE ACE 4X5 VEL STRL LF (GAUZE/BANDAGES/DRESSINGS) IMPLANT
BNDG GAUZE ELAST 4 BULKY (GAUZE/BANDAGES/DRESSINGS) ×2 IMPLANT
CORDS BIPOLAR (ELECTRODE) IMPLANT
COVER WAND RF STERILE (DRAPES) ×3 IMPLANT
CUFF TOURNIQUET SINGLE 18IN (TOURNIQUET CUFF) ×2 IMPLANT
DRAPE SURG 17X23 STRL (DRAPES) ×3 IMPLANT
ELECT REM PT RETURN 9FT ADLT (ELECTROSURGICAL) ×3
ELECTRODE REM PT RTRN 9FT ADLT (ELECTROSURGICAL) IMPLANT
FLUID NSS /IRRIG 3000 ML XXX (IV SOLUTION) IMPLANT
GAUZE PACKING IODOFORM 1/4X5 (PACKING) IMPLANT
GAUZE SPONGE 4X4 12PLY STRL (GAUZE/BANDAGES/DRESSINGS) ×3 IMPLANT
GAUZE XEROFORM 1X8 LF (GAUZE/BANDAGES/DRESSINGS) ×1 IMPLANT
GLOVE BIOGEL M 8.0 STRL (GLOVE) ×3 IMPLANT
GOWN STRL REUS W/ TWL LRG LVL3 (GOWN DISPOSABLE) ×2 IMPLANT
GOWN STRL REUS W/TWL LRG LVL3 (GOWN DISPOSABLE) ×6
HANDPIECE INTERPULSE COAX TIP (DISPOSABLE)
KIT BASIN OR (CUSTOM PROCEDURE TRAY) ×3 IMPLANT
KIT TURNOVER KIT B (KITS) ×3 IMPLANT
MANIFOLD NEPTUNE II (INSTRUMENTS) ×3 IMPLANT
NDL HYPO 25GX1X1/2 BEV (NEEDLE) IMPLANT
NEEDLE HYPO 25GX1X1/2 BEV (NEEDLE) ×3 IMPLANT
NS IRRIG 1000ML POUR BTL (IV SOLUTION) ×3 IMPLANT
PACK ORTHO EXTREMITY (CUSTOM PROCEDURE TRAY) ×3 IMPLANT
PAD ARMBOARD 7.5X6 YLW CONV (MISCELLANEOUS) ×6 IMPLANT
PAD CAST 4YDX4 CTTN HI CHSV (CAST SUPPLIES) IMPLANT
PADDING CAST COTTON 4X4 STRL (CAST SUPPLIES)
SET CYSTO W/LG BORE CLAMP LF (SET/KITS/TRAYS/PACK) IMPLANT
SET HNDPC FAN SPRY TIP SCT (DISPOSABLE) IMPLANT
SOAP 2 % CHG 4 OZ (WOUND CARE) ×3 IMPLANT
SPONGE LAP 18X18 RF (DISPOSABLE) IMPLANT
SPONGE LAP 4X18 RFD (DISPOSABLE) ×3 IMPLANT
SWAB CULTURE ESWAB REG 1ML (MISCELLANEOUS) ×2 IMPLANT
SYR CONTROL 10ML LL (SYRINGE) ×2 IMPLANT
TOWEL OR 17X24 6PK STRL BLUE (TOWEL DISPOSABLE) ×3 IMPLANT
TOWEL OR 17X26 10 PK STRL BLUE (TOWEL DISPOSABLE) ×3 IMPLANT
TUBE CONNECTING 12'X1/4 (SUCTIONS) ×1
TUBE CONNECTING 12X1/4 (SUCTIONS) ×2 IMPLANT
WATER STERILE IRR 1000ML POUR (IV SOLUTION) ×3 IMPLANT
YANKAUER SUCT BULB TIP NO VENT (SUCTIONS) ×3 IMPLANT

## 2019-05-02 NOTE — Progress Notes (Signed)
One ring removed and placed in bag on chart.

## 2019-05-02 NOTE — ED Triage Notes (Addendum)
Patient reports "abcess" to Left wrist x2 days, states she tried to open it with a sterile needle and used warm compresses with no relief. Left wrist red and swollen. Pt acting very erratic in triage, unable to sit still.

## 2019-05-02 NOTE — Consult Note (Signed)
Reason for Consult:Left arm cellulitis Referring Physician: D Ray  [name] is an 33 y.o. female.  HPI: Chin[place] injected drugs into her left wrist yesterday. Very soon after she began to have redness and swelling. It has steadily gotten worse. She has tried hot compresses and attempted to drain it with a needle to no avail. She describes a hx/o similar infection in the right wrist. She has no medical problems other that her substance abuse and PTSD. She is RHD.  Past Medical History:  Diagnosis Date  . Ankle pain, chronic   . Anxiety   . Depression   . Depression with anxiety   . Dilated bile duct 07/2017   CBD up to 11 mm on ultrasound and MRCP.  no choledocholithiasis or gallstones.    . [name] antibody positive in blood 07/2017  . Polysubstance abuse ([place])   . PTSD (post-traumatic stress disorder)     Past Surgical History:  Procedure Laterality Date  . ANKLE SURGERY    . APPENDECTOMY    . EUS Right 07/20/2017   Procedure: ESOPHAGEAL ENDOSCOPIC ULTRASOUND (EUS) RADIAL;  Surgeon: Milus Banister, MD;  Location: [facility] ENDOSCOPY;  Service: Endoscopy;  Laterality: Right;  . TUBAL LIGATION     2009  . TUBAL LIGATION      Family History  Family history unknown: Yes    Social History:  reports that she has been smoking cigarettes. She has a 8.00 pack-year smoking history. She has never used smokeless tobacco. She reports current drug use. Drugs: Heroin, Cocaine, and Methamphetamines. She reports that she does not drink alcohol.  Allergies:  Allergies  Allergen Reactions  . Penicillins Cross Reactors Hives    Has patient had a PCN reaction causing immediate rash, facial/tongue/throat swelling, SOB or lightheadedness with hypotension: Yes Has patient had a PCN reaction causing severe rash involving mucus membranes or skin necrosis: Yes Has patient had a PCN reaction that required hospitalization unknown Has patient had a PCN reaction occurring within the last 10 years:  unknown If all of the above answers are "NO", then may proceed with Cephalosporin use. No issues with cephalosporins     Medications: I have reviewed the patient's current medications.  Results for orders placed or performed during the hospital encounter of 05/02/19 (from the past 48 hour(s))  Lactic acid, plasma     Status: None   Collection Time: 05/02/19  5:59 AM  Result Value Ref Range   Lactic Acid, Venous 1.0 0.5 - 1.9 mmol/L    Comment: Performed at [facility], [address], [place], [place] [address]  Comprehensive metabolic panel     Status: Abnormal   Collection Time: 05/02/19  5:59 AM  Result Value Ref Range   Sodium 137 135 - 145 mmol/L   Potassium 3.7 3.5 - 5.1 mmol/L   Chloride 104 98 - 111 mmol/L   CO2 22 22 - 32 mmol/L   Glucose, Bld 108 (H) 70 - 99 mg/dL   BUN 7 6 - 20 mg/dL   Creatinine, Ser 0.92 0.44 - 1.00 mg/dL   Calcium 8.8 (L) 8.9 - 10.3 mg/dL   Total Protein 7.5 6.5 - 8.1 g/dL   Albumin 3.7 3.5 - 5.0 g/dL   AST 22 15 - 41 U/L   ALT 19 0 - 44 U/L   Alkaline Phosphatase 43 38 - 126 U/L   Total Bilirubin 0.6 0.3 - 1.2 mg/dL   GFR calc non Af Amer >60 >60 mL/min  GFR calc Af Amer >60 >60 mL/min   Anion gap 11 5 - 15    Comment: Performed at [facility] [address] [place], [place] [address]  CBC with Differential     Status: Abnormal   Collection Time: 05/02/19  5:59 AM  Result Value Ref Range   WBC 18.0 (H) 4.0 - 10.5 K/uL   RBC 3.89 3.87 - 5.11 MIL/uL   Hemoglobin 11.0 (L) 12.0 - 15.0 g/dL   HCT 33.7 (L) 36.0 - 46.0 %   MCV 86.6 80.0 - 100.0 fL   MCH 28.3 26.0 - 34.0 pg   MCHC 32.6 30.0 - 36.0 g/dL   RDW 13.2 11.5 - 15.5 %   Platelets 339 150 - 400 K/uL   nRBC 0.0 0.0 - 0.2 %   Neutrophils Relative % 73 %   Neutro Abs 12.9 (H) 1.7 - 7.7 K/uL   Lymphocytes Relative 20 %   Lymphs Abs 3.7 0.7 - 4.0 K/uL   Monocytes Relative 7 %   Monocytes Absolute 1.2 (H) 0.1 - 1.0 K/uL   Eosinophils Relative 0 %   Eosinophils Absolute  0.1 0.0 - 0.5 K/uL   Basophils Relative 0 %   Basophils Absolute 0.1 0.0 - 0.1 K/uL   Immature Granulocytes 0 %   Abs Immature Granulocytes 0.07 0.00 - 0.07 K/uL    Comment: Performed at [facility], [place] [address], [place], [place] [address]  I-Stat beta hCG blood, ED     Status: None   Collection Time: 05/02/19  6:24 AM  Result Value Ref Range   I-stat hCG, quantitative <5.0 <5 mIU/mL   Comment 3            Comment:   GEST. AGE      CONC.  (mIU/mL)   <=1 WEEK        5 - 50     2 WEEKS       50 - 500     3 WEEKS       100 - 10,000     4 WEEKS     1,000 - 30,000        FEMALE AND NON-PREGNANT FEMALE:     LESS THAN 5 mIU/mL     No results found.  Review of Systems  Constitutional: Positive for fever. Negative for chills and weight loss.  HENT: Negative for ear discharge, ear pain, hearing loss and tinnitus.   Eyes: Negative for blurred vision, double vision, photophobia and pain.  Respiratory: Negative for cough, sputum production and shortness of breath.   Cardiovascular: Negative for chest pain.  Gastrointestinal: Negative for abdominal pain, nausea and vomiting.  Genitourinary: Negative for dysuria, flank pain, frequency and urgency.  Musculoskeletal: Positive for joint pain (Left wrist/FA). Negative for back pain, falls, myalgias and neck pain.  Neurological: Negative for dizziness, tingling, sensory change, focal weakness, loss of consciousness and headaches.  Endo/Heme/Allergies: Does not bruise/bleed easily.  Psychiatric/Behavioral: Negative for depression, memory loss and substance abuse. The patient is not nervous/anxious.    Blood pressure 116/62, pulse (!) 103, temperature 99.9 F (37.7 C), temperature source Oral, resp. rate 16, weight 77.1 kg, SpO2 99 %. Physical Exam  Constitutional: She appears well-developed and well-nourished. No distress.  HENT:  Head: Normocephalic and atraumatic.  Eyes: Conjunctivae are normal. Right eye exhibits no discharge. Left  eye exhibits no discharge. No scleral icterus.  Neck: Normal range of motion.  Cardiovascular: Normal rate and regular rhythm.  Respiratory: Effort normal. No respiratory distress.  Musculoskeletal:     Comments: Left shoulder, elbow, wrist, digits- no skin wounds, erythema, induration, severe TTP proximal to wrist, no obvious fluctuance, able to PROM wrist ~30 degrees, no instability, no blocks to motion  Sens  Ax/R/M/U intact  Mot   Ax/ R/ PIN/ M/ AIN/ U intact  Rad 2+  Neurological: She is alert.  Skin: Skin is warm and dry. She is not diaphoretic.  Psychiatric: She has a normal mood and affect. Her behavior is normal.    Assessment/Plan: Left wrist/FA infection -- Will get [place] to r/o fluid collection. Will need medical admission for IV abx. If fluid collection present will need I&D; please keep NPO for now. Substance abuse PTSD    Lisette Abu, PA-C Orthopedic Surgery [phone] 05/02/2019, 10:43 AM

## 2019-05-02 NOTE — Op Note (Signed)
NAME: [name], [name] MEDICAL RECORD [id number] ACCOUNT [id number] DATE OF BIRTH:25-Oct-1986 FACILITY: [facility] LOCATION: [facility] PHYSICIAN:Shahara Hartsfield C. Avonda Toso, MD  OPERATIVE REPORT  DATE OF PROCEDURE:  05/02/2019  PREOPERATIVE DIAGNOSES:  Cellulitis and abscess of left wrist.  POSTOPERATIVE DIAGNOSES:  Cellulitis and abscess of left wrist.  PROCEDURE:  Incision and drainage of abscess, left wrist.  ANESTHESIA:  General.  COMPLICATIONS:  No acute complications.  SPECIMENS:  Cultures were taken.  ESTIMATED BLOOD LOSS:  Minimal.    INDICATIONS:  The patient is a 33 year old female with history of IV drug abuse who presents to the ER with pain and swelling in her left forearm.  She has previously injected IV drugs in this area, concern for infection.  Ultrasound confirmed an abscess  in the subcutaneous tissues of the left wrist.  She was consented and advised for surgery.  DESCRIPTION OF PROCEDURE:  The patient was taken to the operating room and placed supine on the operating room table.  Anesthesia was administered.  A timeout was performed.  Preoperative antibiotics were given.  The arm was prepped and draped in normal  sterile fashion.  The tourniquet was used on the upper arm.  The arm was exsanguinated, and the tourniquet was inflated to 250 mmHg.  An incision overlying the area of fluctuance on the volar wrist was made.  Immediately, there was gross foul-smelling  pus.  This was evacuated.  Once this was done, probing of the wound was then performed.  The deep fascia was opened.  There did not appear to be any deep abscess into the flexor tendon musculature.  It seemed to be contained to the subcutaneous space.   The incision was opened slightly to allow adequate drainage.  Thorough irrigation of this area with 2 L of saline solution was then performed.  The tourniquet was then released.  Hemostasis was obtained.  The wound was then packed with a moist 4 x 4, and  a sterile dry dressing  was placed.  Thirty mL of Marcaine were infiltrated around the wound and the wound for postoperative pain control.  The patient tolerated the procedure well.  LN/NUANCE  D:05/02/2019 T:05/02/2019 JOB:[id number]

## 2019-05-02 NOTE — Progress Notes (Signed)
I have seen and examined this patient and agree with the physical findings described by PA Jacqulynn Cadet.  I will proceed with I&D; pt's questions answered.

## 2019-05-02 NOTE — Transfer of Care (Signed)
Immediate Anesthesia Transfer of Care Note  Patient: [name]   Procedure(s) Performed: IRRIGATION AND DEBRIDEMENT LEFT FOREARM (Left Arm Lower)  Patient Location: PACU  Anesthesia Type:General  Level of Consciousness: awake, alert , oriented and patient cooperative  Airway & Oxygen Therapy: Patient Spontanous Breathing and Patient connected to nasal cannula oxygen  Post-op Assessment: Report given to RN, Post -op Vital signs reviewed and stable and Patient moving all extremities  Post vital signs: Reviewed and stable  Last Vitals:  Vitals Value Taken Time  BP 123/54 05/02/2019  4:47 PM  Temp    Pulse 81 05/02/2019  4:54 PM  Resp 17 05/02/2019  4:54 PM  SpO2 100 % 05/02/2019  4:54 PM  Vitals shown include unvalidated device data.  Last Pain:  Vitals:   05/02/19 1652  TempSrc:   PainSc: (P) Asleep         Complications: No apparent anesthesia complications

## 2019-05-02 NOTE — ED Notes (Signed)
ED TO INPATIENT HANDOFF REPORT  ED Nurse Name and Phone #: [facility] [phone]  S Name/Age/Gender [name] 33 y.o. female Room/Bed: 029C/029C  Code Status   Code Status: Full Code  Home/SNF/Other Home Patient oriented to: self, place, time and situation Is this baseline? Yes   Triage Complete: Triage complete  Chief Complaint abcess  Triage Note Patient reports "abcess" to Left wrist x2 days, states she tried to open it with a sterile needle and used warm compresses with no relief. Left wrist red and swollen. Pt acting very erratic in triage, unable to sit still.    Allergies Allergies  Allergen Reactions  . Penicillins Cross Reactors Hives    Has patient had a PCN reaction causing immediate rash, facial/tongue/throat swelling, SOB or lightheadedness with hypotension: Yes Has patient had a PCN reaction causing severe rash involving mucus membranes or skin necrosis: Yes Has patient had a PCN reaction that required hospitalization unknown Has patient had a PCN reaction occurring within the last 10 years: unknown If all of the above answers are "NO", then may proceed with Cephalosporin use. No issues with cephalosporins     Level of Care/Admitting Diagnosis ED Disposition    ED Disposition Condition [facility] Area: [place] [[id number]]  Level of Care: Telemetry Surgical [105]  Covid Evaluation: Confirmed COVID Negative  Diagnosis: Cellulitis of left wrist [[id number]]  Admitting Physician: Oda Kilts [[id number]]  Attending Physician: Oda Kilts [[id number]]  Estimated length of stay: past midnight tomorrow  Certification:: I certify this patient will need inpatient services for at least 2 midnights  PT Class (Do Not Modify): Inpatient [101]  PT Acc Code (Do Not Modify): Private [1]       B Medical/Surgery History Past Medical History:  Diagnosis Date  . Ankle pain, chronic   . Anxiety   . Depression   . Depression  with anxiety   . Dilated bile duct 07/2017   CBD up to 11 mm on ultrasound and MRCP.  no choledocholithiasis or gallstones.    . [name] antibody positive in blood 07/2017  . Polysubstance abuse ([place])   . PTSD (post-traumatic stress disorder)    Past Surgical History:  Procedure Laterality Date  . ANKLE SURGERY    . APPENDECTOMY    . EUS Right 07/20/2017   Procedure: ESOPHAGEAL ENDOSCOPIC ULTRASOUND (EUS) RADIAL;  Surgeon: Milus Banister, MD;  Location: [facility];  Service: Endoscopy;  Laterality: Right;  . TUBAL LIGATION     2009  . TUBAL LIGATION       A IV Location/Drains/Wounds Patient Lines/Drains/Airways Status   Active Line/Drains/Airways    Name:   Placement date:   Placement time:   Site:   Days:   Peripheral IV 05/02/19 Anterior;Right Forearm   05/02/19    1020    Forearm   less than 1          Intake/Output Last 24 hours No intake or output data in the 24 hours ending 05/02/19 1336  Labs/Imaging Results for orders placed or performed during the hospital encounter of 05/02/19 (from the past 48 hour(s))  Lactic acid, plasma     Status: None   Collection Time: 05/02/19  5:59 AM  Result Value Ref Range   Lactic Acid, Venous 1.0 0.5 - 1.9 mmol/L    Comment: Performed at [facility], [address] [place], [place] [address]  Comprehensive metabolic panel     Status: Abnormal  Collection Time: 05/02/19  5:59 AM  Result Value Ref Range   Sodium 137 135 - 145 mmol/L   Potassium 3.7 3.5 - 5.1 mmol/L   Chloride 104 98 - 111 mmol/L   CO2 22 22 - 32 mmol/L   Glucose, Bld 108 (H) 70 - 99 mg/dL   BUN 7 6 - 20 mg/dL   Creatinine, Ser 0.92 0.44 - 1.00 mg/dL   Calcium 8.8 (L) 8.9 - 10.3 mg/dL   Total Protein 7.5 6.5 - 8.1 g/dL   Albumin 3.7 3.5 - 5.0 g/dL   AST 22 15 - 41 U/L   ALT 19 0 - 44 U/L   Alkaline Phosphatase 43 38 - 126 U/L   Total Bilirubin 0.6 0.3 - 1.2 mg/dL   GFR calc non Af Amer >60 >60 mL/min   GFR calc Af Amer >60 >60 mL/min    Anion gap 11 5 - 15    Comment: Performed at [facility], [address], [place], [place] [address]  CBC with Differential     Status: Abnormal   Collection Time: 05/02/19  5:59 AM  Result Value Ref Range   WBC 18.0 (H) 4.0 - 10.5 K/uL   RBC 3.89 3.87 - 5.11 MIL/uL   Hemoglobin 11.0 (L) 12.0 - 15.0 g/dL   HCT 33.7 (L) 36.0 - 46.0 %   MCV 86.6 80.0 - 100.0 fL   MCH 28.3 26.0 - 34.0 pg   MCHC 32.6 30.0 - 36.0 g/dL   RDW 13.2 11.5 - 15.5 %   Platelets 339 150 - 400 K/uL   nRBC 0.0 0.0 - 0.2 %   Neutrophils Relative % 73 %   Neutro Abs 12.9 (H) 1.7 - 7.7 K/uL   Lymphocytes Relative 20 %   Lymphs Abs 3.7 0.7 - 4.0 K/uL   Monocytes Relative 7 %   Monocytes Absolute 1.2 (H) 0.1 - 1.0 K/uL   Eosinophils Relative 0 %   Eosinophils Absolute 0.1 0.0 - 0.5 K/uL   Basophils Relative 0 %   Basophils Absolute 0.1 0.0 - 0.1 K/uL   Immature Granulocytes 0 %   Abs Immature Granulocytes 0.07 0.00 - 0.07 K/uL    Comment: Performed at [facility], [place] [address], [place], [place] [address]  I-Stat beta hCG blood, ED     Status: None   Collection Time: 05/02/19  6:24 AM  Result Value Ref Range   I-stat hCG, quantitative <5.0 <5 mIU/mL   Comment 3            Comment:   GEST. AGE      CONC.  (mIU/mL)   <=1 WEEK        5 - 50     2 WEEKS       50 - 500     3 WEEKS       100 - 10,000     4 WEEKS     1,000 - 30,000        FEMALE AND NON-PREGNANT FEMALE:     LESS THAN 5 mIU/mL   SARS Coronavirus 2 (CEPHEID - Performed in [place] hospital lab), Hosp Order     Status: None   Collection Time: 05/02/19  9:50 AM  Result Value Ref Range   SARS Coronavirus 2 NEGATIVE NEGATIVE    Comment: (NOTE) SARS CoV 2 target nucleic acids are NOT DETECTED. The SARS CoV 2 RNA is generally detectable in upper and lower respiratory specimens during the  acute phase of infection. The lowest concentration of SARS CoV 2 viral copies this assay can detect is 250 copies per mL. A negative result does not  preclude SARS CoV 2 infection and should not be used as the sole basis for treatment or other patient management decisions. A negative result may occur with improper specimen collection and handling, submission  of specimen other than nasopharyngeal swab, presence of viral mutation(s) within the areas targeted by this assay, and inadequate number of viral copies (less than 250 copies per mL). A negative result must be combined with clinical observations, patient history,  and epidemiological information. The expected result is Negative. Fact Sheet for Patients:   https [employer] 540086 download Fact Sheet for Healthcare Providers:   https [employer] media 947-017-4742  download This test is not yet approved or cleared by the [place] and  has been authorized for detection and/or diagnosis of SARS CoV 2 by FDA under an Emergency Use Authorization (EUA).  This EUA will remain in effect (meaning this test can be used) for the duration of  the COVID19 declaration under Section 564(b)(1) of the Act, 21 U.S.C.  section 360bbb-3(b)(1), unless the authorization is terminated or revoked sooner. Performed at [facility], [place] [address] [place], [place] [address]   Urine rapid drug screen (hosp performed)     Status: Abnormal   Collection Time: 05/02/19 10:53 AM  Result Value Ref Range   Opiates POSITIVE (A) NONE DETECTED   Cocaine POSITIVE (A) NONE DETECTED   Benzodiazepines NONE DETECTED NONE DETECTED   Amphetamines POSITIVE (A) NONE DETECTED   Tetrahydrocannabinol NONE DETECTED NONE DETECTED   Barbiturates NONE DETECTED NONE DETECTED    Comment: (NOTE) DRUG SCREEN FOR MEDICAL PURPOSES ONLY.  IF CONFIRMATION IS NEEDED FOR ANY PURPOSE, NOTIFY LAB WITHIN 5 DAYS. LOWEST DETECTABLE LIMITS FOR URINE DRUG SCREEN Drug Class                     Cutoff (ng/mL) Amphetamine and metabolites    1000 Barbiturate and metabolites    200 Benzodiazepine                  124 Tricyclics and metabolites     300 Opiates and metabolites        300 Cocaine and metabolites        300 THC                            50 Performed at [facility], [place] [address], [place],  [address]    Dg Wrist Complete Left  Result Date: 05/02/2019 CLINICAL DATA:  Swelling and inflammation.  IV drug use. EXAM: LEFT WRIST - COMPLETE 3+ VIEW COMPARISON:  None. FINDINGS: Volar soft tissue swelling. No bone or joint abnormality. No radiopaque foreign object. IMPRESSION: Volar soft tissue swelling. Electronically Signed   By: Nelson Chimes M.D.   On: 05/02/2019 11:03   [place] Lt Upper Extrem Ltd Soft Tissue Non Vascular  Result Date: 05/02/2019 CLINICAL DATA:  Distal forearm cellulitis. EXAM: ULTRASOUND LEFT UPPER EXTREMITY LIMITED TECHNIQUE: Ultrasound examination of the upper extremity soft tissues was performed in the area of clinical concern. COMPARISON:  None FINDINGS: Real-time sonography of the left forearm was performed. Severe soft tissue swelling along the anterior aspect of the distal forearm. Complex fluid collection measuring 3.9 x 1.6 x 3 cm concerning for an abscess. IMPRESSION: 1. Cellulitis of  the distal forearm with a 3.9 x 1.6 x 3 cm abscess in the distal anterior forearm. Electronically Signed   By: Kathreen Devoid   On: 05/02/2019 11:23    Pending Labs Unresulted Labs (From admission, onward)    Start     Ordered   05/03/19 0500  CBC  Tomorrow morning,   R     05/02/19 1312   05/03/19 7062  Basic metabolic panel  Tomorrow morning,   R     05/02/19 1312   05/02/19 1327  MRSA PCR Screening  ONCE - STAT,   R     05/02/19 1326   05/02/19 0957  HIV antibody  Once,   STAT     05/02/19 0956   05/02/19 0948  Blood Culture (routine x 2)  BLOOD CULTURE X 2,   STAT     05/02/19 0949          Vitals/Pain Today's Vitals   05/02/19 1216 05/02/19 1239 05/02/19 1245 05/02/19 1259  BP:  132/76 (!) 184/155   Pulse:    93  Resp:  (!) 21 (!) 24 18  Temp:       TempSrc:      SpO2:   99% 99%  Weight:      PainSc: 10-Worst pain ever       Isolation Precautions No active isolations  Medications Medications  sodium chloride flush (NS) 0.9 % injection 3 mL (3 mLs Intravenous Not Given 05/02/19 1237)  vancomycin (VANCOCIN) IVPB 1000 mg/200 mL premix (has no administration in time range)  ondansetron (ZOFRAN) injection 4 mg (4 mg Intravenous Not Given 05/02/19 1238)  acetaminophen (TYLENOL) tablet 650 mg (has no administration in time range)    Or  acetaminophen (TYLENOL) suppository 650 mg (has no administration in time range)  senna (SENOKOT) tablet 8.6 mg (has no administration in time range)  ondansetron (ZOFRAN) tablet 4 mg (has no administration in time range)    Or  ondansetron (ZOFRAN) injection 4 mg (has no administration in time range)  HYDROmorphone (DILAUDID) injection 1 mg (has no administration in time range)  cefTRIAXone (ROCEPHIN) 2 g in sodium chloride 0.9 % 100 mL IVPB (has no administration in time range)  levofloxacin (LEVAQUIN) IVPB 750 mg (750 mg Intravenous New Bag/Given 05/02/19 1120)  sodium chloride 0.9 % bolus 1,000 mL (0 mLs Intravenous Stopped 05/02/19 1220)  vancomycin (VANCOCIN) 1,500 mg in sodium chloride 0.9 % 500 mL IVPB (1,500 mg Intravenous New Bag/Given 05/02/19 1126)  morphine 4 MG/ML injection 4 mg (4 mg Intravenous Given 05/02/19 1112)    Mobility walks Low fall risk   Focused Assessments Cardiac Assessment Handoff:    Lab Results  Component Value Date   CKTOTAL 213 08/25/2017   CKMB 6.5 (H) 07/16/2017   No results found for: DDIMER Does the Patient currently have chest pain? No     R Recommendations: See Admitting Provider Note  Report given to:   Additional Notes Pt has surgical abcess at left wrist.

## 2019-05-02 NOTE — ED Notes (Addendum)
Pt stets she has injected heroin, cocaine and "ice" since last night. Pt had used syringe with unknown substance and crack pipe in her bra. Same disposed of.

## 2019-05-02 NOTE — ED Notes (Signed)
Iv attempted withput success. 

## 2019-05-02 NOTE — Anesthesia Procedure Notes (Signed)
Procedure Name: Intubation Date/Time: 05/02/2019 4:19 PM Performed by: Kathryne Hitch, CRNA Pre-anesthesia Checklist: Patient identified, Emergency Drugs available, Suction available and Patient being monitored Patient Re-evaluated:Patient Re-evaluated prior to induction Oxygen Delivery Method: Circle system utilized Preoxygenation: Pre-oxygenation with 100% oxygen Induction Type: IV induction Ventilation: Mask ventilation without difficulty Laryngoscope Size: Miller and 2 Grade View: Grade I Tube type: Oral Tube size: 7.0 mm Number of attempts: 1 Airway Equipment and Method: Stylet and Oral airway Placement Confirmation: ETT inserted through vocal cords under direct vision,  positive ETCO2 and breath sounds checked- equal and bilateral Secured at: 23 cm Tube secured with: Tape Dental Injury: Teeth and Oropharynx as per pre-operative assessment

## 2019-05-02 NOTE — ED Notes (Signed)
Pt continues thrashing about in bed. Complains of continuous pain all overmaking moaning sounds with words "fuck and shit" pt then answers questions.

## 2019-05-02 NOTE — Progress Notes (Signed)
New Admission Note:   Arrival Method: Wheelchair  Mental Orientation:  Telemetry: Assessment: Completed Skin: intact, tattoo CH:[id number] forearm  Pain: 3/10  Tubes: none  Safety Measures: Safety Fall Prevention Plan has been given, discussed and signed Admission: Completed [facility] Orientation: Patient has been orientated to the room, unit and staff.  Family: none  Orders have been reviewed and implemented. Will continue to monitor the patient. Call light has been placed within reach and bed alarm has been activated.   Ardelia Wrede RN [facility] Phone: [phone]

## 2019-05-02 NOTE — Anesthesia Preprocedure Evaluation (Signed)
Anesthesia Evaluation  Patient identified by MRN, date of birth, ID band Patient awake    Reviewed: Allergy & Precautions, NPO status , Patient's Chart, lab work & pertinent test results  History of Anesthesia Complications Negative for: history of anesthetic complications  Airway Mallampati: II  TM Distance: >3 FB Neck ROM: Full    Dental  (+) Teeth Intact   Pulmonary Current Smoker,    breath sounds clear to auscultation       Cardiovascular negative cardio ROS   Rhythm:Regular     Neuro/Psych PSYCHIATRIC DISORDERS Anxiety Depression    GI/Hepatic (+)     substance abuse  cocaine use and methamphetamine use, Hepatitis -, C  Endo/Other    Renal/GU      Musculoskeletal  (+) Arthritis , narcotic dependentLeft forearm abscess   Abdominal   Peds  Hematology   Anesthesia Other Findings   Reproductive/Obstetrics                             Anesthesia Physical Anesthesia Plan  ASA: III  Anesthesia Plan: General   Post-op Pain Management:    Induction: Intravenous  PONV Risk Score and Plan: 2 and Ondansetron and Dexamethasone  Airway Management Planned: Oral ETT  Additional Equipment: None  Intra-op Plan:   Post-operative Plan: Extubation in OR  Informed Consent: I have reviewed the patients History and Physical, chart, labs and discussed the procedure including the risks, benefits and alternatives for the proposed anesthesia with the patient or authorized representative who has indicated his/her understanding and acceptance.     Dental advisory given  Plan Discussed with: CRNA and Surgeon  Anesthesia Plan Comments:         Anesthesia Quick Evaluation

## 2019-05-02 NOTE — ED Notes (Signed)
IV team at bedside 

## 2019-05-02 NOTE — ED Notes (Signed)
Pt states no response to morphine. Pt makes intermittent moaning and cursing sounds while moving in bed

## 2019-05-02 NOTE — ED Notes (Signed)
Patient transported to X-ray 

## 2019-05-02 NOTE — ED Provider Notes (Addendum)
[place] EMERGENCY DEPARTMENT Provider Note   CSN: [id number] Arrival date & time: 05/02/19  0545    History   Chief Complaint Chief Complaint  Patient presents with  . Wound Infection    HPI [name] is a 33 y.o. female.     HPI  [name] is a 33 y.o. female, with a history of polysubstance abuse, [name], presenting to the ED with presumed infection of the left upper extremity. Patient injected heroin in the wrist yesterday and shortly thereafter began to have swelling, pain, and erythema.  Endorses subjective fever. Pain is severe, throbbing, radiating proximally.  She uses heroin, methamphetamine, and cocaine, but denies alcohol use.  Last heroin use was about 6 hours ago.  Denies nausea/vomiting, syncope, chest pain, cough, shortness of breath, numbness, weakness, or any other complaints.  Past Medical History:  Diagnosis Date  . Ankle pain, chronic   . Anxiety   . Depression   . Depression with anxiety   . Dilated bile duct 07/2017   CBD up to 11 mm on ultrasound and MRCP.  no choledocholithiasis or gallstones.    . [name] antibody positive in blood 07/2017  . Polysubstance abuse ([place])   . PTSD (post-traumatic stress disorder)     Patient Active Problem List   Diagnosis Date Noted  . Abscess, wrist 05/02/2019  . Cellulitis of left wrist 05/02/2019  . Severe opioid use disorder ([place]) 02/26/2019  . IVDU (intravenous drug user) 02/25/2019  . Septic joint ([place]) 02/24/2019  . Chronic [name] ([place]) 03/30/2013  . 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 Polysubstance dependence including opioid type drug, continuous use ([place]) 06/22/2012    Class: Chronic  . Substance induced mood disorder ([place]) 06/22/2012    Class: Chronic  . Smokes tobacco daily 01/28/2007  . Depression 01/28/2007    Past Surgical History:  Procedure Laterality Date  . ANKLE SURGERY    . APPENDECTOMY    . EUS Right 07/20/2017   Procedure: ESOPHAGEAL  ENDOSCOPIC ULTRASOUND (EUS) RADIAL;  Surgeon: Milus Banister, MD;  Location: [facility] ENDOSCOPY;  Service: Endoscopy;  Laterality: Right;  . TUBAL LIGATION     2009  . TUBAL LIGATION       OB History   No obstetric history on file.      Home Medications    Prior to Admission medications   Medication Sig Start Date End Date Taking? Authorizing Provider  buprenorphine-naloxone (SUBOXONE) 8-2 mg SUBL SL tablet Place 1 tablet under the tongue daily.   Yes [provider]    Family History Family History  Family history unknown: Yes    Social History Social History   Tobacco Use  . Smoking status: Current Every Day Smoker    Packs/day: 0.50    Years: 16.00    Pack years: 8.00    Types: Cigarettes  . Smokeless tobacco: Never Used  Substance Use Topics  . Alcohol use: No    Comment: occasionally  . Drug use: Yes    Types: Heroin, Cocaine, Methamphetamines    Comment: heroin     Allergies   Penicillins cross reactors   Review of Systems Review of Systems  Constitutional: Positive for fever.  Respiratory: Negative for cough and shortness of breath.   Cardiovascular: Negative for chest pain.  Gastrointestinal: Negative for abdominal pain, diarrhea, nausea and vomiting.  Musculoskeletal: Positive for arthralgias. Negative for neck pain.  Skin: Positive for color change.  Neurological: Negative for weakness and numbness.  All other systems reviewed and are negative.    Physical Exam Updated Vital Signs BP 131/77 (BP Location: Right Arm)   Pulse 96   Temp 99.9 F (37.7 C) (Oral)   Resp 16   SpO2 99%   Physical Exam Vitals signs and nursing note reviewed.  Constitutional:      General: She is not in acute distress.    Appearance: She is well-developed. She is not diaphoretic.  HENT:     Head: Normocephalic and atraumatic.     Mouth/Throat:     Mouth: Mucous membranes are moist.     Pharynx: Oropharynx is clear.  Eyes:     Conjunctiva/sclera:  Conjunctivae normal.  Neck:     Musculoskeletal: Neck supple.  Cardiovascular:     Rate and Rhythm: Normal rate and regular rhythm.     Pulses: Normal pulses.          Radial pulses are 2+ on the right side and 2+ on the left side.       Posterior tibial pulses are 2+ on the right side and 2+ on the left side.     Heart sounds: Normal heart sounds.     Comments: Tactile temperature in the extremities appropriate and equal bilaterally. Pulmonary:     Effort: Pulmonary effort is normal. No respiratory distress.     Breath sounds: Normal breath sounds.  Abdominal:     Palpations: Abdomen is soft.     Tenderness: There is no abdominal tenderness. There is no guarding.  Musculoskeletal:     Right lower leg: No edema.     Left lower leg: No edema.     Comments: Swelling, erythema, and exquisite tenderness to left wrist extending into the left forearm and elbow. Significant pain with range of motion of the left wrist.  Patient is able to move the elbow without noted pain or difficulty.  Full range of motion in the left hand without noted difficulty or pain.  Lymphadenopathy:     Cervical: No cervical adenopathy.  Skin:    General: Skin is warm and dry.     Capillary Refill: Capillary refill takes less than 2 seconds.  Neurological:     Mental Status: She is alert.     Comments: Sensation grossly intact to light touch through each of the nerve distributions of the bilateral upper extremities. Abduction and adduction of the fingers intact against resistance. Grip strength equal bilaterally. Supination and pronation intact against resistance. Strength 5/5 through the cardinal directions of the bilateral wrists. Strength 5/5 with flexion and extension of the bilateral elbows. Patient can touch the thumb to each one of the fingertips without difficulty.   Psychiatric:        Mood and Affect: Mood and affect normal.        Speech: Speech normal.        Behavior: Behavior normal.               ED Treatments / Results  Labs (all labs ordered are listed, but only abnormal results are displayed) Labs Reviewed  COMPREHENSIVE METABOLIC PANEL - Abnormal; Notable for the following components:      Result Value   Glucose, Bld 108 (*)    Calcium 8.8 (*)    All other components within normal limits  CBC WITH DIFFERENTIAL/PLATELET - Abnormal; Notable for the following components:   WBC 18.0 (*)    Hemoglobin 11.0 (*)    HCT 33.7 (*)    Neutro Abs 12.9 (*)  Monocytes Absolute 1.2 (*)    All other components within normal limits  RAPID URINE DRUG SCREEN, HOSP PERFORMED - Abnormal; Notable for the following components:   Opiates POSITIVE (*)    Cocaine POSITIVE (*)    Amphetamines POSITIVE (*)    All other components within normal limits  SARS CORONAVIRUS 2 (HOSPITAL ORDER, PERFORMED IN [facility])  CULTURE, BLOOD (ROUTINE X 2)  CULTURE, BLOOD (ROUTINE X 2)  LACTIC ACID, PLASMA  HIV ANTIBODY (ROUTINE TESTING W REFLEX)  I-STAT BETA HCG BLOOD, ED ([facility], [facility], [facility] ONLY)    EKG EKG Interpretation  Date/Time:  Monday May 02 2019 10:00:52 EDT Ventricular Rate:  99 PR Interval:    QRS Duration: 90 QT Interval:  360 QTC Calculation: 462 R Axis:   60 Text Interpretation:  Sinus rhythm No old tracing to compare Confirmed by Pattricia Boss [phone]) on 05/02/2019 1:18:45 PM   Radiology Dg Wrist Complete Left  Result Date: 05/02/2019 CLINICAL DATA:  Swelling and inflammation.  IV drug use. EXAM: LEFT WRIST - COMPLETE 3+ VIEW COMPARISON:  None. FINDINGS: Volar soft tissue swelling. No bone or joint abnormality. No radiopaque foreign object. IMPRESSION: Volar soft tissue swelling. Electronically Signed   By: Nelson Chimes M.D.   On: 05/02/2019 11:03   [place] Lt Upper Extrem Ltd Soft Tissue Non Vascular  Result Date: 05/02/2019 CLINICAL DATA:  Distal forearm cellulitis. EXAM: ULTRASOUND LEFT UPPER EXTREMITY LIMITED TECHNIQUE: Ultrasound examination of the upper extremity  soft tissues was performed in the area of clinical concern. COMPARISON:  None FINDINGS: Real-time sonography of the left forearm was performed. Severe soft tissue swelling along the anterior aspect of the distal forearm. Complex fluid collection measuring 3.9 x 1.6 x 3 cm concerning for an abscess. IMPRESSION: 1. Cellulitis of the distal forearm with a 3.9 x 1.6 x 3 cm abscess in the distal anterior forearm. Electronically Signed   By: Kathreen Devoid   On: 05/02/2019 11:23    Procedures .Critical Care Performed by: Lorayne Bender, PA-C Authorized by: Lorayne Bender, PA-C   Critical care provider statement:    Critical care time (minutes):  35   Critical care time was exclusive of:  Separately billable procedures and treating other patients   Critical care was necessary to treat or prevent imminent or life-threatening deterioration of the following conditions:  Sepsis   Critical care was time spent personally by me on the following activities:  Development of treatment plan with patient or surrogate, discussions with consultants, evaluation of patient's response to treatment, examination of patient, obtaining history from patient or surrogate, ordering and performing treatments and interventions, ordering and review of laboratory studies, ordering and review of radiographic studies, pulse oximetry, re-evaluation of patient's condition and review of old charts   I assumed direction of critical care for this patient from another provider in my specialty: no     (including critical care time)  Medications Ordered in ED Medications  sodium chloride flush (NS) 0.9 % injection 3 mL (3 mLs Intravenous Not Given 05/02/19 1237)  vancomycin (VANCOCIN) 1,500 mg in sodium chloride 0.9 % 500 mL IVPB (1,500 mg Intravenous New Bag/Given 05/02/19 1126)  vancomycin (VANCOCIN) IVPB 1000 mg/200 mL premix (has no administration in time range)  ondansetron (ZOFRAN) injection 4 mg (4 mg Intravenous Not Given 05/02/19 1238)   acetaminophen (TYLENOL) tablet 650 mg (has no administration in time range)    Or  acetaminophen (TYLENOL) suppository 650 mg (has no administration in time  range)  senna (SENOKOT) tablet 8.6 mg (has no administration in time range)  ondansetron (ZOFRAN) tablet 4 mg (has no administration in time range)    Or  ondansetron (ZOFRAN) injection 4 mg (has no administration in time range)  HYDROmorphone (DILAUDID) injection 1 mg (has no administration in time range)  levofloxacin (LEVAQUIN) IVPB 750 mg (750 mg Intravenous New Bag/Given 05/02/19 1120)  sodium chloride 0.9 % bolus 1,000 mL (1,000 mLs Intravenous New Bag/Given 05/02/19 1112)  morphine 4 MG/ML injection 4 mg (4 mg Intravenous Given 05/02/19 1112)     Initial Impression / Assessment and Plan / ED Course  I have reviewed the triage vital signs and the nursing notes.  Pertinent labs & imaging results that were available during my care of the patient were reviewed by me and considered in my medical decision making (see chart for details).  Clinical Course as of May 02 1319  Mon May 02, 2019  0954 Spoke with Silvestre Gunner, PA on call for hand surgery. He will come see the patient.   [SJ]  52 Spoke with Dr. Berline Lopes, IM resident. States his team will be by to evaluate patient for admission.   [SJ]    Clinical Course User Index [SJ] Nare Gaspari C, PA-C       Patient presents with pain and swelling in the left wrist in the setting of IV drug abuse. Arrived tachycardic, subjective fever, and leukocytosis.  Code sepsis initiated.  No noted hypotension or lactic acidosis.  There is evidence of cellulitis and abscess on ultrasound. Hand surgery consult with expectation for patient to go to surgery this afternoon.  Admitted via internal medicine service.  Findings and plan of care discussed with Pattricia Boss, MD. Dr. Jeanell Sparrow personally evaluated and examined this patient.   Final Clinical Impressions(s) / ED Diagnoses   Final diagnoses:   Cellulitis of left upper extremity  Abscess of wrist    ED Discharge Orders    None       Layla Maw 05/02/19 1320    Pattricia Boss, MD 05/03/19 [place], Helane Gunther, PA-C 05/22/19 [id number]    Pattricia Boss, MD 05/30/19 1433

## 2019-05-02 NOTE — ED Notes (Signed)
Pt has again removed bp cuff. Pt continues with almost continuous movment about in bed.

## 2019-05-02 NOTE — Anesthesia Postprocedure Evaluation (Signed)
Anesthesia Post Note  Patient: [name]  Procedure(s) Performed: IRRIGATION AND DEBRIDEMENT LEFT FOREARM (Left Arm Lower)     Patient location during evaluation: PACU Anesthesia Type: General Level of consciousness: awake Pain management: pain level controlled Vital Signs Assessment: post-procedure vital signs reviewed and stable Respiratory status: spontaneous breathing Cardiovascular status: stable Postop Assessment: no apparent nausea or vomiting Anesthetic complications: no    Last Vitals:  Vitals:   05/02/19 1740 05/02/19 1750  BP:  (!) 125/58  Pulse:  80  Resp:  16  Temp: 36.6 C 36.8 C  SpO2:  99%    Last Pain:  Vitals:   05/02/19 1806  TempSrc:   PainSc: 3                  Cale Bethard

## 2019-05-02 NOTE — ED Notes (Signed)
Pt denies nausea and ask for food. Explained she must be NPO

## 2019-05-02 NOTE — Progress Notes (Signed)
Pharmacy Antibiotic Note  [name] is a 33 y.o. female admitted on 05/02/2019 with wound infection.  Patient injected heroin into the wrist on 05/01/19.  Pharmacy has been consulted for vancomycin dosing for sepsis/cellulitis.  SCr 0.92, CrCL 95 ml/min, Afebrile, WBC 18, LA 1.  Plan: Vanc 1500mg  IV x 1, then 1g IV Q12H for AUC 467 using SCr 0.92 Monitor renal fxn, clinical progress and vanc AUC as indicated   Weight: 169 lb 15.6 oz (77.1 kg)  Temp (24hrs), Avg:99.9 F (37.7 C), Min:99.9 F (37.7 C), Max:99.9 F (37.7 C)  Recent Labs  Lab 05/02/19 0559  WBC 18.0*  CREATININE 0.92  LATICACIDVEN 1.0    Estimated Creatinine Clearance: 95 mL/min (by C-G formula based on SCr of 0.92 mg/dL).    Allergies  Allergen Reactions  . Penicillins Cross Reactors Hives    Has patient had a PCN reaction causing immediate rash, facial/tongue/throat swelling, SOB or lightheadedness with hypotension: Yes Has patient had a PCN reaction causing severe rash involving mucus membranes or skin necrosis: Yes Has patient had a PCN reaction that required hospitalization unknown Has patient had a PCN reaction occurring within the last 10 years: unknown If all of the above answers are "NO", then may proceed with Cephalosporin use. No issues with cephalosporins     Vanc 6/1 >>  6/1 covid -  6/1 BCx -  6/1 MRSA PCR -   Reginald Mangels D. Mina Marble, PharmD, BCPS, [place] 05/02/2019, 10:02 AM

## 2019-05-02 NOTE — H&P (Addendum)
Date: 05/02/2019               Patient Name:  [name] MRN: [id number]  DOB: [birth year] Age / Sex: 33 y.o., female   PCP: Patient, No Pcp Per         Medical Service: Internal Medicine Teaching Service         Attending Physician: Dr. Rebeca Alert, Raynaldo Opitz, MD    First Contact: Dr. Gilberto Better Pager: [phone]  Second Contact: Dr. Kathi Ludwig Pager: [phone]       After Hours (After 5p/  First Contact Pager: [phone]  weekends / holidays): Second Contact Pager: [phone]   Chief Complaint: Wrist pain  History of Present Illness:  [name] is a 33 yo F w/ PMH of Hep C exposure, polysubstance disorder and unstable housing presenting with significant left wrist pain. She was observed tremulous, crying out in pain and tearful. She states she was in her usual state of health until yesterday night when she began to endorse significant pain of her left wrist which was one of her sites for IV drug use. She states she was doing well on suboxone until about a month ago when she ran out of her supply and started exhibiting withdrawal symptoms like rhinorrhea and chills which prompted to her to seek out illicit substance. She states she tried to treat it herself with heating pads but it seemed to make the pain worse. This morning she woke up and her pain had significantly worsened and was intolerable so she came to the ED for evaluation.  On review of systems, she denies any fever, chills, chest pain, palpations, dyspnea, nausea, vomiting, abdominal pain, diarrhea. She denies any weakness or numbness.  On chart review, she had a recent admission for septic arthritis of the spine in 01/2019 for which she was discharged with 6 week course of Levaquin and Doxy. She mentions that she was taking her antibiotics as prescribed for about 3-4 weeks but then lost her housing and her supply of antibiotics. She had a follow up appointment at [facility] clinic where she was recommended to go to ED for evaluation  but was lost to follow up.  Meds:  Current Meds  Medication Sig   buprenorphine-naloxone (SUBOXONE) 8-2 mg SUBL SL tablet Place 1 tablet under the tongue daily.   [DISCONTINUED] buprenorphine (SUBUTEX) 8 MG SUBL SL tablet Place 8 mg under the tongue daily.   Allergies: Allergies as of 05/02/2019 - Review Complete 05/02/2019  Allergen Reaction Noted   Penicillins cross reactors Hives 12/15/2011   Past Medical History:  Diagnosis Date   Ankle pain, chronic    Anxiety    Depression    Depression with anxiety    Dilated bile duct 07/2017   CBD up to 11 mm on ultrasound and MRCP.  no choledocholithiasis or gallstones.     [name] antibody positive in blood 07/2017   Polysubstance abuse ([place])    PTSD (post-traumatic stress disorder)    Family History:  Unable to provide  Social History: She is currently homeless and has minimal social support. Currently smokes half pack daily. Occasional alcohol use. Used to be on suboxone until a week ago. Used crack-cocaine, methamphetamine and heroin earlier this morning.  Review of Systems: A complete ROS was negative except as per HPI.  Physical Exam: Blood pressure (!) 184/155, pulse 93, temperature 99.9 F (37.7 C), temperature source Oral, resp. rate 18, weight 77.1 kg, SpO2 99 %. Physical Exam  Constitutional: She is oriented to person, place, and time. She appears distressed (Tearful, writhing in pain, having clonic contractions).  HENT:  Mouth/Throat: Oropharynx is clear and moist.  Eyes: Conjunctivae are normal.  Neck: Normal range of motion. Neck supple.  Cardiovascular: Regular rhythm, normal heart sounds and intact distal pulses.  No murmur heard. Tachycardic  Pulmonary/Chest: Effort normal and breath sounds normal. She has no wheezes. She has no rales.  Abdominal: Soft. Bowel sounds are normal. She exhibits no distension. There is no abdominal tenderness.  Musculoskeletal: Normal range of motion.         General: No edema.  Neurological: She is alert and oriented to person, place, and time.  Skin: Skin is warm and dry. There is erythema (Left forearm with erythema, edema and exquisite tenderness to palpation pictured below).  Multiple track marks  Psychiatric:  Tearful   EKG: personally reviewed my interpretation is sinus tachycardia, normal axis, wandering leads but no overt ST changes. No significant changes from prior EKG  ULTRASOUND LEFT UPPER EXTREMITY LIMITED FINDINGS: Real-time sonography of the left forearm was performed. Severe soft tissue swelling along the anterior aspect of the distal forearm. Complex fluid collection measuring 3.9 x 1.6 x 3 cm concerning for an abscess.  IMPRESSION: 1. Cellulitis of the distal forearm with a 3.9 x 1.6 x 3 cm abscess in the distal anterior forearm.  Assessment & Plan by Problem:   Active Problems:   Abscess, wrist   Cellulitis of left wrist  [name] is a 33 yo F w/ PMH of unstable housing, polysubstance use and hx of lumbar spine septic arthritis presenting to ED with wrist pain 2/2 forearm cellulitis with abscess. On ultrasound found to have abscess but no osteomyelitis. Ortho planning on taking her to OR for I&D later today. She has known hx of septic arthritis 2/2 IVDU with inability to complete full abx course due to social barriers. She will need follow up MRI once this acute issue has resolved. Vanc was started by ED provider. Will add ceftriaxone for better MSSA/Strep coverage. Prior blood cultures have been negative but she is high risk for staph bacteremia. If her cultures are positive, she will also need echocardiogram to rule out endocarditis.  Forearm pain and swelling 2/2 cellulitis w/ abscess Hx of polysubstance abuse including IV drug use. Tender, erythematous, warm left forearm. WBC 18. Afebrile. BP 132/76. Ultrasound showing 4x1.6x3cm abscess. OR this pm for I&D. - C/w vancomycin IV - Start ceftriaxone 2g daily - F/u  blood cultures, wound cultures - Will need TTE/TEE if blood culture positive - Dilaudid 1mg  2hr PRN for pain  Hx of septic L5-S1 facet arthritis Discharged w/ 6 week course of doxy, levo on 02/26/19. Cultures negative during that admission. Did not finish course of abx per patient - MRI lumbar spine tomorrow - F/u blood cultures  Hx of polysubstances abuse Pt mentions hx of heroin, crack and meth. UDS + for opiates, cocaine, amphetamines. Was practicing sobriety with suboxone until recently. - Monitor for withdrawal symptoms - Can resume Suboxone once acute issue has resolved  Hx of [name] Hep C antibody + noted during last admission w/ negative RNA - F/u with outpatient ID  DVT prophx: SCD Diet: NpO til after surg Bowel: Senokot Code: Full  Dispo: Admit patient to Inpatient with expected length of stay greater than 2 midnights.  Signed: [name], MD 05/02/2019, 1:58 PM  Pager: [phone]

## 2019-05-02 NOTE — ED Notes (Signed)
Got patient completely undress for surgery

## 2019-05-02 NOTE — ED Notes (Addendum)
Pt refuses repeat bp or pulse ox. Dr. Jeanell Sparrow aware.

## 2019-05-03 ENCOUNTER — Encounter (HOSPITAL_COMMUNITY): Payer: Self-pay | Admitting: General Surgery

## 2019-05-03 ENCOUNTER — Inpatient Hospital Stay (HOSPITAL_COMMUNITY): Payer: Self-pay

## 2019-05-03 DIAGNOSIS — F1990 Other psychoactive substance use, unspecified, uncomplicated: Secondary | ICD-10-CM

## 2019-05-03 DIAGNOSIS — Z8619 Personal history of other infectious and parasitic diseases: Secondary | ICD-10-CM

## 2019-05-03 LAB — CBC
HCT: 35.3 % — ABNORMAL LOW (ref 36.0–46.0)
Hemoglobin: 11.8 g/dL — ABNORMAL LOW (ref 12.0–15.0)
MCH: 28 pg (ref 26.0–34.0)
MCHC: 33.4 g/dL (ref 30.0–36.0)
MCV: 83.8 fL (ref 80.0–100.0)
Platelets: 370 10*3/uL (ref 150–400)
RBC: 4.21 MIL/uL (ref 3.87–5.11)
RDW: 12.9 % (ref 11.5–15.5)
WBC: 13.2 10*3/uL — ABNORMAL HIGH (ref 4.0–10.5)
nRBC: 0 % (ref 0.0–0.2)

## 2019-05-03 LAB — BASIC METABOLIC PANEL
Anion gap: 9 (ref 5–15)
BUN: 9 mg/dL (ref 6–20)
CO2: 24 mmol/L (ref 22–32)
Calcium: 8.7 mg/dL — ABNORMAL LOW (ref 8.9–10.3)
Chloride: 105 mmol/L (ref 98–111)
Creatinine, Ser: 0.75 mg/dL (ref 0.44–1.00)
GFR calc Af Amer: >60 mL/min (ref >60)
GFR calc non Af Amer: >60 mL/min (ref >60)
Glucose, Bld: 160 mg/dL — ABNORMAL HIGH (ref 70–99)
Potassium: 3.9 mmol/L (ref 3.5–5.1)
Sodium: 138 mmol/L (ref 135–145)

## 2019-05-03 LAB — HIV ANTIBODY (ROUTINE TESTING W REFLEX): HIV Screen 4th Generation wRfx: NONREACTIVE

## 2019-05-03 MED ORDER — SODIUM CHLORIDE 0.9 % IV SOLN
INTRAVENOUS | Status: DC | PRN
  Administered 2019-05-03: 250 mL via INTRAVENOUS

## 2019-05-03 MED ORDER — GADOBUTROL 1 MMOL/ML IV SOLN
10.0000 mL | Freq: Once | INTRAVENOUS | Status: AC | PRN
  Administered 2019-05-03: 10 mL via INTRAVENOUS

## 2019-05-03 NOTE — Progress Notes (Signed)
S: pt offers no complaints.  O:Blood pressure (!) 109/44, pulse (!) 59, temperature (!) 97.3 F (36.3 C), temperature source Oral, resp. rate 18, height 5' 7.99" (1.727 m), weight 90 kg, SpO2 97 %.  L Hand:  Dressing changed, packing removed, no gross purulence, swelling decreased Cx's pending  A:s/p I&D L wrist/forearm abscess   P:start TID dressing changes, cont abx / f/u cultures, SW for ? Dc tomorrow, will ?need supplies for wound irrigation, dressing changes.

## 2019-05-03 NOTE — Progress Notes (Signed)
Subjective:  [name] is a 33 y.o. with PMH of unstable housing, polysubstance use and lumbar septic arthritis admit for forearm abscess on hospital day 1  [name] was examined at bedside this morning and reports that her arm pain has significantly improved. She denies fevers, chills, chest pain. Her only complaint now is post-op pain. She has good bowel movement. We explained to her that we will need an MRI of her spine as well. She required if it will be possible to re-start her suboxone. Had discussion about returning to [facility] clinic to re-establish care. [name] expressed understanding.  Objective:  Vital signs in last 24 hours: Vitals:   05/02/19 1740 05/02/19 1750 05/02/19 2122 05/03/19 0455  BP:  (!) 125/58 129/73 (!) 128/52  Pulse:  80 82 69  Resp:  16 18 18   Temp: 97.9 F (36.6 C) 98.3 F (36.8 C) 98.4 F (36.9 C) (!) 97.5 F (36.4 C)  TempSrc:  Oral Oral Oral  SpO2:  99% 97% 100%  Weight:   90 kg   Height:       Gen: Well-developed, well nourished, NAD HEENT: EOMI, No nasal discharge, MMM Neck: supple, ROM intact CV: RRR, S1, S2 normal, No rubs, no murmurs, no gallops Pulm: CTAB, No rales, no wheezes Abd: Soft, BS+, NTND, No rebound, no guarding Extm: Mild tenderness to palpation of lumbar spine near L5. ROM intact, Peripheral pulses intact, No peripheral edema Skin: Dry, Warm, normal turgor, left forearm covered with fresh bandaging did not remove to examine. Good cap refill of fingertips. Neuro: AAOx3, Sensation of left finger tips intact. 4+ strength on grip. Psych: Tearful while discussing resuming suboxone  Assessment/Plan:  Active Problems:   Abscess of wrist   Cellulitis of left upper extremity  [name] is a 33 yo F w/ PMH of unstable housing, polysubstance use and hx of lumbar spine septic arthritis presenting to ED with wrist pain 2/2 forearm cellulitis with abscess. I&D performed by hand surgery yesterday. Endorsing some weakness but significant  improve in pain. No obvious signs of systemic illness. Leukocytosis improving, Afebrile. Blood culture without growth. Wound culture showing gram + cocci and gram - rods currently covered by empiric antibiotics. Will f/u cultures before switching to PO meds.  Forearm pain and swelling 2/2 cellulitis w/ abscess Hx of polysubstance abuse including IV drug use. Left forearm pain significantly improved. No loss of sensation. WBC 18->13.2. Afebrile. BP 128/52. Hand surgery operative note showed purulent drainage limited to superficial compartment without deeper penetration. Gram stain of wound culture shows gram + cocci, gram - rods. - Appreciate hand surgery recs - F/u wound culture/blood cultures - C/w vancomycin IV, ceftriaxone - F/u blood cultures, wound cultures - Will need TTE/TEE if blood culture positive - Scheduled ibuprofen and norco prn for pain  Hx of septic L5-S1 facet arthritis Discharged w/ 6 week course of doxy, levo on 02/26/19. Cultures negative during that admission. Did not finish course of abx per patient - MRI lumbar spine today - F/u blood cultures  Hx of polysubstances abuse hx of heroin, crack and meth. Would like to resume suboxone treatment - Monitor for withdrawal symptoms - Can resume Suboxone once acute issue has resolved  Hx of [name] Hep C antibody + noted during last admission w/ negative RNA - F/u with outpatient ID  DVT prophx: SCDs Diet: Regular Bowel: Senokot, Miralax Code: Full  Dispo: Anticipated discharge in approximately 2-3 day(s).   [name], MD 05/03/2019, 6:37 AM  Pager: [phone]

## 2019-05-03 NOTE — Progress Notes (Signed)
Patient refused dressing change to Left hand. Dressing to be changed TID. Will attempt in the am. Mingo Tamber, RN

## 2019-05-04 LAB — CBC
HCT: 32.8 % — ABNORMAL LOW (ref 36.0–46.0)
Hemoglobin: 10.9 g/dL — ABNORMAL LOW (ref 12.0–15.0)
MCH: 28.2 pg (ref 26.0–34.0)
MCHC: 33.2 g/dL (ref 30.0–36.0)
MCV: 84.8 fL (ref 80.0–100.0)
Platelets: 391 10*3/uL (ref 150–400)
RBC: 3.87 MIL/uL (ref 3.87–5.11)
RDW: 13.2 % (ref 11.5–15.5)
WBC: 8.6 10*3/uL (ref 4.0–10.5)
nRBC: 0 % (ref 0.0–0.2)

## 2019-05-04 LAB — BASIC METABOLIC PANEL
Anion gap: 10 (ref 5–15)
BUN: <5 mg/dL — ABNORMAL LOW (ref 6–20)
CO2: 24 mmol/L (ref 22–32)
Calcium: 8.4 mg/dL — ABNORMAL LOW (ref 8.9–10.3)
Chloride: 107 mmol/L (ref 98–111)
Creatinine, Ser: 0.73 mg/dL (ref 0.44–1.00)
GFR calc Af Amer: >60 mL/min (ref >60)
GFR calc non Af Amer: >60 mL/min (ref >60)
Glucose, Bld: 96 mg/dL (ref 70–99)
Potassium: 3.4 mmol/L — ABNORMAL LOW (ref 3.5–5.1)
Sodium: 141 mmol/L (ref 135–145)

## 2019-05-04 LAB — C-REACTIVE PROTEIN: CRP: 5.8 mg/dL — ABNORMAL HIGH (ref <1.0)

## 2019-05-04 LAB — MAGNESIUM: Magnesium: 2 mg/dL (ref 1.7–2.4)

## 2019-05-04 LAB — SEDIMENTATION RATE: Sed Rate: 40 mm/hr — ABNORMAL HIGH (ref 0–22)

## 2019-05-04 MED ORDER — NICOTINE 14 MG/24HR TD PT24
14.0000 mg | MEDICATED_PATCH | Freq: Every day | TRANSDERMAL | Status: DC
  Administered 2019-05-04 – 2019-05-05 (×2): 14 mg via TRANSDERMAL
  Filled 2019-05-04 (×2): qty 1

## 2019-05-04 MED ORDER — ENOXAPARIN SODIUM 40 MG/0.4ML ~~LOC~~ SOLN
40.0000 mg | SUBCUTANEOUS | Status: DC
  Administered 2019-05-04: 40 mg via SUBCUTANEOUS
  Filled 2019-05-04: qty 0.4

## 2019-05-04 MED ORDER — POTASSIUM CHLORIDE CRYS ER 20 MEQ PO TBCR
40.0000 meq | EXTENDED_RELEASE_TABLET | Freq: Once | ORAL | Status: AC
  Administered 2019-05-04: 40 meq via ORAL
  Filled 2019-05-04: qty 2

## 2019-05-04 MED ORDER — BUPRENORPHINE HCL-NALOXONE HCL 2-0.5 MG SL SUBL
1.0000 | SUBLINGUAL_TABLET | SUBLINGUAL | Status: AC | PRN
  Administered 2019-05-04 (×2): 1 via SUBLINGUAL
  Filled 2019-05-04 (×2): qty 1

## 2019-05-04 MED ORDER — BUPRENORPHINE HCL-NALOXONE HCL 8-2 MG SL SUBL: 1.0000 | SUBLINGUAL_TABLET | Freq: Two times a day (BID) | SUBLINGUAL | Status: DC

## 2019-05-04 NOTE — Discharge Instructions (Signed)
[facility] Solution to the Opioid Problem (GCSTOP) Fixed; mobile; peer-based Peri Maris [phone] [email or web address] Fixed site exchange at [facility], [address]. [place], [place] [address] on Wednesdays (2:00 - 5:00 pm) and Thursdays (4:00 - 8:00 pm). Pop-up mobile exchange locations: [employer], [place], [place] [address] on Tuesdays (11:00 am - 1:00 pm) and Fridays (11:00 am - 1:00 pm) [place], [address], [place], [place] [address] on Tuesdays (2:00 - 4:00 pm) and Fridays (2:00 - 4:00 pm)  [facility]), [address] [place], [place] [address] on Thursdays (11:00 am - 3:00 pm)  Needle exchange information: [facility] - also serves Public relations account executive and [employer] Fixed; mobile; peer-based Rosemary Holms [phone] [email or web address] [address], [place], [place] [address] Delivery and outreach available in [place] and [place], please call for more information. [employer] ([facility] outreach in [place] and [place]) Knina Strichariz [phone]  [email or web address] Exchange available 2:00 - 8:00 pm (except Wednesdays). Please call or text for more information.

## 2019-05-04 NOTE — Progress Notes (Signed)
Subjective:  [name] is a 33 y.o. F with PMH of unstable housing, polysubastance use, and lumbar septic arthritis admit for forearm abscess on hospital day 2  [name] was examined at bedside today and reports that she feels tired. She endorses chest pain in the middle of the night (right sided) which spontaneously resolved. Also had episode of nausea and dry heaving overnight but no vomiting or diabetes. She was made aware of her MRI results and has agreed to following up with the [facility] clinic after discharge.   Objective:  Vital signs in last 24 hours: Vitals:   05/03/19 1806 05/03/19 2044 05/04/19 0220 05/04/19 0528  BP: 136/61 130/61 (!) 148/97 135/70  Pulse: 66 70 77 70  Resp: '18 19  18  '$ Temp: 98 F (36.7 C) 98.1 F (36.7 C)  98.2 F (36.8 C)  TempSrc: Oral Oral  Oral  SpO2: 100% 99% 99% 98%  Weight:      Height:       Gen: Well-developed, well nourished, Sleepy HEENT: Slightly larger pupils, equil and reactive to light, No nasal discharge, MMM Neck: supple, ROM intact CV: RRR, S1, S2 normal, No rubs, no murmurs, no gallops Pulm: CTAB, No rales, no wheezes Abd: Soft, BS+, NTND, No rebound, no guarding Extm: ROM intact, Peripheral pulses intact, No peripheral edema Skin: + diaphoresis, Warm, normal turgor, surgical site covered in fresh bandaging Neuro: AAOx3, Not tremulous Psych: Normal mood and affect  Assessment/Plan:  Principal Problem:   Abscess of wrist Active Problems:   Polysubstance dependence including opioid type drug, continuous use (HCC)   Cellulitis of left upper extremity  [name] is a 33 yo F w/ PMH of unstable housing, polysubstance use and hx of lumbar spine septic arthritis presenting to ED with wrist pain 2/2 forearm cellulitis with abscess.Her leukocytosis has resolved and she is afebrile with stable vitals on current abx regimen. Wound culture without speciation at the moment. MRI lumbar spine shows evidence of osteomyelitis which will  require prolonged antibiotic therapy. Will await results of wound and blood cultures before switching to oral antibiotics. She is amenable to restarting suboxone treatment for her opioid use disorder. Will start today.  Forearm pain and swelling 2/2 cellulitis w/ abscess Hx of polysubstance abuse including IV drug use. Left forearm pain significantly improved. No loss of sensation.WBC 18->13.2->8.6 Afebrile. BP 135/70.Dressing changes per hand surgery. Gram stain of wound culture shows gram + cocci, gram - rods. Culture re-incubated for better growth. Blood culture NGTD - Appreciate hand surgery recs - F/u wound culture/blood cultures - C/w vancomycin IV, ceftriaxone - Will need TTE/TEE if blood culture positive - Scheduled ibuprofen for pain  Hx of septic L5-S1 facet arthritis Discharged w/ 6 week course of doxy, levo on 02/26/19. MRI spine: L5-S1 osteomyelitis. ID curbside states may be able to go shorter duration of treatment if inflammatory markers are low as imaging lags behind and may not accurately reflect ongoing infection - ESR, CRP - F/u blood cultures - c/w antibiotic therapy as above  Hx of polysubstances abuse hx of heroin, crack and meth. Would like to resume suboxone treatment - Monitor for withdrawal symptoms - Suboxone protocol start today (COWS q4hr, Suboxone '2mg'$  now and up-titrate depending on COWS score overnight to '8mg'$  tomorrow) - Will need outpatient script and follow up appointment at discharge  Hx of [name] Hep C antibody + noted during last admission w/ negative RNA - F/u with outpatient ID  DVT prophx: Lovenox Diet: Regular  Bowel: Senokot Code: Full  Dispo: Anticipated discharge in approximately 0-1 day(s).   Anne Anis, MD 05/04/2019, 6:26 AM Pager: [phone]

## 2019-05-05 DIAGNOSIS — F1910 Other psychoactive substance abuse, uncomplicated: Secondary | ICD-10-CM

## 2019-05-05 DIAGNOSIS — M4627 Osteomyelitis of vertebra, lumbosacral region: Secondary | ICD-10-CM

## 2019-05-05 DIAGNOSIS — Z5329 Procedure and treatment not carried out because of patient's decision for other reasons: Secondary | ICD-10-CM

## 2019-05-05 LAB — CBC
HCT: 38.1 % (ref 36.0–46.0)
Hemoglobin: 12.7 g/dL (ref 12.0–15.0)
MCH: 27.8 pg (ref 26.0–34.0)
MCHC: 33.3 g/dL (ref 30.0–36.0)
MCV: 83.4 fL (ref 80.0–100.0)
Platelets: 473 10*3/uL — ABNORMAL HIGH (ref 150–400)
RBC: 4.57 MIL/uL (ref 3.87–5.11)
RDW: 12.9 % (ref 11.5–15.5)
WBC: 7.8 10*3/uL (ref 4.0–10.5)
nRBC: 0 % (ref 0.0–0.2)

## 2019-05-05 LAB — BASIC METABOLIC PANEL
Anion gap: 12 (ref 5–15)
BUN: <5 mg/dL — ABNORMAL LOW (ref 6–20)
CO2: 22 mmol/L (ref 22–32)
Calcium: 8.9 mg/dL (ref 8.9–10.3)
Chloride: 107 mmol/L (ref 98–111)
Creatinine, Ser: 0.94 mg/dL (ref 0.44–1.00)
GFR calc Af Amer: >60 mL/min (ref >60)
GFR calc non Af Amer: >60 mL/min (ref >60)
Glucose, Bld: 88 mg/dL (ref 70–99)
Potassium: 3.6 mmol/L (ref 3.5–5.1)
Sodium: 141 mmol/L (ref 135–145)

## 2019-05-05 MED ORDER — DOXYCYCLINE HYCLATE 100 MG PO TABS: 100.0000 mg | ORAL_TABLET | Freq: Two times a day (BID) | ORAL | 0 refills | Status: DC

## 2019-05-05 MED ORDER — VANCOMYCIN HCL IN DEXTROSE 750-5 MG/150ML-% IV SOLN
750.0000 mg | Freq: Two times a day (BID) | INTRAVENOUS | Status: DC
  Administered 2019-05-05: 750 mg via INTRAVENOUS
  Filled 2019-05-05: qty 150

## 2019-05-05 MED ORDER — LEVOFLOXACIN 750 MG PO TABS: 750.0000 mg | ORAL_TABLET | Freq: Every day | ORAL | 0 refills | Status: DC

## 2019-05-05 MED ORDER — BUPRENORPHINE HCL-NALOXONE HCL 8-2 MG SL SUBL
1.0000 | SUBLINGUAL_TABLET | Freq: Two times a day (BID) | SUBLINGUAL | Status: DC
  Administered 2019-05-05: 1 via SUBLINGUAL
  Filled 2019-05-05: qty 1

## 2019-05-05 MED ORDER — BUPRENORPHINE HCL-NALOXONE HCL 8-2 MG SL SUBL: 1.0000 | SUBLINGUAL_TABLET | Freq: Two times a day (BID) | SUBLINGUAL | 0 refills | Status: DC

## 2019-05-05 MED FILL — BUPRENORPHIN-NALOXON 8-2 MG: 8-2 | 7 days supply | Qty: 14 | Fill #0

## 2019-05-05 MED FILL — levoFLOXacin 750 MG TABS: 750 | 45 days supply | Qty: 45 | Fill #0

## 2019-05-05 NOTE — Progress Notes (Signed)
Patient refused having her Left wrist dressing changed at this time.   Farley Ly RN

## 2019-05-05 NOTE — Discharge Summary (Signed)
Name: [name] MRN: [id number] DOB: 07-14-1986 33 y.o. PCP: Patient, No Pcp Per  Date of Admission: 05/02/2019  5:49 AM Date of Discharge: 05/05/2019 1:10 PM Attending Physician: Oda Kilts, MD  Discharge Diagnosis: 1. L Forearm cellulitis with abscess 2. L5-S1 facet joint osteomyelitis 3. Polysubstance abuse  Discharge Medications: Allergies as of 05/05/2019      Reactions   Penicillins Cross Reactors Hives   Has patient had a PCN reaction causing immediate rash, facial/tongue/throat swelling, SOB or lightheadedness with hypotension: Yes Has patient had a PCN reaction causing severe rash involving mucus membranes or skin necrosis: Yes Has patient had a PCN reaction that required hospitalization unknown Has patient had a PCN reaction occurring within the last 10 years: unknown If all of the above answers are "NO", then may proceed with Cephalosporin use. No issues with cephalosporins      Medication List    TAKE these medications   buprenorphine-naloxone 8-2 mg Subl SL tablet Commonly known as:  SUBOXONE Place 1 tablet under the tongue daily. What changed:  Another medication with the same name was added. Make sure you understand how and when to take each.   buprenorphine-naloxone 8-2 mg Subl SL tablet Commonly known as:  SUBOXONE Place 1 tablet under the tongue 2 (two) times daily. What changed:  You were already taking a medication with the same name, and this prescription was added. Make sure you understand how and when to take each.   doxycycline 100 MG tablet Commonly known as:  VIBRA-TABS Take 1 tablet (100 mg total) by mouth 2 (two) times daily.   levofloxacin 750 MG tablet Commonly known as:  Levaquin Take 1 tablet (750 mg total) by mouth daily.       Disposition and follow-up:   [name] was discharged from [facility] in [place] condition.  At the hospital follow up visit please address:  1. L Forearm cellulitis with abscess:  - Left AGAINST MEDICAL ADVICE without finishing infectious work-up - Please ensure she picked up and finished her antibiotics course (doxy, levaquin for 6 weeks)  2. L5-S1 facet joint osteomyelitis - See above  3. Polysubstance abuse - Please ensure she follows up with suboxone clinic at [facility]  2.  Labs / imaging needed at time of follow-up: N/A  3.  Pending labs/ test needing follow-up: Blood culture  Follow-up Appointments: Follow-up Information    [place]. Schedule an appointment as soon as possible for a visit in 1 week(s).   Contact information: [place] [id number] [place] Hospital Course by problem list: 1. L Forearm cellulitis with abscess: [name] is a 33 yo F w/ PMH of polysubstance use disorder presenting with left forearm pain and swelling. She was found to have leukocytosis with WBC of 18 and tender forearm with erythema. X-ray of the wrist showed no evidence of osteomyelitis. Ultrasound of the arm showed evidence of 4x1.6x3cm abscess. She was started on vancomycin and ceftriaxone. Hand surgery was consulted. She was taken to OR on the day of admission for I&D. She tolerated the procedure well. While awaiting speciation and sensitivity of wound culture prior to starting oral antibiotics, she signed out [place]. Wound culture grew streptococcus constellatus.   2. L5-S1 osteomyelitis: Had recent hx of L5-S1 facet septic arthritis with recommendation for levaquin and doxy for 6 weeks. On admission informed us she did not  complete her treatment course. Repeat MRI of the lumbar spine showed evidence of osteomyelitis. Left AMA before blood cultures resulted.  3. Polysubstance abuse: Hx of meth, heroin, and crack cocaine use. UDS+ for all 3 on admission. She expressed interest in sobriety and suboxone started during admission. Left AMA prior to being set up with outpatient suboxone  clinic.  Discharge Vitals:   BP (!) 172/90 (BP Location: Right Leg)   Pulse 74   Temp 98.2 F (36.8 C) (Oral)   Resp 18   Ht 5' 7.99" (1.727 m)   Wt 89.2 kg   SpO2 100%   BMI 29.91 kg/m   Pertinent Labs, Studies, and Procedures:  Specimen Description ABSCESS   Special Requests LEFT FOREARM SAMPLE A   Gram Stain ABUNDANT WBC PRESENT, PREDOMINANTLY PMN  ABUNDANT GRAM POSITIVE COCCI  ABUNDANT GRAM NEGATIVE RODS   Culture FEW STREPTOCOCCUS CONSTELLATUS  NO ANAEROBES ISOLATED; CULTURE IN PROGRESS FOR 5 DAYS  WITHIN MIXED ORGANISMS  Performed at [facility], [place] [address] [place], [place] [address]   Report Status PENDING   Organism ID, Bacteria STREPTOCOCCUS CONSTELLATUS   Resulting Agency [facility]  Susceptibility    Streptococcus constellatus    MIC    CEFTRIAXONE 1 SENSITIVE  Sensitive    ERYTHROMYCIN >=8 RESISTANT  Resistant    LEVOFLOXACIN 1 SENSITIVE  Sensitive    PENICILLIN 0.25 INTERM... Intermediate    VANCOMYCIN 1 SENSITIVE  Sensitive      CBC Latest Ref Rng & Units 05/05/2019 05/04/2019 05/03/2019  WBC 4.0 - 10.5 K/uL 7.8 8.6 13.2(H)  Hemoglobin 12.0 - 15.0 g/dL 12.7 10.9(L) 11.8(L)  Hematocrit 36.0 - 46.0 % 38.1 32.8(L) 35.3(L)  Platelets 150 - 400 K/uL 473(H) 391 370   MRI LUMBAR SPINE WITHOUT AND WITH CONTRAST  TECHNIQUE: Multiplanar and multiecho pulse sequences of the lumbar spine were obtained without and with intravenous contrast.  CONTRAST:  10 mL Gadavist  COMPARISON:  02/24/2019  FINDINGS: Segmentation:  Normal  Alignment:  Grade 1 anterolisthesis at L5-S1, unchanged  Vertebrae: No acute compression fracture, discitis-osteomyelitis or epidural collection. There is edema and contrast enhancement within the left sacral ala (3:14, 7:14).  Conus medullaris and cauda equina: Conus extends to the L1 level. Mild clumping of nerve roots at the L4-5 level posteriorly.  Paraspinal and other soft tissues: There is abnormal contrast  enhancement within the paraspinous muscles adjacent to the left sacrum.  Disc levels:  The T11-L3 disc levels are normal.  L3-4: Small disc bulge without spinal canal stenosis. Mild bilateral neural foraminal stenosis, left-greater-than-right, unchanged.  L4-5: Intermediate disc bulge with mild facet hypertrophy. Mild spinal canal stenosis, unchanged. There is thickening of the filum terminale at this level.  L5-S1: Mild disc bulge with severe facet hypertrophy. No spinal canal stenosis. There is grade 1 anterolisthesis with mild right and moderate left neural foraminal stenosis, unchanged. There is abnormal contrast enhancement within the left neural foramen. Previously seen epidural phlegmon has resolved.  IMPRESSION: 1. Improved appearance of infection within the left paraspinous musculature at the L5-S1 level compared [id number]. No epidural abscess. 2. Persistent abnormal contrast enhancement within the left-greater-than-right L5-S1 facet joints and within the left sacral alae, consistent with osteomyelitis. 3. Abnormal contrast enhancement within the left neural foramen, unchanged. 4. Mild L4-5 spinal canal stenosis. 5. Grade 1 anterolisthesis at L5-S1 with mild right, moderate left neural foraminal stenosis.  ULTRASOUND LEFT UPPER EXTREMITY LIMITED  TECHNIQUE: Ultrasound examination of the upper extremity soft tissues was performed  in the area of clinical concern.  COMPARISON:  None  FINDINGS: Real-time sonography of the left forearm was performed. Severe soft tissue swelling along the anterior aspect of the distal forearm. Complex fluid collection measuring 3.9 x 1.6 x 3 cm concerning for an abscess.  IMPRESSION: 1. Cellulitis of the distal forearm with a 3.9 x 1.6 x 3 cm abscess in the distal anterior forearm.  Discharge Instructions: [facility] Solution to the Opioid Problem (GCSTOP) Fixed; mobile; peer-based Chase Holleman [phone]  [email or web address] Fixed site exchange at [facility], [address] [place], [place] [address] on Wednesdays (2:00 - 5:00 pm) and Thursdays (4:00 - 8:00 pm). Pop-up mobile exchange locations: [employer], [place], [place] [address] on Tuesdays (11:00 am - 1:00 pm) and Fridays (11:00 am - 1:00 pm) [place], [address], [place], [place] [address] on Tuesdays (2:00 - 4:00 pm) and Fridays (2:00 - 4:00 pm)  [facility] ([facility]), [address] [place], [place] [address] on Thursdays (11:00 am - 3:00 pm)  Needle exchange information: [facility] - also serves Public relations account executive and [employer] Fixed; mobile; peer-based Rosemary Holms [phone] [email or web address] [address] [place], [place] [address] Delivery and outreach available in [place] and [place], please call for more information. [employer] ([facility] outreach in [place] and [place]) Knina Strichariz [phone]  [email or web address] Exchange available 2:00 - 8:00 pm (except Wednesdays). Please call or text for more information.  Discharge Instructions    Call MD for:  difficulty breathing, headache or visual disturbances   Complete by:  As directed    Call MD for:  extreme fatigue   Complete by:  As directed    Call MD for:  hives   Complete by:  As directed    Call MD for:  persistant dizziness or light-headedness   Complete by:  As directed    Call MD for:  persistant nausea and vomiting   Complete by:  As directed    Call MD for:  redness, tenderness, or signs of infection (pain, swelling, redness, odor or green/yellow discharge around incision site)   Complete by:  As directed    Call MD for:  severe uncontrolled pain   Complete by:  As directed    Call MD for:  temperature >100.4   Complete by:  As directed    Diet - low sodium heart healthy   Complete by:  As directed     Discharge instructions   Complete by:  As directed    Take levaquin 1 tablet a day and doxycycline 1 tablet twice daily for the next 6 weeks to help clear your infection. Please follow up with your PCP within 1 week.  If you start having fevers, nausea, vomiting, worsening pain, dizziness please seek care at a physician's office, urgent care or ED.   Increase activity slowly   Complete by:  As directed       Signed: [name], MD 05/05/2019, 1:26 PM   Pager: [phone]

## 2019-05-05 NOTE — Progress Notes (Signed)
Pharmacy Antibiotic Note  [name] is a 33 y.o. female admitted on 05/02/2019 with wound infection.  Patient injected heroin into the wrist on 05/01/19.  Pharmacy has been consulted for Vancomycin dosing for sepsis/cellulitis along with Rocephin per MD.   The patient's SCr has risen slightly requiring a Vancomycin dose adjustment. Noted spine MRI that showed persistent osteomyelitis and L-forearm abscess cultures still pending.  Vancomycin 750 mg IV Q 12 hrs. Goal AUC 400-550. Expected AUC: 444.4 SCr used: 0.94   Plan: - Reduce Vancomycin to 750 mg IV every 12 hours - Continue Rocephin per MD - Will continue to follow renal function, culture results, LOT, and antibiotic de-escalation plans    Height: 5' 7.99" (172.7 cm) Weight: 196 lb 10.4 oz (89.2 kg) IBW/kg (Calculated) : 63.88  Temp (24hrs), Avg:98.3 F (36.8 C), Min:98.1 F (36.7 C), Max:98.6 F (37 C)  Recent Labs  Lab 05/02/19 0559 05/03/19 0451 05/04/19 0627 05/05/19 0542  WBC 18.0* 13.2* 8.6 7.8  CREATININE 0.92 0.75 0.73 0.94  LATICACIDVEN 1.0  --   --   --     Estimated Creatinine Clearance: 99.4 mL/min (by C-G formula based on SCr of 0.94 mg/dL).    Allergies  Allergen Reactions  . Penicillins Cross Reactors Hives    Has patient had a PCN reaction causing immediate rash, facial/tongue/throat swelling, SOB or lightheadedness with hypotension: Yes Has patient had a PCN reaction causing severe rash involving mucus membranes or skin necrosis: Yes Has patient had a PCN reaction that required hospitalization unknown Has patient had a PCN reaction occurring within the last 10 years: unknown If all of the above answers are "NO", then may proceed with Cephalosporin use. No issues with cephalosporins     Vanc 6/1 >> CTX 6/1 >>  6/1 COVID >> neg  6/1 BCx >> ngtd 6/1 MRSA PCR  >> neg 6/1 L-forearm abscess >> GS w/ GPC + GNR (last updated 6/3)  Thank you for allowing pharmacy to be a part of this patient's  care.  Alycia Rossetti, PharmD, BCPS Clinical Pharmacist Clinical phone for 05/05/2019: [phone] 05/05/2019 8:43 AM   **Pharmacist phone directory can now be found on [email or web address] ([email or web address]).  Listed under [place].

## 2019-05-05 NOTE — Progress Notes (Signed)
Patient leaving AMA. IV taken out, and MD made aware.   Farley Ly RN

## 2019-05-05 NOTE — Progress Notes (Signed)
Subjective:  [name] is a 33 y.o. F with PMH of unstable housing, polysubastance use, and lumbar septic arthritis admit for forearm abscess on hospital day 3  [name] was examined at bedside today. She states she did not have a good night. She mentions she is having no pain except for her surgery site and requests that we do not examine her surgical site. She mentions nausea, episode of diarrhea overnight as well as hot flashes with sweating and poor appetite. Informed her that she had nausea and additional suboxone doses ordered as needed and she can ask for more medications if she needs them.  Objective:  Vital signs in last 24 hours: Vitals:   05/04/19 0905 05/04/19 1728 05/04/19 2049 05/05/19 0548  BP: (!) 143/72 (!) 171/82 (!) 189/88 (!) 169/72  Pulse: 62 67 75 70  Resp: '18 18 18 18  '$ Temp: 98.1 F (36.7 C) 98.6 F (37 C) 98.4 F (36.9 C) 98.1 F (36.7 C)  TempSrc: Oral Oral  Oral  SpO2: 100% 100% 100% 100%  Weight:   89.2 kg   Height:       Gen: Well-developed, well nourished, NAD HEENT: Slightly larger pupils, equil and reactive to light, No nasal discharge, MMM Neck: supple, ROM intact CV: RRR, S1, S2 normal, No rubs, no murmurs, no gallops Pulm: CTAB, No rales, no wheezes Abd: Soft, BS+, NTND, No rebound, no guarding Extm: ROM intact, Peripheral pulses intact, No peripheral edema Skin: + diaphoresis, Warm, normal turgor, did not allow for examination of surgical site Neuro: AAOx3, Not tremulous Psych: Normal mood and affect  Assessment/Plan:  Principal Problem:   Abscess of wrist Active Problems:   Polysubstance dependence including opioid type drug, continuous use (HCC)   Cellulitis of left upper extremity  [name] is a 33 yo F w/ PMH of unstable housing, polysubstance use and hx of lumbar spine septic arthritis presenting to ED with wrist pain 2/2 forearm cellulitis with abscess. Her wound culture has not resulted in speciation yet. Discussion at  curbside with ID mentions considering 6 week course of oral antibiotics again. Will await speciation for narrower antibiotics. Afebrile overnight with resolution of her leukocytosis. She was started on suboxone and is exhibiting withdrawal symptoms but have not yet received her morning dose. Will be at full dose starting this morning.  Forearm pain and swelling 2/2 cellulitis w/ abscess Hx of polysubstance abuse including IV drug use. Left forearm pain significantly improved. No loss of sensation.WBC 18->13.2->8.6->7.8 Afebrile. BP 135/70.Dressing changes per hand surgery. Gram stain of wound culture shows gram + cocci, gram - rods. Culture re-incubated for better growth. Blood culture NGTD - Appreciate hand surgery recs - F/u wound culture/blood cultures - C/w vancomycin IV, ceftriaxone - Will need TTE/TEE if blood culture positive - Scheduled ibuprofen for pain  Hx of septic L5-S1 facet arthritis Discharged w/ 6 week course of doxy, levo on 02/26/19. MRI spine: L5-S1 osteomyelitis. ESR 40, CRP 5.8. Consistent with mild inflammatory process. She will need prolonged oral antibiotic course to clear the osteomyelitis. Will need ID support to obtain antibiotics for pt at discharge. - F/u blood cultures - c/w antibiotic therapy as above  Hx of polysubstances abuse hx of heroin, crack and meth. COWS this am at 10 points. Started on low dose suboxone yesterday.  - Monitor for withdrawal symptoms - C/w suboxone 8-'2mg'$  BID (COWS q4hr, Suboxone '2mg'$  now and up-titrate depending on COWS score overnight to '8mg'$  tomorrow) - Will need outpatient script and  follow up appointment at discharge  DVT prophx: Lovenox Diet: Regular Bowel: Senokot Code: Full  Dispo: Anticipated discharge in approximately 0-1 day(s).   Anne Anis, MD 05/05/2019, 6:30 AM Pager: [phone]

## 2019-05-05 NOTE — Progress Notes (Signed)
Per patient's nurse patient indicated that she does not want to be bother.  Chaplain available as needed.  Jaclynn Major, [place], [facility], Pager [phone]

## 2019-05-06 MED FILL — DOXYCYCLINE HYCLATE 100 MG: 100 | 45 days supply | Qty: 90 | Fill #0

## 2019-05-07 LAB — AEROBIC/ANAEROBIC CULTURE W GRAM STAIN (SURGICAL/DEEP WOUND)

## 2019-05-07 LAB — CULTURE, BLOOD (ROUTINE X 2)
Culture: NO GROWTH
Culture: NO GROWTH
Special Requests: ADEQUATE
Special Requests: ADEQUATE

## 2020-06-07 ENCOUNTER — Other Ambulatory Visit: Payer: Self-pay

## 2020-06-07 ENCOUNTER — Emergency Department (HOSPITAL_COMMUNITY)
Admission: EM | Admit: 2020-06-07 | Discharge: 2020-06-08 | Disposition: A | Discharge status: Home / Self Care | Payer: No Typology Code available for payment source | Attending: Emergency Medicine | Admitting: Emergency Medicine

## 2020-06-07 DIAGNOSIS — F1910 Other psychoactive substance abuse, uncomplicated: Secondary | ICD-10-CM | POA: Insufficient documentation

## 2020-06-07 DIAGNOSIS — F17210 Nicotine dependence, cigarettes, uncomplicated: Secondary | ICD-10-CM | POA: Insufficient documentation

## 2020-06-07 DIAGNOSIS — D75A Glucose-6-phosphate dehydrogenase (G6PD) deficiency without anemia: Secondary | ICD-10-CM | POA: Diagnosis present

## 2020-06-07 DIAGNOSIS — R4182 Altered mental status, unspecified: Secondary | ICD-10-CM | POA: Diagnosis not present

## 2020-06-07 LAB — CBC
HCT: 36 % (ref 36.0–46.0)
Hemoglobin: 11.9 g/dL — ABNORMAL LOW (ref 12.0–15.0)
MCH: 28.1 pg (ref 26.0–34.0)
MCHC: 33.1 g/dL (ref 30.0–36.0)
MCV: 84.9 fL (ref 80.0–100.0)
Platelets: 384 10*3/uL (ref 150–400)
RBC: 4.24 MIL/uL (ref 3.87–5.11)
RDW: 13 % (ref 11.5–15.5)
WBC: 9.9 10*3/uL (ref 4.0–10.5)
nRBC: 0 % (ref 0.0–0.2)

## 2020-06-07 LAB — I-STAT BETA HCG BLOOD, ED (MC, WL, AP ONLY): I-stat hCG, quantitative: <5 m[IU]/mL (ref <5)

## 2020-06-07 NOTE — ED Triage Notes (Signed)
Arrived by [employer]. Initially taken to jail. Patient brought to this facility because she is "tweaking" and they wanted he ro get checked out.

## 2020-06-07 NOTE — ED Triage Notes (Signed)
Patient needs to be seen and medically cleared to go to jail.

## 2020-06-08 ENCOUNTER — Other Ambulatory Visit: Payer: Self-pay

## 2020-06-08 LAB — COMPREHENSIVE METABOLIC PANEL
ALT: 20 U/L (ref 0–44)
AST: 24 U/L (ref 15–41)
Albumin: 4 g/dL (ref 3.5–5.0)
Alkaline Phosphatase: 47 U/L (ref 38–126)
Anion gap: 11 (ref 5–15)
BUN: 11 mg/dL (ref 6–20)
CO2: 24 mmol/L (ref 22–32)
Calcium: 9.2 mg/dL (ref 8.9–10.3)
Chloride: 105 mmol/L (ref 98–111)
Creatinine, Ser: 0.96 mg/dL (ref 0.44–1.00)
GFR calc Af Amer: >60 mL/min (ref >60)
GFR calc non Af Amer: >60 mL/min (ref >60)
Glucose, Bld: 114 mg/dL — ABNORMAL HIGH (ref 70–99)
Potassium: 3.9 mmol/L (ref 3.5–5.1)
Sodium: 140 mmol/L (ref 135–145)
Total Bilirubin: 0.7 mg/dL (ref 0.3–1.2)
Total Protein: 8.1 g/dL (ref 6.5–8.1)

## 2020-06-08 LAB — RAPID URINE DRUG SCREEN, HOSP PERFORMED
Amphetamines: POSITIVE — AB
Barbiturates: NOT DETECTED
Benzodiazepines: POSITIVE — AB
Cocaine: POSITIVE — AB
Opiates: POSITIVE — AB
Tetrahydrocannabinol: NOT DETECTED

## 2020-06-08 LAB — ETHANOL: Alcohol, Ethyl (B): <10 mg/dL (ref <10)

## 2020-06-08 NOTE — ED Notes (Signed)
Patient walked to bathroom without assistance with a steady gait. Patient given soda to drink.

## 2020-06-08 NOTE — ED Provider Notes (Signed)
[place] DEPT Provider Note   CSN: [id number] Arrival date & time: 06/07/20  2310     History Chief Complaint  Patient presents with   Medical Clearance    [name] is a 34 y.o. female.  HPI   34 year old female with a history of chronic ankle pain, anxiety/depression, hep C, polysubstance abuse, PTSD, who presents to the emergency department today with [employer].  Patient was arrested prior to arrival for methamphetamine and ecstasy possession.  She admitted to using heroin and meth earlier today.  She was initially taken to jail but she was "tweaking "and they brought her here for medical clearance.  Level 5 caveat as patient is obviously intoxicated, though she does not have any specific complaints on my evaluation.  Past Medical History:  Diagnosis Date   Ankle pain, chronic    Anxiety    Depression    Depression with anxiety    Dilated bile duct 07/2017   CBD up to 11 mm on ultrasound and MRCP.  no choledocholithiasis or gallstones.     [name] antibody positive in blood 07/2017   Polysubstance abuse ([place])    PTSD (post-traumatic stress disorder)     Patient Active Problem List   Diagnosis Date Noted   Abscess of wrist 05/02/2019   Cellulitis of left upper extremity 05/02/2019   Severe opioid use disorder ([place]) 02/26/2019   IVDU (intravenous drug user) 02/25/2019   Septic joint ([place]) 02/24/2019   Chronic [name] ([place]) 03/30/2013   Paraproteinemia 03/28/2013   Polysubstance dependence including opioid type drug, continuous use ([place]) 06/22/2012    Class: Chronic   Substance induced mood disorder ([place]) 06/22/2012    Class: Chronic   Smokes tobacco daily 01/28/2007   Depression 01/28/2007    Past Surgical History:  Procedure Laterality Date   ANKLE SURGERY     APPENDECTOMY     EUS Right 07/20/2017   Procedure: ESOPHAGEAL ENDOSCOPIC ULTRASOUND (EUS) RADIAL;  Surgeon: Milus Banister, MD;  [facility]  ENDOSCOPY;  Service: Endoscopy;  Laterality: Right;   I & D EXTREMITY Left 05/02/2019   Procedure: IRRIGATION AND DEBRIDEMENT LEFT FOREARM;  Surgeon: Dayna Barker, MD;  Location: [facility];  Service: Plastics;  Laterality: Left;   TUBAL LIGATION     2009   TUBAL LIGATION       OB History   No obstetric history on file.     Family History  Family history unknown: Yes    Social History   Tobacco Use   Smoking status: Current Every Day Smoker    Packs/day: 0.50    Years: 16.00    Pack years: 8.00    Types: Cigarettes   Smokeless tobacco: Never Used  Scientific laboratory technician Use: Never used  Substance Use Topics   Alcohol use: No    Comment: occasionally   Drug use: Yes    Types: Heroin, Cocaine, Methamphetamines    Comment: heroin    Home Medications Prior to Admission medications   Medication Sig Start Date End Date Taking? Authorizing Provider  buprenorphine-naloxone (SUBOXONE) 8-2 mg SUBL SL tablet Place 1 tablet under the tongue 2 (two) times daily. Patient not taking: Reported on 06/08/2020 05/05/19   Axel Filler, MD  doxycycline (VIBRA-TABS) 100 MG tablet Take 1 tablet (100 mg total) by mouth 2 (two) times daily. Patient not taking: Reported on 06/08/2020 05/05/19   [name], MD  levofloxacin (LEVAQUIN) 750 MG tablet Take 1 tablet (  750 mg total) by mouth daily. Patient not taking: Reported on 06/08/2020 05/05/19   [name], MD    Allergies    Penicillins cross reactors  Review of Systems   Review of Systems  Unable to perform ROS: Mental status change    Physical Exam Updated Vital Signs BP 123/78 (BP Location: Right Arm)    Pulse 78    Temp 98.2 F (36.8 C) (Oral)    Resp 14    Ht 5\' 8"  (1.727 m)    Wt 81.6 kg    SpO2 100%    BMI 27.37 kg/m   Physical Exam Vitals and nursing note reviewed.  Constitutional:      General: She is not in acute distress.    Appearance: She is well-developed.  HENT:     Head: Normocephalic and atraumatic.   Eyes:     Conjunctiva/sclera: Conjunctivae normal.  Cardiovascular:     Rate and Rhythm: Normal rate and regular rhythm.     Heart sounds: No murmur heard.   Pulmonary:     Effort: Pulmonary effort is normal. No respiratory distress.     Breath sounds: Normal breath sounds.  Abdominal:     Palpations: Abdomen is soft.     Tenderness: There is no abdominal tenderness.  Musculoskeletal:     Cervical back: Neck supple.  Skin:    General: Skin is warm and dry.  Neurological:     Mental Status: She is alert.     Comments: Patient intermittently falling asleep during exam but arouses easily to voice.  not oriented to date.  Oriented to self and location.  Psychiatric:     Comments: Patient inappropriately laughing throughout exam.     ED Results / Procedures / Treatments   Labs (all labs ordered are listed, but only abnormal results are displayed) Labs Reviewed  COMPREHENSIVE METABOLIC PANEL - Abnormal; Notable for the following components:      Result Value   Glucose, Bld 114 (*)    All other components within normal limits  CBC - Abnormal; Notable for the following components:   Hemoglobin 11.9 (*)    All other components within normal limits  RAPID URINE DRUG SCREEN, HOSP PERFORMED - Abnormal; Notable for the following components:   Opiates POSITIVE (*)    Cocaine POSITIVE (*)    Benzodiazepines POSITIVE (*)    Amphetamines POSITIVE (*)    All other components within normal limits  ETHANOL  I-STAT BETA HCG BLOOD, ED ([facility], [facility], [facility] ONLY)    EKG None  Radiology No results found.  Procedures Procedures (including critical care time)  Medications Ordered in ED Medications - No data to display  ED Course  I have reviewed the triage vital signs and the nursing notes.  Pertinent labs & imaging results that were available during my care of the patient were reviewed by me and considered in my medical decision making (see chart for details).    MDM  Rules/Calculators/A&P                          34 year old female presenting for evaluation of altered mental status.  Was arrested by police for drug possession prior to arrival and they brought her here due to her altered mental status.  She did admit to using heroin and meth earlier today.  She was also found to have methamphetamines and ecstasy on her person at the time of arrest.  On my evaluation  she is moving all extremities with clear speech.  She does appear intoxicated however she is and full to answer some questions appropriately though she is disoriented to time.  CBC with mild anemia, history of same.  No leukocytosis. CMP nonacute EtOH negative. Beta-hCG negative UDS positive for cocaine, opiates, benzos, amphetamines.  Likely contributing to her altered mental status.  Patient more awake/alert.  Able to ambulate to the bathroom.  Able to tolerate p.o.  Do suspect altered mental status secondary to multiple substance ingestions.  Basically is returned to baseline at this time and is appropriate for discharge back to jail.  Final Clinical Impression(s) / ED Diagnoses Final diagnoses:  Polysubstance abuse [facility])    Rx / [place] Orders ED Discharge Orders    None       Rodney Booze, PA-C 06/08/20 0440    Ezequiel Essex, MD 06/08/20 [phone]

## 2020-06-08 NOTE — ED Notes (Signed)
Patient able to ambulate to the bathroom by herself.

## 2020-06-08 NOTE — ED Notes (Signed)
Pt resting comfortably

## 2021-10-23 ENCOUNTER — Ambulatory Visit (HOSPITAL_COMMUNITY)
Admission: EM | Admit: 2021-10-23 | Discharge: 2021-10-23 | Disposition: A | Discharge status: Home / Self Care | Payer: No Typology Code available for payment source | Attending: Physician Assistant | Admitting: Physician Assistant

## 2021-10-23 ENCOUNTER — Other Ambulatory Visit: Payer: Self-pay

## 2021-10-23 ENCOUNTER — Encounter (HOSPITAL_COMMUNITY): Payer: Self-pay | Admitting: Emergency Medicine

## 2021-10-23 DIAGNOSIS — M25561 Pain in right knee: Secondary | ICD-10-CM

## 2021-10-23 DIAGNOSIS — M25469 Effusion, unspecified knee: Secondary | ICD-10-CM | POA: Diagnosis not present

## 2021-10-23 MED ORDER — KETOROLAC TROMETHAMINE 30 MG/ML IJ SOLN
INTRAMUSCULAR | Status: AC
  Filled 2021-10-23: qty 1

## 2021-10-23 MED ORDER — DOXYCYCLINE HYCLATE 100 MG PO TABS
100.0000 mg | ORAL_TABLET | Freq: Two times a day (BID) | ORAL | 0 refills | Status: AC
Start: 2021-10-23 — End: ?

## 2021-10-23 MED ORDER — KETOROLAC TROMETHAMINE 30 MG/ML IJ SOLN
30.0000 mg | Freq: Once | INTRAMUSCULAR | Status: AC
  Administered 2021-10-23: 30 mg via INTRAMUSCULAR

## 2021-10-23 NOTE — ED Provider Notes (Signed)
[place]    CSN: [id number] Arrival date & time: 10/23/21  1941      History   Chief Complaint Chief Complaint  Patient presents with   Knee Pain    Right    HPI [name] is a 35 y.o. female.   Patient presents today with a 1 week history of worsening right knee pain.  She denies any known injury or increase in activity prior to symptom onset.  She has tried over-the-counter analgesics without improvement of symptoms.  Reports significant swelling and erythema prompting evaluation today.  Denies any fever but does report subjectively feeling hot as well as some nausea but denies any vomiting.  She has not tried additional over-the-counter medication for symptom management.  She does report using needle in that line prior to symptom onset for intravenous drug use but did not inject the drug vicinity of the knee.  She does have a history of septic arthritis but states current symptoms are not similar to previous episodes of this condition.  Denies chest pain or shortness of breath.  Denies history of VTE event.   Past Medical History:  Diagnosis Date   Ankle pain, chronic    Anxiety    Depression    Depression with anxiety    Dilated bile duct 07/2017   CBD up to 11 mm on ultrasound and MRCP.  no choledocholithiasis or gallstones.     [name] antibody positive in blood 07/2017   Polysubstance abuse ([place])    PTSD (post-traumatic stress disorder)     Patient Active Problem List   Diagnosis Date Noted   Abscess of wrist 05/02/2019   Cellulitis of left upper extremity 05/02/2019   Severe opioid use disorder ([place]) 02/26/2019   IVDU (intravenous drug user) 02/25/2019   Septic joint ([place]) 02/24/2019   Chronic [name] ([place]) 03/30/2013   Paraproteinemia 03/28/2013   Polysubstance dependence including opioid type drug, continuous use ([place]) 06/22/2012    Class: Chronic   Substance induced mood disorder ([place]) 06/22/2012    Class: Chronic   Smokes tobacco  daily 01/28/2007   Depression 01/28/2007    Past Surgical History:  Procedure Laterality Date   ANKLE SURGERY     APPENDECTOMY     EUS Right 07/20/2017   Procedure: ESOPHAGEAL ENDOSCOPIC ULTRASOUND (EUS) RADIAL;  Surgeon: Milus Banister, MD;  [facility] ENDOSCOPY;  Service: Endoscopy;  Laterality: Right;   I & D EXTREMITY Left 05/02/2019   Procedure: IRRIGATION AND DEBRIDEMENT LEFT FOREARM;  Surgeon: Dayna Barker, MD;  Location: [place];  Service: Plastics;  Laterality: Left;   TUBAL LIGATION     2009   TUBAL LIGATION      OB History   No obstetric history on file.      Home Medications    Prior to Admission medications   Medication Sig Start Date End Date Taking? Authorizing Provider  buprenorphine-naloxone (SUBOXONE) 8-2 mg SUBL SL tablet Place 1 tablet under the tongue 2 (two) times daily. Patient not taking: Reported on 06/08/2020 05/05/19   Axel Filler, MD  doxycycline (VIBRA-TABS) 100 MG tablet Take 1 tablet (100 mg total) by mouth 2 (two) times daily. 10/23/21   Kathlene Yano, Derry Skill, PA-C  levofloxacin (LEVAQUIN) 750 MG tablet Take 1 tablet (750 mg total) by mouth daily. Patient not taking: Reported on 06/08/2020 05/05/19   [name], MD    Family History Family History  Family history unknown: Yes    Social History Social  History   Tobacco Use   Smoking status: Every Day    Packs/day: 0.50    Years: 16.00    Pack years: 8.00    Types: Cigarettes   Smokeless tobacco: Never  Vaping Use   Vaping Use: Never used  Substance Use Topics   Alcohol use: No    Comment: occasionally   Drug use: Yes    Types: Heroin, Cocaine, Methamphetamines    Comment: heroin     Allergies   Penicillins cross reactors   Review of Systems Review of Systems  Constitutional:  Positive for activity change and fatigue. Negative for appetite change and fever.  Respiratory:  Negative for cough and shortness of breath.   Cardiovascular:  Negative for chest pain.   Gastrointestinal:  Negative for abdominal pain, diarrhea, nausea and vomiting.  Musculoskeletal:  Positive for arthralgias and joint swelling. Negative for myalgias.  Skin:  Positive for color change.  Neurological:  Negative for dizziness, light-headedness and headaches.    Physical Exam Triage Vital Signs ED Triage Vitals  Enc Vitals Group     BP 10/23/21 1955 104/79     Pulse Rate 10/23/21 1955 (!) 101     Resp 10/23/21 1955 17     Temp 10/23/21 1955 98.6 F (37 C)     Temp Source 10/23/21 1955 Oral     SpO2 10/23/21 1955 96 %     Weight --      Height --      Head Circumference --      Peak Flow --      Pain Score 10/23/21 1953 7     Pain Loc --      Pain Edu? --      Excl. in [place]? --    No data found.  Updated Vital Signs BP 104/79 (BP Location: Left Arm)   Pulse (!) 101   Temp 98.6 F (37 C) (Oral)   Resp 17   SpO2 96%   Visual Acuity Right Eye Distance:   Left Eye Distance:   Bilateral Distance:    Right Eye Near:   Left Eye Near:    Bilateral Near:     Physical Exam Vitals reviewed.  Constitutional:      General: She is awake. She is not in acute distress.    Appearance: Normal appearance. She is well-developed. She is not ill-appearing.     Comments: Pleasant female appears stated age no acute distress sitting comfortably in exam room  HENT:     Head: Normocephalic and atraumatic.  Cardiovascular:     Rate and Rhythm: Normal rate and regular rhythm.     Heart sounds: Normal heart sounds, S1 normal and S2 normal. No murmur heard.    Comments: No significant edema.  Negative Homans' sign on right. Pulmonary:     Effort: Pulmonary effort is normal.     Breath sounds: Normal breath sounds. No wheezing, rhonchi or rales.     Comments: Clear to auscultation bilaterally Abdominal:     Palpations: Abdomen is soft.     Tenderness: There is no abdominal tenderness.  Musculoskeletal:     Right knee: Swelling present. No deformity or bony tenderness.  Decreased range of motion. Tenderness present over the lateral joint line. No medial joint line tenderness. No LCL laxity, MCL laxity, ACL laxity or PCL laxity.     Right lower leg: No edema.     Left lower leg: No edema.     Comments: Right knee: Moderate  effusion noted with significant swelling at lateral portion of right knee.  No deformity noted.  No bony tenderness on exam.  No ligamentous laxity on exam.  Psychiatric:        Behavior: Behavior is cooperative.     UC Treatments / Results  Labs (all labs ordered are listed, but only abnormal results are displayed) Labs Reviewed - No data to display  EKG   Radiology No results found.  Procedures Procedures (including critical care time)  Medications Ordered in UC Medications  ketorolac (TORADOL) 30 MG/ML injection 30 mg (30 mg Intramuscular Given 10/23/21 2024)    Initial Impression / Assessment and Plan / UC Course  I have reviewed the triage vital signs and the nursing notes.  Pertinent labs & imaging results that were available during my care of the patient were reviewed by me and considered in my medical decision making (see chart for details).     Vital signs and physical exam are reassuring today.  No indication for plain film as patient has no bony tenderness or recent trauma.  Discussed that we do not have capabilities of ruling out a blood clot or definitively diagnosing septic joint and the safest thing to do would be to go to the emergency room.  Patient was not interested in doing this so we will empirically treat with doxycycline 100 mg.  Discussed that if within the next 24 hours she does not have significant improvement of symptoms or if at any point anything worsens and she develops a fever, increased swelling, increased pain she must go to the ER to which she expressed understanding.  She was given Toradol injection today for pain relief after reporting last dose of NSAIDs was earlier this morning.  Recommended  she use over-the-counter medications such as Tylenol for symptom relief.  She can use elevation, ice, compression for additional symptom relief.  Discussed that it is important she follows up with orthopedics as soon as possible for further evaluation and possible joint aspiration and was given contact information for local provider.  Had a very long discussion about signs/symptoms that warrant going to the emergency room including severe pain, increased swelling, leg swelling, shortness of breath, chest discomfort.  Strict return precautions given to which she expressed understanding.  Final Clinical Impressions(s) / UC Diagnoses   Final diagnoses:  Acute pain of right knee  Knee swelling     Discharge Instructions      Take doxycycline 100 mg twice daily.  Please follow-up with orthopedics first thing Friday morning.  As we discussed, if you have any worsening symptoms including severe pain, swelling, fever you need to go to the emergency room.    ED Prescriptions     Medication Sig Dispense Auth. Provider   doxycycline (VIBRA-TABS) 100 MG tablet Take 1 tablet (100 mg total) by mouth 2 (two) times daily. 30 tablet Jamonta Goerner, Derry Skill, PA-C      PDMP not reviewed this encounter.   Terrilee Croak, PA-C 10/23/21 2037

## 2021-10-23 NOTE — ED Triage Notes (Signed)
Pt presents with right knee pain xs 1 week. Mortin gives no relief. Denies fall or injury.

## 2021-10-23 NOTE — Discharge Instructions (Signed)
Take doxycycline 100 mg twice daily.  Please follow-up with orthopedics first thing Friday morning.  As we discussed, if you have any worsening symptoms including severe pain, swelling, fever you need to go to the emergency room.

## 2022-01-06 ENCOUNTER — Other Ambulatory Visit: Payer: Self-pay

## 2022-01-06 ENCOUNTER — Encounter (HOSPITAL_COMMUNITY): Payer: Self-pay

## 2022-01-06 ENCOUNTER — Ambulatory Visit (HOSPITAL_COMMUNITY)
Admission: EM | Admit: 2022-01-06 | Discharge: 2022-01-06 | Disposition: A | Discharge status: Home / Self Care | Payer: No Typology Code available for payment source

## 2022-01-06 DIAGNOSIS — F1990 Other psychoactive substance use, unspecified, uncomplicated: Secondary | ICD-10-CM

## 2022-01-06 DIAGNOSIS — M79642 Pain in left hand: Secondary | ICD-10-CM | POA: Diagnosis not present

## 2022-01-06 DIAGNOSIS — M7989 Other specified soft tissue disorders: Secondary | ICD-10-CM | POA: Diagnosis not present

## 2022-01-06 DIAGNOSIS — M25561 Pain in right knee: Secondary | ICD-10-CM

## 2022-01-06 NOTE — ED Triage Notes (Signed)
Pt states last time she had drugs was today.

## 2022-01-06 NOTE — Discharge Instructions (Signed)
I am concerned that you have a systemic infection given your IV drug use. This requires a higher level of care than we can provide in the urgent care setting. Please report to the hospital now for further evaluation and treatment.

## 2022-01-06 NOTE — ED Provider Notes (Signed)
[place]   MRN: [id number] DOB: 11-03-1986  Subjective:   [name] is a 36 y.o. female presenting for acute onset of left hand swelling, left hand pain, right knee pain and swelling.  Reports that she is having significant difficulty bending the knee, bearing weight on the knee.  She has also had pain and swelling of the left side of her mouth.  Has concerns about a dental abscess.  Patient does admit that she is still using IV drugs.  Last dose was today.    No current facility-administered medications for this encounter.  Current Outpatient Medications:    buprenorphine-naloxone (SUBOXONE) 8-2 mg SUBL SL tablet, Place 1 tablet under the tongue 2 (two) times daily. (Patient not taking: Reported on 06/08/2020), Disp: 14 tablet, Rfl: 0   doxycycline (VIBRA-TABS) 100 MG tablet, Take 1 tablet (100 mg total) by mouth 2 (two) times daily., Disp: 30 tablet, Rfl: 0   levofloxacin (LEVAQUIN) 750 MG tablet, Take 1 tablet (750 mg total) by mouth daily. (Patient not taking: Reported on 06/08/2020), Disp: 45 tablet, Rfl: 0   Allergies  Allergen Reactions   Penicillins Cross Reactors Hives    Has patient had a PCN reaction causing immediate rash, facial/tongue/throat swelling, SOB or lightheadedness with hypotension: Yes Has patient had a PCN reaction causing severe rash involving mucus membranes or skin necrosis: Yes Has patient had a PCN reaction that required hospitalization unknown Has patient had a PCN reaction occurring within the last 10 years: unknown If all of the above answers are "NO", then may proceed with Cephalosporin use. No issues with cephalosporins     Past Medical History:  Diagnosis Date   Ankle pain, chronic    Anxiety    Depression    Depression with anxiety    Dilated bile duct 07/2017   CBD up to 11 mm on ultrasound and MRCP.  no choledocholithiasis or gallstones.     [name] antibody positive in blood 07/2017   Polysubstance abuse ([place])     PTSD (post-traumatic stress disorder)      Past Surgical History:  Procedure Laterality Date   ANKLE SURGERY     APPENDECTOMY     EUS Right 07/20/2017   Procedure: ESOPHAGEAL ENDOSCOPIC ULTRASOUND (EUS) RADIAL;  Surgeon: Milus Banister, MD;  Location: [facility] ENDOSCOPY;  Service: Endoscopy;  Laterality: Right;   I & D EXTREMITY Left 05/02/2019   Procedure: IRRIGATION AND DEBRIDEMENT LEFT FOREARM;  Surgeon: Dayna Barker, MD;  Location: [place];  Service: Plastics;  Laterality: Left;   TUBAL LIGATION     2009   TUBAL LIGATION      Family History  Family history unknown: Yes    Social History   Tobacco Use   Smoking status: Every Day    Packs/day: 0.50    Years: 16.00    Pack years: 8.00    Types: Cigarettes   Smokeless tobacco: Never  Vaping Use   Vaping Use: Never used  Substance Use Topics   Alcohol use: No    Comment: occasionally   Drug use: Yes    Types: Heroin, Cocaine, Methamphetamines    Comment: heroin    ROS   Objective:   Vitals: BP (!) 141/76 (BP Location: Right Arm)    Pulse 94    Temp 98.6 F (37 C) (Oral)    Resp 19    LMP  (Exact Date)    SpO2 100%   Physical Exam Constitutional:  General: She is not in acute distress.    Appearance: Normal appearance. She is well-developed. She is not ill-appearing, toxic-appearing or diaphoretic.  HENT:     Head: Normocephalic and atraumatic.     Nose: Nose normal.     Mouth/Throat:     Mouth: Mucous membranes are moist.  Eyes:     General: No scleral icterus.       Right eye: No discharge.        Left eye: No discharge.     Extraocular Movements: Extraocular movements intact.  Cardiovascular:     Rate and Rhythm: Normal rate.     Heart sounds: No murmur heard.   No friction rub. No gallop.  Pulmonary:     Effort: Pulmonary effort is normal. No respiratory distress.     Breath sounds: No stridor. No wheezing, rhonchi or rales.  Chest:     Chest wall: No tenderness.  Musculoskeletal:     Comments:  1+ swelling of the left hand with nodular erythematous lesions extending into the wrist and forearm.  Skin:    General: Skin is warm and dry.  Neurological:     General: No focal deficit present.     Mental Status: She is alert and oriented to person, place, and time.  Psychiatric:        Mood and Affect: Mood normal.        Behavior: Behavior normal.        Thought Content: Thought content normal.        Judgment: Judgment normal.    Assessment and Plan :   PDMP not reviewed this encounter.  1. Acute pain of right knee   2. Left hand pain   3. Swelling of left hand   4. IV drug user    Given patient's history of IV drug use and active IV drug use recommended further evaluation through the emergency room.  I am concerned that she has systemic infection given the pronounced swelling and pain of the left hand, left forearm and right knee.  She is hemodynamically stable currently, is appropriate to present to the emergency room by personal vehicle.   Jaynee Eagles, [place] 01/06/22 2105

## 2022-01-06 NOTE — ED Triage Notes (Signed)
Pt presents with L arm swelling and abscess on L side of mouth.   States she has severe R knee pain and states she is unable to extend it and presents with swelling.

## 2022-01-09 ENCOUNTER — Other Ambulatory Visit: Payer: Self-pay

## 2022-01-09 ENCOUNTER — Emergency Department (HOSPITAL_COMMUNITY)
Admission: EM | Admit: 2022-01-09 | Discharge: 2022-01-09 | Disposition: A | Discharge status: Home / Self Care | Payer: No Typology Code available for payment source | Attending: Emergency Medicine | Admitting: Emergency Medicine

## 2022-01-09 ENCOUNTER — Encounter (HOSPITAL_COMMUNITY): Payer: Self-pay | Admitting: *Deleted

## 2022-01-09 ENCOUNTER — Emergency Department (HOSPITAL_COMMUNITY): Payer: No Typology Code available for payment source

## 2022-01-09 DIAGNOSIS — Z5321 Procedure and treatment not carried out due to patient leaving prior to being seen by health care provider: Secondary | ICD-10-CM | POA: Insufficient documentation

## 2022-01-09 DIAGNOSIS — M79602 Pain in left arm: Secondary | ICD-10-CM | POA: Diagnosis not present

## 2022-01-09 DIAGNOSIS — R079 Chest pain, unspecified: Secondary | ICD-10-CM | POA: Diagnosis not present

## 2022-01-09 DIAGNOSIS — M25561 Pain in right knee: Secondary | ICD-10-CM | POA: Diagnosis not present

## 2022-01-09 LAB — URINALYSIS, ROUTINE W REFLEX MICROSCOPIC
Bilirubin Urine: NEGATIVE
Glucose, UA: NEGATIVE mg/dL
Ketones, ur: NEGATIVE mg/dL
Leukocytes,Ua: NEGATIVE
Nitrite: NEGATIVE
Protein, ur: NEGATIVE mg/dL
Specific Gravity, Urine: 1.014 (ref 1.005–1.030)
pH: 7 (ref 5.0–8.0)

## 2022-01-09 LAB — COMPREHENSIVE METABOLIC PANEL
ALT: 19 U/L (ref 0–44)
AST: 22 U/L (ref 15–41)
Albumin: 3.6 g/dL (ref 3.5–5.0)
Alkaline Phosphatase: 44 U/L (ref 38–126)
Anion gap: 7 (ref 5–15)
BUN: 7 mg/dL (ref 6–20)
CO2: 26 mmol/L (ref 22–32)
Calcium: 8.7 mg/dL — ABNORMAL LOW (ref 8.9–10.3)
Chloride: 105 mmol/L (ref 98–111)
Creatinine, Ser: 0.83 mg/dL (ref 0.44–1.00)
GFR, Estimated: >60 mL/min (ref >60)
Glucose, Bld: 97 mg/dL (ref 70–99)
Potassium: 3.7 mmol/L (ref 3.5–5.1)
Sodium: 138 mmol/L (ref 135–145)
Total Bilirubin: 0.3 mg/dL (ref 0.3–1.2)
Total Protein: 7.1 g/dL (ref 6.5–8.1)

## 2022-01-09 LAB — CBC WITH DIFFERENTIAL/PLATELET
Abs Immature Granulocytes: 0.02 10*3/uL (ref 0.00–0.07)
Basophils Absolute: 0.1 10*3/uL (ref 0.0–0.1)
Basophils Relative: 1 %
Eosinophils Absolute: 0.2 10*3/uL (ref 0.0–0.5)
Eosinophils Relative: 3 %
HCT: 40.9 % (ref 36.0–46.0)
Hemoglobin: 13.4 g/dL (ref 12.0–15.0)
Immature Granulocytes: 0 %
Lymphocytes Relative: 34 %
Lymphs Abs: 2.4 10*3/uL (ref 0.7–4.0)
MCH: 28.3 pg (ref 26.0–34.0)
MCHC: 32.8 g/dL (ref 30.0–36.0)
MCV: 86.5 fL (ref 80.0–100.0)
Monocytes Absolute: 0.4 10*3/uL (ref 0.1–1.0)
Monocytes Relative: 6 %
Neutro Abs: 4 10*3/uL (ref 1.7–7.7)
Neutrophils Relative %: 56 %
Platelets: 446 10*3/uL — ABNORMAL HIGH (ref 150–400)
RBC: 4.73 MIL/uL (ref 3.87–5.11)
RDW: 12.7 % (ref 11.5–15.5)
WBC: 7.1 10*3/uL (ref 4.0–10.5)
nRBC: 0 % (ref 0.0–0.2)

## 2022-01-09 LAB — PROTIME-INR
INR: 1 (ref 0.8–1.2)
Prothrombin Time: 13.1 seconds (ref 11.4–15.2)

## 2022-01-09 LAB — I-STAT BETA HCG BLOOD, ED (MC, WL, AP ONLY): I-stat hCG, quantitative: <5 m[IU]/mL (ref <5)

## 2022-01-09 LAB — APTT: aPTT: 33 seconds (ref 24–36)

## 2022-01-09 LAB — LACTIC ACID, PLASMA: Lactic Acid, Venous: 1.1 mmol/L (ref 0.5–1.9)

## 2022-01-09 NOTE — ED Provider Triage Note (Signed)
Emergency Medicine Provider Triage Evaluation Note  Valetta Close , a 36 y.o. female  was evaluated in triage.  Pt complains of knee pain forearm pain and chest pain.  Patient is admitted IV drug use with both heroin and cocaine.  She has been having severe pain in her knee and noticed that her left forearm swelled up.  She was seen in urgent care and urged to come immediately for evaluation and treatment at that time but has been holding out.  She has associated generalized malaise, fatigue..  Review of Systems  Positive: Knee pain Negative: Fever  Physical Exam  There were no vitals taken for this visit. Gen:   Awake, no distress   Resp:  Normal effort  MSK:   Moves extremities without difficulty  Other:  Swelling of her right knee, swelling of the left forearm, tachycardia  Medical Decision Making  Medically screening exam initiated at 12:12 AM.  Appropriate orders placed.  Doristine Huntley Estelle was informed that the remainder of the evaluation will be completed by another provider, this initial triage assessment does not replace that evaluation, and the importance of remaining in the ED until their evaluation is complete.  Work-up initiated   Margarita Mail, PA-C 01/09/22 0018

## 2022-01-09 NOTE — ED Triage Notes (Signed)
Pt reporting left arm pain/swelling. Also right knee pain, swelling. States hx of IV drug use. Has not felt well today, thinks she may have had a fever.

## 2022-01-09 NOTE — ED Notes (Signed)
No answer for VSx3

## 2022-01-14 LAB — CULTURE, BLOOD (SINGLE)
Culture: NO GROWTH
Special Requests: ADEQUATE

## 2022-01-14 LAB — CULTURE, BLOOD (ROUTINE X 2)
Culture: NO GROWTH
Culture: NO GROWTH
Special Requests: ADEQUATE
Special Requests: ADEQUATE

## 2022-02-05 ENCOUNTER — Encounter (HOSPITAL_COMMUNITY): Payer: Self-pay | Admitting: Radiology

## 2024-09-06 ENCOUNTER — Other Ambulatory Visit: Payer: Self-pay

## 2024-09-06 ENCOUNTER — Ambulatory Visit: Admission: EM | Admit: 2024-09-06 | Discharge: 2024-09-06 | Disposition: A | Discharge status: Home / Self Care

## 2024-09-06 ENCOUNTER — Emergency Department (HOSPITAL_COMMUNITY)
Admission: EM | Admit: 2024-09-06 | Discharge: 2024-09-07 | Discharge status: Against Medical Advice | Attending: Emergency Medicine | Admitting: Emergency Medicine

## 2024-09-06 DIAGNOSIS — M25561 Pain in right knee: Secondary | ICD-10-CM | POA: Diagnosis present

## 2024-09-06 DIAGNOSIS — M7989 Other specified soft tissue disorders: Secondary | ICD-10-CM | POA: Diagnosis not present

## 2024-09-06 DIAGNOSIS — R509 Fever, unspecified: Secondary | ICD-10-CM | POA: Insufficient documentation

## 2024-09-06 DIAGNOSIS — Z5321 Procedure and treatment not carried out due to patient leaving prior to being seen by health care provider: Secondary | ICD-10-CM | POA: Diagnosis not present

## 2024-09-06 DIAGNOSIS — M25562 Pain in left knee: Secondary | ICD-10-CM

## 2024-09-06 DIAGNOSIS — F1120 Opioid dependence, uncomplicated: Secondary | ICD-10-CM

## 2024-09-06 LAB — CBC WITH DIFFERENTIAL/PLATELET
Abs Immature Granulocytes: 0.05 K/uL (ref 0.00–0.07)
Basophils Absolute: 0.1 K/uL (ref 0.0–0.1)
Basophils Relative: 1 %
Eosinophils Absolute: 0.5 K/uL (ref 0.0–0.5)
Eosinophils Relative: 5 %
HCT: 31.8 % — ABNORMAL LOW (ref 36.0–46.0)
Hemoglobin: 10.7 g/dL — ABNORMAL LOW (ref 12.0–15.0)
Immature Granulocytes: 1 %
Lymphocytes Relative: 21 %
Lymphs Abs: 1.9 K/uL (ref 0.7–4.0)
MCH: 30.1 pg (ref 26.0–34.0)
MCHC: 33.6 g/dL (ref 30.0–36.0)
MCV: 89.3 fL (ref 80.0–100.0)
Monocytes Absolute: 0.7 K/uL (ref 0.1–1.0)
Monocytes Relative: 7 %
Neutro Abs: 6.2 K/uL (ref 1.7–7.7)
Neutrophils Relative %: 65 %
Platelets: 520 K/uL — ABNORMAL HIGH (ref 150–400)
RBC: 3.56 MIL/uL — ABNORMAL LOW (ref 3.87–5.11)
RDW: 13.9 % (ref 11.5–15.5)
WBC: 9.3 K/uL (ref 4.0–10.5)
nRBC: 0 % (ref 0.0–0.2)

## 2024-09-06 LAB — COMPREHENSIVE METABOLIC PANEL WITH GFR
ALT: 41 U/L (ref 0–44)
AST: 51 U/L — ABNORMAL HIGH (ref 15–41)
Albumin: 3.5 g/dL (ref 3.5–5.0)
Alkaline Phosphatase: 40 U/L (ref 38–126)
Anion gap: 9 (ref 5–15)
BUN: 7 mg/dL (ref 6–20)
CO2: 25 mmol/L (ref 22–32)
Calcium: 8.9 mg/dL (ref 8.9–10.3)
Chloride: 109 mmol/L (ref 98–111)
Creatinine, Ser: 0.89 mg/dL (ref 0.44–1.00)
GFR, Estimated: >60 mL/min (ref >60)
Glucose, Bld: 128 mg/dL — ABNORMAL HIGH (ref 70–99)
Potassium: 3 mmol/L — ABNORMAL LOW (ref 3.5–5.1)
Sodium: 143 mmol/L (ref 135–145)
Total Bilirubin: 0.7 mg/dL (ref 0.0–1.2)
Total Protein: 6.6 g/dL (ref 6.5–8.1)

## 2024-09-06 LAB — I-STAT CG4 LACTIC ACID, ED: Lactic Acid, Venous: 1 mmol/L (ref 0.5–1.9)

## 2024-09-06 LAB — HCG, SERUM, QUALITATIVE: Preg, Serum: NEGATIVE

## 2024-09-06 NOTE — ED Notes (Addendum)
 Patient is being discharged from the Urgent Care and sent to the Emergency Department via POV . Per Arlyss, [name], , patient is in need of higher level of care due to joint swelling, infected. Patient is aware and verbalizes understanding of plan of care.  Vitals:   09/06/24 0857  BP: 132/74  Pulse: 86  Resp: 18  Temp: 99.3 F (37.4 C)  SpO2: 95%

## 2024-09-06 NOTE — ED Triage Notes (Signed)
 Patient sent from UC for a possible infection in her right knee. Patient reports knee is red swollen and hot to the touch. She reports fevers and chills. She also reports her tongue has broken out.  Denies known injury.

## 2024-09-06 NOTE — ED Triage Notes (Signed)
 Pt reports swelling in legs, hands, face x 1 week. Pt ha snot taken any meds for complaints.

## 2024-09-06 NOTE — ED Notes (Signed)
 Pt arrives with c/o of right sided knee pain that began a week ago but has been getting worse. Pt states she went to urgent care and they sent her here for evaluation for potential infection. Pt states I'm gonna be honest, I'm an IV drug user so that's probably why it's infected. Pt reports shortness of breath denies chest pain. GCS 15.

## 2024-09-06 NOTE — ED Provider Notes (Signed)
 [facility]    CSN: [id number] Arrival date & time: 09/06/24  0836      History   Chief Complaint Chief Complaint  Patient presents with   Leg Swelling    HPI [name] is a 38 y.o. female presenting with polyarthralgias.  History polysubstance abuse, septic arthritis, chronic hepatitis.  The patient states that she was sober for several years, but recently used again, and there is concern that there was methamphetamine in the substance.  Today, she notes part of polyarthralgias and extreme sensitivity to touch.  She has concern that her right knee is either infected, or that there is a DVT.  She denies shortness of breath, chest pain, dizziness, weakness.  HPI  Past Medical History:  Diagnosis Date   Ankle pain, chronic    Anxiety    Depression    Depression with anxiety    Dilated bile duct 07/2017   CBD up to 11 mm on ultrasound and MRCP.  no choledocholithiasis or gallstones.     [name] antibody positive in blood 07/2017   Polysubstance abuse ([facility])    PTSD (post-traumatic stress disorder)     Patient Active Problem List   Diagnosis Date Noted   Abscess of wrist 05/02/2019   Cellulitis of left upper extremity 05/02/2019   Severe opioid use disorder ([facility]) 02/26/2019   IVDU (intravenous drug user) 02/25/2019   Septic joint ([facility]) 02/24/2019   Chronic [name] ([facility]) 03/30/2013   Paraproteinemia 03/28/2013   Polysubstance dependence including opioid type drug, continuous use ([facility]) 06/22/2012    Class: Chronic   Substance induced mood disorder ([facility]) 06/22/2012    Class: Chronic   Smokes tobacco daily 01/28/2007   Depression 01/28/2007    Past Surgical History:  Procedure Laterality Date   ANKLE SURGERY     APPENDECTOMY     EUS Right 07/20/2017   Procedure: ESOPHAGEAL ENDOSCOPIC ULTRASOUND (EUS) RADIAL;  Surgeon: Teressa Toribio SQUIBB, MD;  Location: [facility] ENDOSCOPY;  Service: Endoscopy;  Laterality: Right;   I & D EXTREMITY Left 05/02/2019   Procedure:  IRRIGATION AND DEBRIDEMENT LEFT FOREARM;  Surgeon: Lorretta Dess, MD;  Location: [facility] OR;  Service: Plastics;  Laterality: Left;   TUBAL LIGATION     2009   TUBAL LIGATION      OB History   No obstetric history on file.      Home Medications    Prior to Admission medications   Medication Sig Start Date End Date Taking? Authorizing Provider  doxycycline  (VIBRA -TABS) 100 MG tablet Take 1 tablet (100 mg total) by mouth 2 (two) times daily. 10/23/21   Raspet, Rocky POUR, PA-C    Family History Family History  Family history unknown: Yes    Social History Social History   Tobacco Use   Smoking status: Every Day    Current packs/day: 0.50    Average packs/day: 0.5 packs/day for 16.0 years (8.0 ttl pk-yrs)    Types: Cigarettes   Smokeless tobacco: Never  Vaping Use   Vaping status: Never Used  Substance Use Topics   Alcohol use: Yes    Comment: occasionally   Drug use: Yes    Types: Heroin, Cocaine, Methamphetamines    Comment: heroin     Allergies   Penicillins cross reactors   Review of Systems Review of Systems  Constitutional:  Negative for chills and fever.  HENT:  Negative for ear pain and sore throat.   Eyes:  Negative for pain and visual disturbance.  Respiratory:  Negative for cough and shortness of breath.   Cardiovascular:  Negative for chest pain and palpitations.  Gastrointestinal:  Negative for abdominal pain and vomiting.  Genitourinary:  Negative for dysuria and hematuria.  Musculoskeletal:  Positive for arthralgias. Negative for back pain.  Skin:  Negative for color change and rash.  Neurological:  Negative for seizures and syncope.  All other systems reviewed and are negative.    Physical Exam Triage Vital Signs ED Triage Vitals  Encounter Vitals Group     BP 09/06/24 0857 132/74     Girls Systolic BP Percentile --      Girls Diastolic BP Percentile --      Boys Systolic BP Percentile --      Boys Diastolic BP Percentile --      Pulse  Rate 09/06/24 0857 86     Resp 09/06/24 0857 18     Temp 09/06/24 0857 99.3 F (37.4 C)     Temp Source 09/06/24 0857 Oral     SpO2 09/06/24 0857 95 %     Weight --      Height --      Head Circumference --      Peak Flow --      Pain Score 09/06/24 0856 0     Pain Loc --      Pain Education --      Exclude from Growth Chart --    No data found.  Updated Vital Signs BP 132/74 (BP Location: Right Arm)   Pulse 86   Temp 99.3 F (37.4 C) (Oral)   Resp 18   SpO2 95%   Visual Acuity Right Eye Distance:   Left Eye Distance:   Bilateral Distance:    Right Eye Near:   Left Eye Near:    Bilateral Near:     Physical Exam Vitals reviewed.  Constitutional:      General: She is not in acute distress.    Appearance: Normal appearance. She is not ill-appearing or diaphoretic.     Comments: Patient is visibly uncomfortable and restless  HENT:     Head: Normocephalic and atraumatic.  Cardiovascular:     Rate and Rhythm: Normal rate and regular rhythm.     Heart sounds: Normal heart sounds.  Pulmonary:     Effort: Pulmonary effort is normal.     Breath sounds: Normal breath sounds.  Musculoskeletal:     Comments: Bilateral knees: no obvious bony deformity, effusion, skin changes. No joint laxity or crepitus. ROM flexion and extension intact.  Is extremely to touch over the knees, arms, chest, back.   Skin:    General: Skin is warm.  Neurological:     General: No focal deficit present.     Mental Status: She is alert and oriented to person, place, and time.  Psychiatric:        Mood and Affect: Mood normal.        Behavior: Behavior normal.        Thought Content: Thought content normal.        Judgment: Judgment normal.      UC Treatments / Results  Labs (all labs ordered are listed, but only abnormal results are displayed) Labs Reviewed - No data to display  EKG   Radiology No results found.  Procedures Procedures (including critical care  time)  Medications Ordered in UC Medications - No data to display  Initial Impression / Assessment and Plan / UC Course  I have  reviewed the triage vital signs and the nursing notes.  Pertinent labs & imaging results that were available during my care of the patient were reviewed by me and considered in my medical decision making (see chart for details).     Patient is a 38 year old female with a notable history of polysubstance abuse and septic arthritis.  Today, my primary concern is that this patient is in detox.  Polyarthralgias are most likely explained by this.  I have a low suspicion of septic arthritis or DVT: She is afebrile and nontachycardic, knees are not swollen, there is no erythema warmth or effusion.  It is my recommendation that she proceed to the emergency department for detox, and patient concern for septic arthritis and DVT.  She is stable for transport in [place].  Final Clinical Impressions(s) / UC Diagnoses   Final diagnoses:  Arthralgia of both knees  Polysubstance dependence including opioid type drug, continuous use [facility])     Discharge Instructions      - Please head to the emergency department for further evaluation of your joint pain and swelling.  If the joint is infected, it needs to be aspirated (drained), and antibiotics would need to be started.  This is something that must be done in the emergency department.    ED Prescriptions   None    PDMP not reviewed this encounter.   Arlyss Leita BRAVO, PA-C 09/06/24 [phone]

## 2024-09-06 NOTE — Discharge Instructions (Signed)
-   Please head to the emergency department for further evaluation of your joint pain and swelling.  If the joint is infected, it needs to be aspirated (drained), and antibiotics would need to be started.  This is something that must be done in the emergency department.

## 2024-09-07 LAB — URINALYSIS, W/ REFLEX TO CULTURE (INFECTION SUSPECTED)
Bacteria, UA: NONE SEEN
Bilirubin Urine: NEGATIVE
Glucose, UA: NEGATIVE mg/dL
Hgb urine dipstick: NEGATIVE
Ketones, ur: NEGATIVE mg/dL
Leukocytes,Ua: NEGATIVE
Nitrite: NEGATIVE
Protein, ur: NEGATIVE mg/dL
Specific Gravity, Urine: 1.025 (ref 1.005–1.030)
pH: 5 (ref 5.0–8.0)

## 2024-09-07 NOTE — ED Notes (Signed)
 Pt  was called *5 for vitals and *5 for registration did not answer any of these calls moving pt OTF
